# Patient Record
Sex: Male | Born: 1960 | Race: White | Hispanic: No | Marital: Married | Smoking: Never smoker
Health system: Southern US, Community
[De-identification: ages and names within clinical notes are randomized; demographics above are authoritative.]

## PROBLEM LIST (undated history)

## (undated) DIAGNOSIS — Z87442 Personal history of urinary calculi: Secondary | ICD-10-CM

## (undated) DIAGNOSIS — Z8619 Personal history of other infectious and parasitic diseases: Secondary | ICD-10-CM

## (undated) DIAGNOSIS — F419 Anxiety disorder, unspecified: Secondary | ICD-10-CM

## (undated) DIAGNOSIS — M1990 Unspecified osteoarthritis, unspecified site: Secondary | ICD-10-CM

## (undated) DIAGNOSIS — K219 Gastro-esophageal reflux disease without esophagitis: Secondary | ICD-10-CM

## (undated) DIAGNOSIS — M419 Scoliosis, unspecified: Secondary | ICD-10-CM

## (undated) DIAGNOSIS — F909 Attention-deficit hyperactivity disorder, unspecified type: Secondary | ICD-10-CM

## (undated) DIAGNOSIS — Z9189 Other specified personal risk factors, not elsewhere classified: Secondary | ICD-10-CM

## (undated) DIAGNOSIS — I10 Essential (primary) hypertension: Secondary | ICD-10-CM

## (undated) DIAGNOSIS — K222 Esophageal obstruction: Secondary | ICD-10-CM

## (undated) DIAGNOSIS — M25561 Pain in right knee: Secondary | ICD-10-CM

## (undated) HISTORY — PX: INGUINAL HERNIA REPAIR: SUR1180

---

## 1988-01-24 HISTORY — PX: OTHER SURGICAL HISTORY: SHX169

## 2001-01-23 HISTORY — PX: KNEE ARTHROSCOPY: SUR90

## 2001-04-11 ENCOUNTER — Encounter: Payer: Self-pay | Admitting: Orthopedic Surgery

## 2001-04-11 ENCOUNTER — Ambulatory Visit (HOSPITAL_COMMUNITY): Admission: RE | Admit: 2001-04-11 | Discharge: 2001-04-11 | Payer: Self-pay | Admitting: Orthopedic Surgery

## 2003-01-09 ENCOUNTER — Ambulatory Visit (HOSPITAL_COMMUNITY): Admission: RE | Admit: 2003-01-09 | Discharge: 2003-01-09 | Payer: Self-pay | Admitting: Gastroenterology

## 2009-07-28 ENCOUNTER — Encounter: Admission: RE | Admit: 2009-07-28 | Discharge: 2009-07-28 | Payer: Self-pay | Admitting: Family Medicine

## 2009-08-10 ENCOUNTER — Encounter: Admission: RE | Admit: 2009-08-10 | Discharge: 2009-08-10 | Payer: Self-pay | Admitting: Family Medicine

## 2009-12-29 ENCOUNTER — Encounter
Admission: RE | Admit: 2009-12-29 | Discharge: 2009-12-29 | Payer: Self-pay | Source: Home / Self Care | Admitting: Gastroenterology

## 2010-06-10 NOTE — Op Note (Signed)
NAME:  Mitchell Bell, Mitchell Bell                      ACCOUNT NO.:  [id number]   MEDICAL RECORD NO.:  [id number]                   PATIENT TYPE:  AMB   LOCATION:  ENDO                                 FACILITY:  [facility]   PHYSICIAN:  Graylin Shiver, M.D.                DATE OF BIRTH:  02/20/1960   DATE OF PROCEDURE:  01/09/2003  DATE OF DISCHARGE:                                 OPERATIVE REPORT   PROCEDURE:  Colonoscopy.   INDICATIONS FOR PROCEDURE:  Family history of colon cancer. Informed consent  was obtained after explanation of the risks of bleeding, infection and  perforation.   PREMEDICATIONS:  Fentanyl 70 micrograms IV, Versed 7 mg IV.   DESCRIPTION OF PROCEDURE:  With the patient in the left lateral decubitus  position a rectal examination  was performed and no masses were felt.   The Olympus colonoscope was inserted into the rectum and advanced  around  the colon to the cecum. The cecal landmarks were identified. The cecum and  ascending colon were normal. The transverse colon was normal. The descending  colon and sigmoid revealed a few scattered diverticula. The rectum was  normal. The patient tolerated the procedure well without complications.   IMPRESSION:  Mild diverticulosis of the left colon.                                               Graylin Shiver, M.D.    SFG/MEDQ  D:  01/09/2003  T:  01/10/2003  Job:  [id number]   cc:   Dellis Anes. Idell Pickles, M.D.  [address]  [place]  [place] [address]  Fax: [phone]

## 2011-09-12 IMAGING — CR DG LUMBAR SPINE 2-3V
3 series · 3 of 3 positions shown · non-contrast
Comparison: None.

CLINICAL DATA: Low back pain.

LUMBAR SPINE - 2-3 VIEW

[t l-spine a.p.]
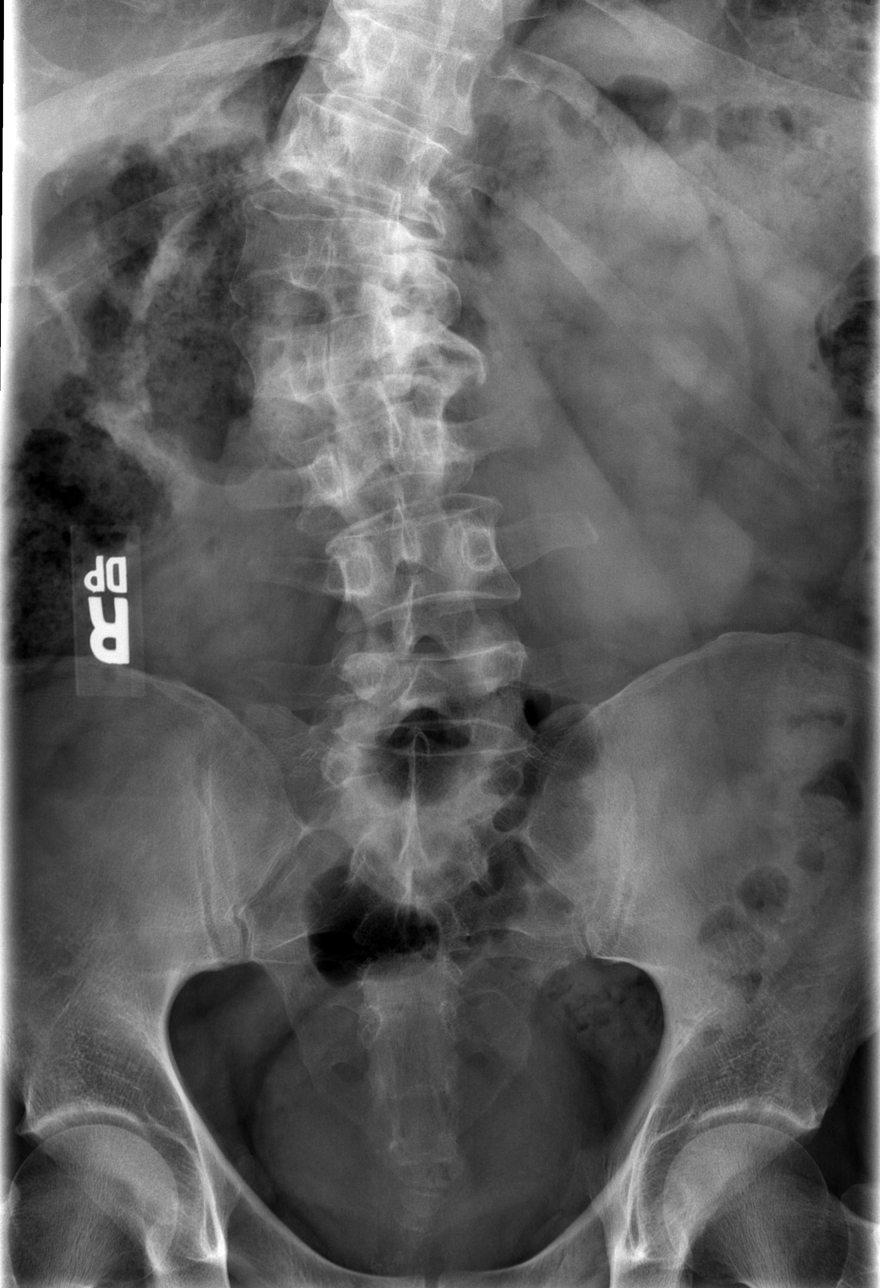

[t l-spine lat]
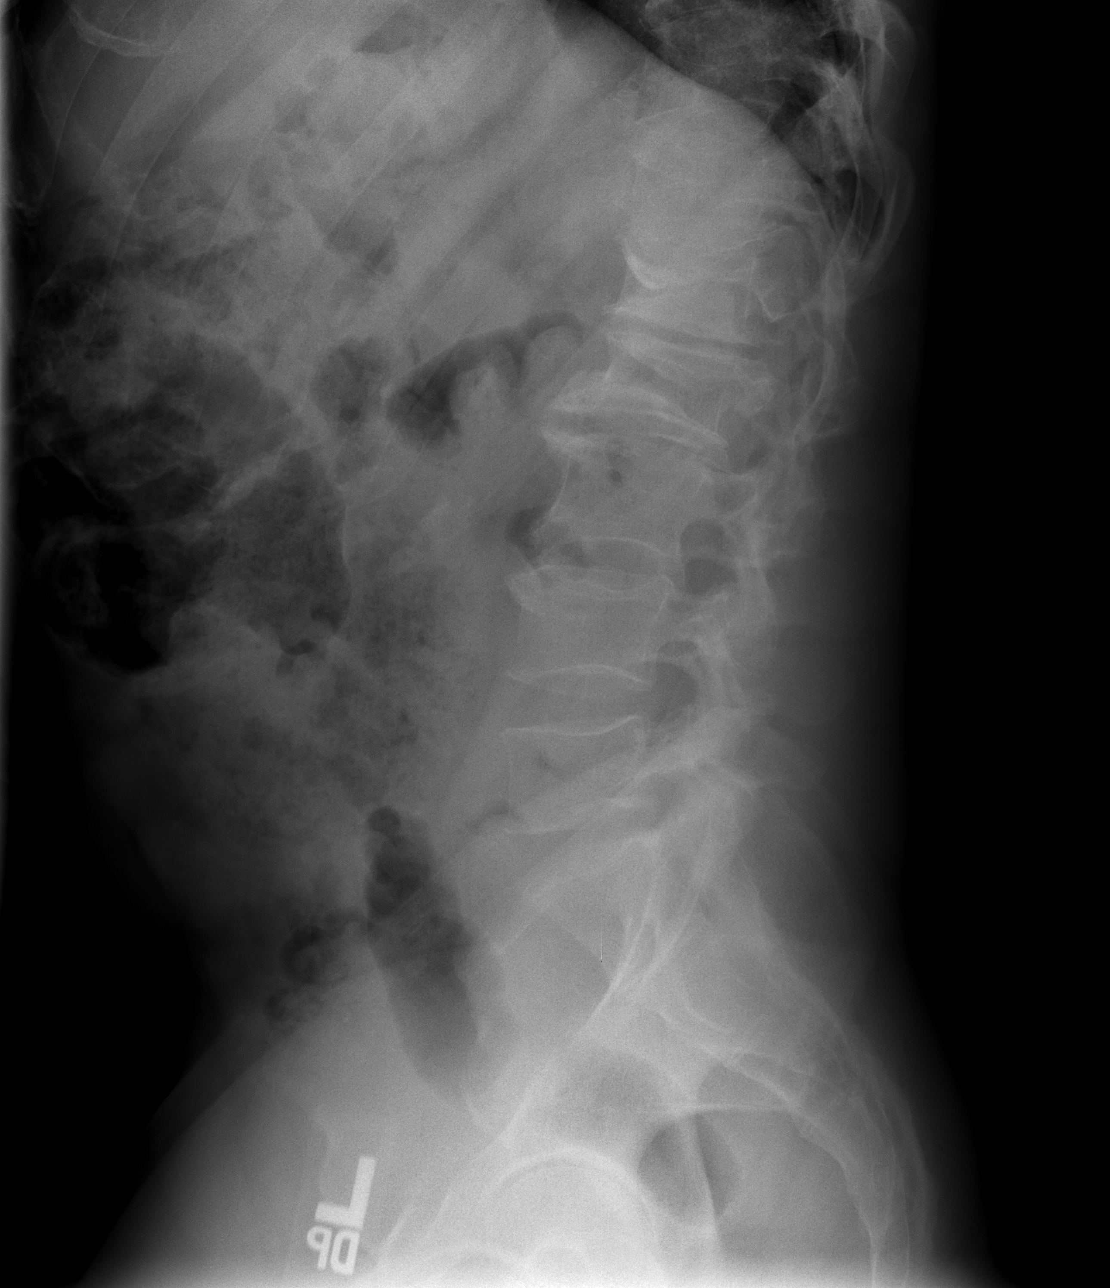

[t l-spine l5-s1 spot]
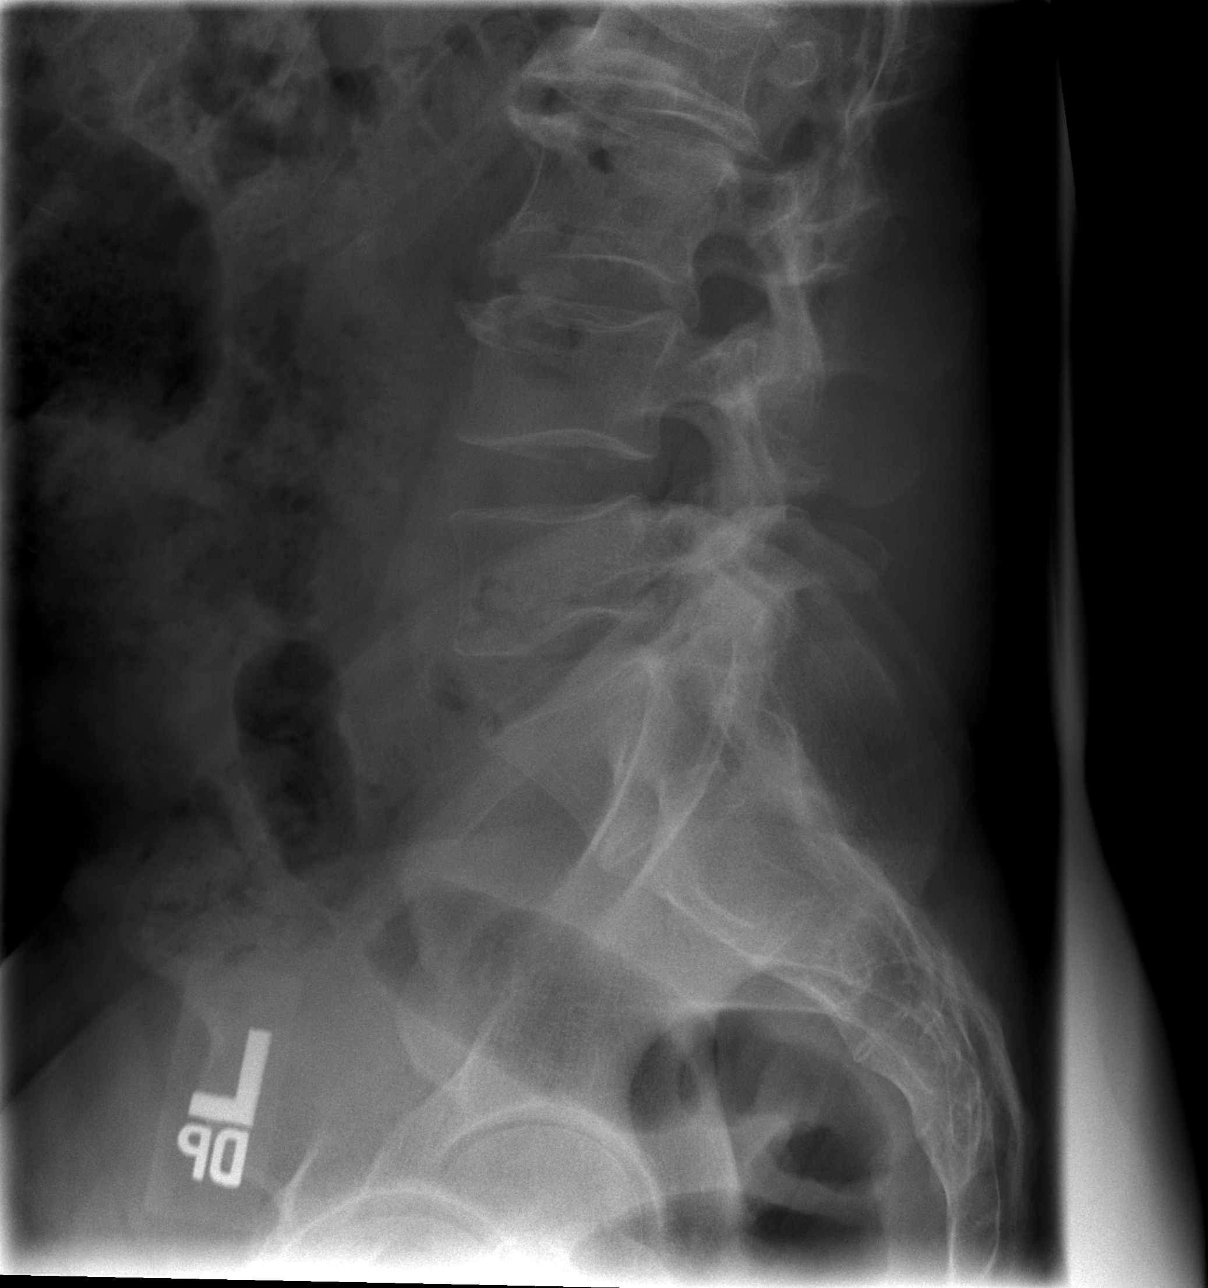

[3 of 3 positions shown; findings below may reference images not displayed]

FINDINGS: Five non-rib bearing lumbar vertebrae are seen with
slight to moderate dextroscoliosis thoraco-lumbar spine junction.
Chronic-appearing wedging L1 and L2 vertebrae without retropulsion
is seen.  Slight to moderate degenerative disc space narrowing is
seen at L1-2 and lesser L2-3 and slight of L3-4.  Remaining disc
spaces and posterior vertebral alignment are normally maintained.
IMPRESSION: 1.  Slight to moderate dextroscoliosis thoracolumbar spine junction
with chronic-appearing wedging L1 and L2 vertebrae without
retropulsion.
2.  Degenerative disc disease maximal L1-2 and L2-3 and lesser L3-
4.
3.  No acute findings.

## 2013-07-02 ENCOUNTER — Inpatient Hospital Stay: Admit: 2013-07-02 | Payer: Self-pay | Admitting: Orthopedic Surgery

## 2013-07-02 SURGERY — ARTHROPLASTY, KNEE, BILATERAL, TOTAL
Anesthesia: Choice | Site: Knee | Laterality: Bilateral

## 2013-08-21 ENCOUNTER — Other Ambulatory Visit: Payer: Self-pay | Admitting: Orthopedic Surgery

## 2013-09-03 ENCOUNTER — Encounter (HOSPITAL_COMMUNITY): Payer: Self-pay | Admitting: Pharmacy Technician

## 2013-09-05 ENCOUNTER — Ambulatory Visit (HOSPITAL_COMMUNITY)
Admission: RE | Admit: 2013-09-05 | Discharge: 2013-09-05 | Disposition: A | Discharge status: Home / Self Care | Payer: BC Managed Care – PPO | Source: Ambulatory Visit | Attending: Anesthesiology | Admitting: Anesthesiology

## 2013-09-05 ENCOUNTER — Encounter (HOSPITAL_COMMUNITY): Payer: Self-pay

## 2013-09-05 ENCOUNTER — Encounter (HOSPITAL_COMMUNITY)
Admission: RE | Admit: 2013-09-05 | Discharge: 2013-09-05 | Disposition: A | Discharge status: Home / Self Care | Payer: BC Managed Care – PPO | Source: Ambulatory Visit | Attending: Orthopedic Surgery | Admitting: Orthopedic Surgery

## 2013-09-05 DIAGNOSIS — Z01812 Encounter for preprocedural laboratory examination: Secondary | ICD-10-CM | POA: Insufficient documentation

## 2013-09-05 DIAGNOSIS — IMO0002 Reserved for concepts with insufficient information to code with codable children: Secondary | ICD-10-CM | POA: Insufficient documentation

## 2013-09-05 DIAGNOSIS — Z01818 Encounter for other preprocedural examination: Secondary | ICD-10-CM | POA: Insufficient documentation

## 2013-09-05 DIAGNOSIS — M1710 Unilateral primary osteoarthritis, unspecified knee: Secondary | ICD-10-CM | POA: Diagnosis not present

## 2013-09-05 DIAGNOSIS — Z01810 Encounter for preprocedural cardiovascular examination: Secondary | ICD-10-CM | POA: Insufficient documentation

## 2013-09-05 HISTORY — DX: Essential (primary) hypertension: I10

## 2013-09-05 HISTORY — DX: Unspecified osteoarthritis, unspecified site: M19.90

## 2013-09-05 HISTORY — DX: Gastro-esophageal reflux disease without esophagitis: K21.9

## 2013-09-05 HISTORY — DX: Personal history of urinary calculi: Z87.442

## 2013-09-05 HISTORY — DX: Scoliosis, unspecified: M41.9

## 2013-09-05 HISTORY — DX: Anxiety disorder, unspecified: F41.9

## 2013-09-05 HISTORY — DX: Attention-deficit hyperactivity disorder, unspecified type: F90.9

## 2013-09-05 LAB — CBC
HEMATOCRIT: 41.2 % (ref 39.0–52.0)
Hemoglobin: 14.2 g/dL (ref 13.0–17.0)
MCH: 30.4 pg (ref 26.0–34.0)
MCHC: 34.5 g/dL (ref 30.0–36.0)
MCV: 88.2 fL (ref 78.0–100.0)
Platelets: 170 10*3/uL (ref 150–400)
RBC: 4.67 MIL/uL (ref 4.22–5.81)
RDW: 12 % (ref 11.5–15.5)
WBC: 4.3 10*3/uL (ref 4.0–10.5)

## 2013-09-05 LAB — URINALYSIS, ROUTINE W REFLEX MICROSCOPIC
Bilirubin Urine: NEGATIVE
GLUCOSE, UA: NEGATIVE mg/dL
Hgb urine dipstick: NEGATIVE
KETONES UR: NEGATIVE mg/dL
LEUKOCYTES UA: NEGATIVE
NITRITE: NEGATIVE
PROTEIN: NEGATIVE mg/dL
Specific Gravity, Urine: 1.021 (ref 1.005–1.030)
Urobilinogen, UA: 1 mg/dL (ref 0.0–1.0)
pH: 6.5 (ref 5.0–8.0)

## 2013-09-05 LAB — COMPREHENSIVE METABOLIC PANEL
ALT: 23 U/L (ref 0–53)
ANION GAP: 9 (ref 5–15)
AST: 16 U/L (ref 0–37)
Albumin: 3.7 g/dL (ref 3.5–5.2)
Alkaline Phosphatase: 71 U/L (ref 39–117)
BUN: 18 mg/dL (ref 6–23)
CALCIUM: 9.4 mg/dL (ref 8.4–10.5)
CO2: 27 mEq/L (ref 19–32)
Chloride: 103 mEq/L (ref 96–112)
Creatinine, Ser: 0.82 mg/dL (ref 0.50–1.35)
GFR calc non Af Amer: >90 mL/min (ref >90)
Glucose, Bld: 100 mg/dL — ABNORMAL HIGH (ref 70–99)
Potassium: 3.8 mEq/L (ref 3.7–5.3)
Sodium: 139 mEq/L (ref 137–147)
TOTAL PROTEIN: 6.7 g/dL (ref 6.0–8.3)
Total Bilirubin: 0.9 mg/dL (ref 0.3–1.2)

## 2013-09-05 LAB — APTT: aPTT: 32 seconds (ref 24–37)

## 2013-09-05 LAB — SURGICAL PCR SCREEN
MRSA, PCR: NEGATIVE
Staphylococcus aureus: NEGATIVE

## 2013-09-05 LAB — PROTIME-INR
INR: 0.97 (ref 0.00–1.49)
PROTHROMBIN TIME: 12.9 s (ref 11.6–15.2)

## 2013-09-05 NOTE — Pre-Procedure Instructions (Signed)
EKG AND CXR WERE DONE TODAY - PREOP AT [facility]. 

## 2013-09-05 NOTE — Progress Notes (Signed)
09/05/13 0841  OBSTRUCTIVE SLEEP APNEA  Have you ever been diagnosed with sleep apnea through a sleep study? No  Do you snore loudly (loud enough to be heard through closed doors)?  0  Do you often feel tired, fatigued, or sleepy during the daytime? 0  Has anyone observed you stop breathing during your sleep? 0  Do you have, or are you being treated for high blood pressure? 1  BMI more than 35 kg/m2? 0  Age over 565 years old? 1  Neck circumference greater than 40 cm/16 inches? 1  Gender: 1  Obstructive Sleep Apnea Score 4  Score 4 or greater  Results sent to PCP

## 2013-09-05 NOTE — Patient Instructions (Addendum)
BRING LIVING WILL  Y[facility] SURG[facility]ED[facility]  ON:  Wednesday  8/26  REPORT TO  SHORT STAY CENTER AT:  7:30 AM   PLE[facility]E [facility] THE [facility] ENTRANCE AND FOLLOW SIGNS TO SHORT STAY CENTER.  DO NOT EAT OR DRINK ANYTHING AFTER MIDNIGHT THE NIGHT BEFORE YOUR SURGERY.  YOU MAY BRUSH YOUR TEETH, RINSE OUT YOUR MOUTH--BUT NO WATER, NO FOOD, NO CHEWING GUM, NO MINTS, NO CANDIES, NO CHEWING TOBACCO.  PLEASE TAKE THE FOLLOWING MEDICATIONS THE AM OF YOUR SURGERY WITH A FEW SIPS OF WATER:  OMEPRAZOLE, ZOLOFT   DO NOT BRING VALUABLES, MONEY, CREDIT CARDS.  DO NOT WEAR JEWELRY, MAKE-UP, NAIL POLISH AND NO METAL PINS OR CLIPS IN YOUR HAIR. CONTACT LENS, DENTURES / PARTIALS, GLASSES SHOULD NOT BE WORN TO SURGERY AND IN MOST CASES-HEARING AIDS WILL NEED TO BE REMOVED.  BRING YOUR GLASSES CASE, ANY EQUIPMENT NEEDED FOR YOUR CONTACT LENS. FOR PATIENTS ADMITTED TO THE HOSPITAL--CHECK OUT TIME THE DAY OF DISCHARGE IS 11:00 AM.  ALL INPATIENT ROOMS ARE PRIVATE - WITH BATHROOM, TELEPHONE, TELEVISION AND WIFI INTERNET.                                                    PLEASE READ OVER ANY  FACT SHEETS THAT YOU WERE GIVEN: MRSA INFORMATION, BLOOD TRANSFUSION INFORMATION, INCENTIVE SPIROMETER INFORMATION.  PLEASE BE AWARE THAT YOU MAY NEED ADDITIONAL BLOOD DRAWN DAY OF YOUR SURGERY  _______________________________________________________________________   [facility] - Preparing [facility] Before surgery, you can play an important role.  Because skin is not sterile, your skin needs to be as free of germs as possible.  You can reduce the number of germs on your skin by washing with CHG (chlorahexidine gluconate) soap before surgery.  CHG is an antiseptic cleaner which kills germs and bonds with the skin to continue killing germs even after washing. Please DO NOT use if you have an allergy to CHG or antibacterial soaps.  If your skin becomes reddened/irritated stop using  the CHG and inform your nurse when you arrive at Short Stay. Do not shave (including legs and underarms) for at least 48 hours prior to the first CHG shower.  You may shave your face/neck. Please follow these instructions carefully:  1.  Shower with CHG Soap the night before surgery and the  morning of Surgery.  2.  If you choose to wash your hair, wash your hair first as usual with your  normal  shampoo.  3.  After you shampoo, rinse your hair and body thoroughly to remove the  shampoo.                           4.  Use CHG as you would any other liquid soap.  You can apply chg directly  to the skin and wash                       Gently with a scrungie or clean washcloth.  5.  Apply the CHG Soap to your body ONLY FROM THE NECK DOWN.   Do not use on face/ open  Wound or open sores. Avoid contact with eyes, ears mouth and genitals (private parts).                       Wash face,  Genitals (private parts) with your normal soap.             6.  Wash thoroughly, paying special attention to the area where your surgery  will be performed.  7.  Thoroughly rinse your body with warm water from the neck down.  8.  DO NOT shower/wash with your normal soap after using and rinsing off  the CHG Soap.                9.  Pat yourself dry with a clean towel.            10.  Wear clean pajamas.            11.  Place clean sheets on your bed the night of your first shower and do not  sleep with pets. Day of Surgery : Do not apply any lotions/deodorants the morning of surgery.  Please wear clean clothes to the hospital/surgery center.  FAILURE TO FOLLOW THESE INSTRUCTIONS MAY RESULT IN THE CANCELLATION OF YOUR SURGERY PATIENT SIGNATURE_________________________________  NURSE SIGNATURE__________________________________  ________________________________________________________________________   Mitchell Bell  An incentive spirometer is a tool that can help keep your lungs  clear and active. This tool measures how well you are filling your lungs with each breath. Taking long deep breaths may help reverse or decrease the chance of developing breathing (pulmonary) problems (especially infection) following:  A long period of time when you are unable to move or be active. BEFORE THE PROCEDURE   If the spirometer includes an indicator to show your best effort, your nurse or respiratory therapist will set it to a desired goal.  If possible, sit up straight or lean slightly forward. Try not to slouch.  Hold the incentive spirometer in an upright position. INSTRUCTIONS FOR USE  1. Sit on the edge of your bed if possible, or sit up as far as you can in bed or on a chair. 2. Hold the incentive spirometer in an upright position. 3. Breathe out normally. 4. Place the mouthpiece in your mouth and seal your lips tightly around it. 5. Breathe in slowly and as deeply as possible, raising the piston or the ball toward the top of the column. 6. Hold your breath for 3-5 seconds or for as long as possible. Allow the piston or ball to fall to the bottom of the column. 7. Remove the mouthpiece from your mouth and breathe out normally. 8. Rest for a few seconds and repeat Steps 1 through 7 at least 10 times every 1-2 hours when you are awake. Take your time and take a few normal breaths between deep breaths. 9. The spirometer may include an indicator to show your best effort. Use the indicator as a goal to work toward during each repetition. 10. After each set of 10 deep breaths, practice coughing to be sure your lungs are clear. If you have an incision (the cut made at the time of surgery), support your incision when coughing by placing a pillow or rolled up towels firmly against it. Once you are able to get out of bed, walk around indoors and cough well. You may stop using the incentive spirometer when instructed by your caregiver.  RISKS AND COMPLICATIONS  Take your time so you do  not  get dizzy or light-headed.  If you are in pain, you may need to take or ask for pain medication before doing incentive spirometry. It is harder to take a deep breath if you are having pain. AFTER USE  Rest and breathe slowly and easily.  It can be helpful to keep track of a log of your progress. Your caregiver can provide you with a simple table to help with this. If you are using the spirometer at home, follow these instructions: Scraper IF:   You are having difficultly using the spirometer.  You have trouble using the spirometer as often as instructed.  Your pain medication is not giving enough relief while using the spirometer.  You develop fever of 100.5 F (38.1 C) or higher. SEEK IMMEDIATE MEDICAL CARE IF:   You cough up bloody sputum that had not been present before.  You develop fever of 102 F (38.9 C) or greater.  You develop worsening pain at or near the incision site. MAKE SURE YOU:   Understand these instructions.  Will watch your condition.  Will get help right away if you are not doing well or get worse. Document Released: 05/22/2006 Document Revised: 04/03/2011 Document Reviewed: 07/23/2006 Exit[employer] Patient Information 2014 ExitCare, [place].   ________________________________________________________________________  WHAT IS A BLOOD TRANSFUSION? Blood Transfusion Information  A transfusion is the replacement of blood or some of its parts. Blood is made up of multiple cells which provide different functions.  Red blood cells carry oxygen and are used for blood loss replacement.  White blood cells fight against infection.  Platelets control bleeding.  Plasma helps clot blood.  Other blood products are available for specialized needs, such as hemophilia or other clotting disorders. BEFORE THE TRANSFUSION  Who gives blood for transfusions?   Healthy volunteers who are fully evaluated to make sure their blood is safe. This is blood bank  blood. Transfusion therapy is the safest it has ever been in the practice of medicine. Before blood is taken from a donor, a complete history is taken to make sure that person has no history of diseases nor engages in risky social behavior (examples are intravenous drug use or sexual activity with multiple partners). The donor's travel history is screened to minimize risk of transmitting infections, such as malaria. The donated blood is tested for signs of infectious diseases, such as HIV and hepatitis. The blood is then tested to be sure it is compatible with you in order to minimize the chance of a transfusion reaction. If you or a relative donates blood, this is often done in anticipation of surgery and is not appropriate for emergency situations. It takes many days to process the donated blood. RISKS AND COMPLICATIONS Although transfusion therapy is very safe and saves many lives, the main dangers of transfusion include:   Getting an infectious disease.  Developing a transfusion reaction. This is an allergic reaction to something in the blood you were given. Every precaution is taken to prevent this. The decision to have a blood transfusion has been considered carefully by your caregiver before blood is given. Blood is not given unless the benefits outweigh the risks. AFTER THE TRANSFUSION  Right after receiving a blood transfusion, you will usually feel much better and more energetic. This is especially true if your red blood cells have gotten low (anemic). The transfusion raises the level of the red blood cells which carry oxygen, and this usually causes an energy increase.  The nurse administering the transfusion will  monitor you carefully for complications. HOME CARE INSTRUCTIONS  No special instructions are needed after a transfusion. You may find your energy is better. Speak with your caregiver about any limitations on activity for underlying diseases you may have. SEEK MEDICAL CARE IF:    Your condition is not improving after your transfusion.  You develop redness or irritation at the intravenous (IV) site. SEEK IMMEDIATE MEDICAL CARE IF:  Any of the following symptoms occur over the next 12 hours:  Shaking chills.  You have a temperature by mouth above 102 F (38.9 C), not controlled by medicine.  Chest, back, or muscle pain.  People around you feel you are not acting correctly or are confused.  Shortness of breath or difficulty breathing.  Dizziness and fainting.  You get a rash or develop hives.  You have a decrease in urine output.  Your urine turns a dark color or changes to pink, red, or brown. Any of the following symptoms occur over the next 10 days:  You have a temperature by mouth above 102 F (38.9 C), not controlled by medicine.  Shortness of breath.  Weakness after normal activity.  The white part of the eye turns yellow (jaundice).  You have a decrease in the amount of urine or are urinating less often.  Your urine turns a dark color or changes to pink, red, or brown. Document Released: 01/07/2000 Document Revised: 04/03/2011 Document Reviewed: 08/26/2007 [facility] Patient Information 2014 [place], [place].  _______________________________________________________________________

## 2013-09-16 ENCOUNTER — Other Ambulatory Visit: Payer: Self-pay | Admitting: Orthopedic Surgery

## 2013-09-16 NOTE — H&P (Signed)
Alma Downs DOB: 11-01-1960 Married / Language: [name] / Race: White Male Date of Admission:  09/17/2013 Chief Complaint:  Bilateral Knee Pain History of Present Illness  The patient is a 53 year old male who comes in for a preoperative history and physical. The patient is scheduled for a bilateral total knee arthroplasty to be performed by Dr. Gus Rankin. Aluisio, MD at [facility] on 09/17/2013. The patient is a 53 year old male who presents for follow up of their knee. The patient is being followed for their bilateral knee pain and osteoarthritis. They are now several month(s) out from Synvisc injections in the left and cortisone in the right knee. Symptoms reported include: pain, aching and stiffness, while the patient does not report symptoms of: giving way. The patient feels that they are doing poorly and report their pain level to be moderate. The following medication has been used for pain control: none. The patient has reported improvement of their symptoms with: Cortisone injections (but it does not last long) and viscosupplementation (but it did not last). The patient indicates that they have questions or concerns today regarding surgery. [name] is at a stage now where the knees are bothering him at all times. It is limiting what he can and cannot do. He still works standing on concrete and despite using good shoes and being on a rubber mat, he does have a lot of pain at work. He has not had benefit from viscosupplementation at all. Cortisone helps, but only lasts for a few weeks. Left knee is somewhat worse than the right, but the right one has become terribly symptomatic also. He is at a stage now where he cannot tolerate the pain and dysfunction anymore, and he is ready to have the knees fixed. They have been treated conservatively in the past for the above stated problem and despite conservative measures, they continue to have progressive pain and severe functional limitations and  dysfunction. They have failed non-operative management including home exercise, medications, and injections. It is felt that they would benefit from undergoing bilateral total joint replacement. Risks and benefits of the procedure have been discussed with the patient and they elect to proceed with surgery. There are no active contraindications to surgery such as ongoing infection or rapidly progressive neurological disease.  Allergies No Known Drug Allergies  Problem List/Past Medical Primary osteoarthritis of both knees (715.16  M17.0) Hypercholesterolemia Kidney Stone Anxiety Disorder Gastroesophageal Reflux Disease High blood pressure  Family History Cancer grandmother fathers side Hypertension father Chronic Obstructive Lung Disease First Degree Relatives. father  Social History Drug/Alcohol Rehab (Currently) no Alcohol use current drinker; drinks beer, wine and hard liquor; less than 5 per week Exercise Exercises weekly; does running / walking and other Drug/Alcohol Rehab (Previously) no Number of flights of stairs before winded 2-3 Marital status married Living situation live with spouse Illicit drug use no Current work status working full time Children 2 Tobacco use Never smoker. never smoker Tobacco / smoke exposure yes Pain Contract no  Medication History Meloxicam (  Tablet, 1 (one) Tablet Oral daily, Taken starting 03/14/2013) Active. Losartan Potassium (  Tablet, Oral) Active. Strattera (  Capsule, Oral) Active. Zoloft (  Tablet, Oral) Active. Simvastatin (  Tablet, Oral) Active. Aspirin EC (  Tablet DR, Oral) Active. Omega-3 Complex (192-251-11MG -MG-UNIT Capsule, Oral) Active. Hydrochlorothiazide (12.5MG  Tablet, Oral) Active.  Past Surgical History Ankle Surgery left Arthroscopy of Knee bilateral Inguinal Hernia Repair open: left  Review of Systems  General Not Present- Chills, Fatigue, Fever, Memory  Loss, Night Sweats, Weight Gain and Weight Loss. Skin Not Present- Eczema, Hives, Itching, Lesions and Rash. HEENT Not Present- Dentures, Double Vision, Headache, Hearing Loss, Tinnitus and Visual Loss. Respiratory Not Present- Allergies, Chronic Cough, Coughing up blood, Shortness of breath at rest and Shortness of breath with exertion. Cardiovascular Not Present- Chest Pain, Difficulty Breathing Lying Down, Murmur, Palpitations, Racing/skipping heartbeats and Swelling. Gastrointestinal Not Present- Abdominal Pain, Bloody Stool, Constipation, Diarrhea, Difficulty Swallowing, Heartburn, Jaundice, Loss of appetitie, Nausea and Vomiting. Male Genitourinary Not Present- Blood in Urine, Discharge, Flank Pain, Incontinence, Painful Urination, Urgency, Urinary frequency, Urinary Retention, Urinating at Night and Weak urinary stream. Musculoskeletal Not Present- Back Pain, Joint Pain, Joint Swelling, Morning Stiffness, Muscle Pain, Muscle Weakness and Spasms. Neurological Not Present- Blackout spells, Difficulty with balance, Dizziness, Paralysis, Tremor and Weakness. Psychiatric Not Present- Insomnia.   Vitals  BP: 118/82 (Sitting, Right Arm, Standard)   Physical Exam ( The physical exam findings are as follows: Note:Patient is a 53 year old male with continued bilateral knee pain. Patient is accompanied today by his wife Angelique Blonder.  General Mental Status -Alert, cooperative and good historian. General Appearance-pleasant, Not in acute distress. Orientation-Oriented X3. Build & Nutrition-Well nourished and Well developed.  Head and Neck Head-normocephalic, atraumatic . Neck Global Assessment - supple, no bruit auscultated on the right, no bruit auscultated on the left.  Eye Vision-Wears corrective lenses. Pupil - Bilateral-Regular and Round. Motion - Bilateral-EOMI.  Chest and Lung Exam Auscultation Breath sounds - clear at anterior chest wall and clear at posterior  chest wall. Adventitious sounds - No Adventitious sounds.  Cardiovascular Auscultation Rhythm - Regular rate and rhythm. Heart Sounds - S1 WNL and S2 WNL. Murmurs & Other Heart Sounds - Auscultation of the heart reveals - No Murmurs.  Abdomen Palpation/Percussion Tenderness - Abdomen is non-tender to palpation. Rigidity (guarding) - Abdomen is soft. Auscultation Auscultation of the abdomen reveals - Bowel sounds normal.  Male Genitourinary Note: Not done, not pertinent to present illness   Musculoskeletal Note: Well developed male alert and oriented in no apparent distress. His hips show normal motion with no discomfort. Both knees show no effusion. He has slight varus deformities of both. Left knee range is about 5-125 degrees. Marked crepitus on range of motion. Tender medial greater than lateral with no instability.  Right knee range about 5-125 degrees with marked crepitus on range of motion. Tender medial greater than lateral with no instability. Pulses, sensation, and motor intact in both lower extremities.  RADIOGRAPHS AP of both knees and lateral taken today show that he has bone on bone arthritis of the medial and patellofemoral compartments of both knees.   Assessment & Plan Primary osteoarthritis of both knees (715.16  M17.0) Note:Plan is for a Bilateral Total Knee Replacements by Dr. Lequita Halt.  Plan is to go home.  PCP - Dr. Catha Gosselin  The patient does not have any contraindications and will receive TXA (tranexamic acid) prior to surgery.  Signed electronically by Lauraine Rinne, III PA-C

## 2013-09-17 ENCOUNTER — Encounter (HOSPITAL_COMMUNITY): Payer: BC Managed Care – PPO | Admitting: Anesthesiology

## 2013-09-17 ENCOUNTER — Encounter (HOSPITAL_COMMUNITY)
Admission: RE | Disposition: A | Discharge status: Home Health | Payer: Self-pay | Source: Ambulatory Visit | Attending: Orthopedic Surgery

## 2013-09-17 ENCOUNTER — Encounter (HOSPITAL_COMMUNITY): Payer: Self-pay | Admitting: *Deleted

## 2013-09-17 ENCOUNTER — Inpatient Hospital Stay (HOSPITAL_COMMUNITY): Payer: BC Managed Care – PPO | Admitting: Anesthesiology

## 2013-09-17 ENCOUNTER — Inpatient Hospital Stay (HOSPITAL_COMMUNITY)
Admission: RE | Admit: 2013-09-17 | Discharge: 2013-09-22 | DRG: 462 | Disposition: A | Discharge status: Home Health | Payer: BC Managed Care – PPO | Source: Ambulatory Visit | Attending: Orthopedic Surgery | Admitting: Orthopedic Surgery

## 2013-09-17 DIAGNOSIS — Z87442 Personal history of urinary calculi: Secondary | ICD-10-CM

## 2013-09-17 DIAGNOSIS — Z836 Family history of other diseases of the respiratory system: Secondary | ICD-10-CM

## 2013-09-17 DIAGNOSIS — Z79899 Other long term (current) drug therapy: Secondary | ICD-10-CM | POA: Diagnosis not present

## 2013-09-17 DIAGNOSIS — Z809 Family history of malignant neoplasm, unspecified: Secondary | ICD-10-CM

## 2013-09-17 DIAGNOSIS — F411 Generalized anxiety disorder: Secondary | ICD-10-CM | POA: Diagnosis present

## 2013-09-17 DIAGNOSIS — F909 Attention-deficit hyperactivity disorder, unspecified type: Secondary | ICD-10-CM | POA: Diagnosis present

## 2013-09-17 DIAGNOSIS — E7800 Pure hypercholesterolemia, unspecified: Secondary | ICD-10-CM | POA: Diagnosis present

## 2013-09-17 DIAGNOSIS — M179 Osteoarthritis of knee, unspecified: Secondary | ICD-10-CM | POA: Diagnosis present

## 2013-09-17 DIAGNOSIS — Z8249 Family history of ischemic heart disease and other diseases of the circulatory system: Secondary | ICD-10-CM | POA: Diagnosis not present

## 2013-09-17 DIAGNOSIS — K219 Gastro-esophageal reflux disease without esophagitis: Secondary | ICD-10-CM | POA: Diagnosis present

## 2013-09-17 DIAGNOSIS — D62 Acute posthemorrhagic anemia: Secondary | ICD-10-CM | POA: Diagnosis not present

## 2013-09-17 DIAGNOSIS — M1710 Unilateral primary osteoarthritis, unspecified knee: Principal | ICD-10-CM | POA: Diagnosis present

## 2013-09-17 DIAGNOSIS — I10 Essential (primary) hypertension: Secondary | ICD-10-CM | POA: Diagnosis present

## 2013-09-17 DIAGNOSIS — M4120 Other idiopathic scoliosis, site unspecified: Secondary | ICD-10-CM | POA: Diagnosis present

## 2013-09-17 DIAGNOSIS — M25569 Pain in unspecified knee: Secondary | ICD-10-CM | POA: Diagnosis present

## 2013-09-17 DIAGNOSIS — Z7982 Long term (current) use of aspirin: Secondary | ICD-10-CM | POA: Diagnosis not present

## 2013-09-17 HISTORY — PX: TOTAL KNEE ARTHROPLASTY: SHX125

## 2013-09-17 LAB — TYPE AND SCREEN
ABO/RH(D): O POS
Antibody Screen: NEGATIVE

## 2013-09-17 LAB — ABO/RH: ABO/RH(D): O POS

## 2013-09-17 SURGERY — ARTHROPLASTY, KNEE, BILATERAL, TOTAL
Anesthesia: Epidural | Site: Knee | Laterality: Bilateral

## 2013-09-17 MED ORDER — METHOCARBAMOL 500 MG PO TABS
500.0000 mg | ORAL_TABLET | Freq: Four times a day (QID) | ORAL | Status: DC | PRN
  Administered 2013-09-19 (×2): 500 mg via ORAL
  Filled 2013-09-17 (×2): qty 1

## 2013-09-17 MED ORDER — ACETAMINOPHEN 650 MG RE SUPP: 650.0000 mg | Freq: Four times a day (QID) | RECTAL | Status: DC | PRN

## 2013-09-17 MED ORDER — ONDANSETRON HCL 4 MG/2ML IJ SOLN
INTRAMUSCULAR | Status: DC | PRN
  Administered 2013-09-17: 4 mg via INTRAVENOUS

## 2013-09-17 MED ORDER — DIPHENHYDRAMINE HCL 12.5 MG/5ML PO ELIX: 12.5000 mg | ORAL_SOLUTION | ORAL | Status: DC | PRN

## 2013-09-17 MED ORDER — HYDROMORPHONE HCL PF 1 MG/ML IJ SOLN
0.2500 mg | INTRAMUSCULAR | Status: DC | PRN
  Administered 2013-09-17 (×4): 0.25 mg via INTRAVENOUS

## 2013-09-17 MED ORDER — METOCLOPRAMIDE HCL 5 MG/ML IJ SOLN: 5.0000 mg | Freq: Three times a day (TID) | INTRAMUSCULAR | Status: DC | PRN

## 2013-09-17 MED ORDER — ONDANSETRON HCL 4 MG/2ML IJ SOLN
INTRAMUSCULAR | Status: AC
  Filled 2013-09-17: qty 2

## 2013-09-17 MED ORDER — OXYCODONE HCL 5 MG PO TABS
5.0000 mg | ORAL_TABLET | ORAL | Status: DC | PRN
  Administered 2013-09-17 – 2013-09-18 (×6): 5 mg via ORAL
  Administered 2013-09-19: 20 mg via ORAL
  Administered 2013-09-19: 10 mg via ORAL
  Administered 2013-09-19: 20 mg via ORAL
  Administered 2013-09-19: 10 mg via ORAL
  Administered 2013-09-19: 15 mg via ORAL
  Administered 2013-09-20 – 2013-09-22 (×19): 20 mg via ORAL
  Filled 2013-09-17: qty 2
  Filled 2013-09-17 (×7): qty 4
  Filled 2013-09-17: qty 1
  Filled 2013-09-17: qty 4
  Filled 2013-09-17: qty 1
  Filled 2013-09-17: qty 4
  Filled 2013-09-17: qty 1
  Filled 2013-09-17 (×2): qty 4
  Filled 2013-09-17: qty 2
  Filled 2013-09-17 (×6): qty 4
  Filled 2013-09-17: qty 1
  Filled 2013-09-17: qty 4
  Filled 2013-09-17: qty 1
  Filled 2013-09-17: qty 3
  Filled 2013-09-17: qty 1
  Filled 2013-09-17 (×4): qty 4

## 2013-09-17 MED ORDER — PROPOFOL 10 MG/ML IV BOLUS
INTRAVENOUS | Status: AC
  Filled 2013-09-17: qty 20

## 2013-09-17 MED ORDER — FENTANYL CITRATE 0.05 MG/ML IJ SOLN
INTRAMUSCULAR | Status: AC
  Filled 2013-09-17: qty 2

## 2013-09-17 MED ORDER — DEXAMETHASONE 4 MG PO TABS
10.0000 mg | ORAL_TABLET | Freq: Every day | ORAL | Status: AC
  Administered 2013-09-18: 10 mg via ORAL
  Filled 2013-09-17: qty 1

## 2013-09-17 MED ORDER — BISACODYL 10 MG RE SUPP: 10.0000 mg | Freq: Every day | RECTAL | Status: DC | PRN

## 2013-09-17 MED ORDER — LACTATED RINGERS IV SOLN
INTRAVENOUS | Status: DC | PRN
  Administered 2013-09-17 (×2): via INTRAVENOUS

## 2013-09-17 MED ORDER — POLYETHYLENE GLYCOL 3350 17 G PO PACK
17.0000 g | PACK | Freq: Every day | ORAL | Status: DC | PRN
  Administered 2013-09-19 – 2013-09-20 (×2): 17 g via ORAL

## 2013-09-17 MED ORDER — CEFAZOLIN SODIUM-DEXTROSE 2-3 GM-% IV SOLR
INTRAVENOUS | Status: AC
  Filled 2013-09-17: qty 50

## 2013-09-17 MED ORDER — MIDAZOLAM HCL 2 MG/2ML IJ SOLN
INTRAMUSCULAR | Status: AC
  Filled 2013-09-17: qty 2

## 2013-09-17 MED ORDER — HYDROMORPHONE HCL PF 1 MG/ML IJ SOLN
INTRAMUSCULAR | Status: AC
  Filled 2013-09-17: qty 1

## 2013-09-17 MED ORDER — MENTHOL 3 MG MT LOZG
1.0000 | LOZENGE | OROMUCOSAL | Status: DC | PRN
  Filled 2013-09-17: qty 9

## 2013-09-17 MED ORDER — BUPIVACAINE HCL (PF) 0.25 % IJ SOLN
INTRAMUSCULAR | Status: AC
  Filled 2013-09-17: qty 30

## 2013-09-17 MED ORDER — PROMETHAZINE HCL 25 MG/ML IJ SOLN
6.2500 mg | INTRAMUSCULAR | Status: DC | PRN
Start: 2013-09-17 — End: 2013-09-17

## 2013-09-17 MED ORDER — ATOMOXETINE HCL 40 MG PO CAPS
80.0000 mg | ORAL_CAPSULE | Freq: Every morning | ORAL | Status: DC
  Administered 2013-09-18 – 2013-09-22 (×5): 80 mg via ORAL
  Filled 2013-09-17 (×5): qty 2

## 2013-09-17 MED ORDER — ONDANSETRON HCL 4 MG/2ML IJ SOLN: 4.0000 mg | Freq: Four times a day (QID) | INTRAMUSCULAR | Status: DC | PRN

## 2013-09-17 MED ORDER — SERTRALINE HCL 50 MG PO TABS
50.0000 mg | ORAL_TABLET | Freq: Every morning | ORAL | Status: DC
  Administered 2013-09-18 – 2013-09-22 (×5): 50 mg via ORAL
  Filled 2013-09-17 (×5): qty 1

## 2013-09-17 MED ORDER — DEXAMETHASONE SODIUM PHOSPHATE 10 MG/ML IJ SOLN
INTRAMUSCULAR | Status: AC
  Filled 2013-09-17: qty 1

## 2013-09-17 MED ORDER — ACETAMINOPHEN 500 MG PO TABS
1000.0000 mg | ORAL_TABLET | Freq: Four times a day (QID) | ORAL | Status: AC
  Administered 2013-09-17 – 2013-09-18 (×4): 1000 mg via ORAL
  Filled 2013-09-17 (×4): qty 2

## 2013-09-17 MED ORDER — ROPIVACAINE HCL 2 MG/ML IJ SOLN
10.0000 mL/h | INTRAMUSCULAR | Status: DC
  Administered 2013-09-18 (×2): 10 mL/h via EPIDURAL
  Filled 2013-09-17 (×5): qty 200

## 2013-09-17 MED ORDER — MORPHINE SULFATE 2 MG/ML IJ SOLN
1.0000 mg | INTRAMUSCULAR | Status: DC | PRN
  Administered 2013-09-19: 2 mg via INTRAVENOUS
  Administered 2013-09-19: 1 mg via INTRAVENOUS
  Administered 2013-09-19 – 2013-09-21 (×8): 2 mg via INTRAVENOUS
  Filled 2013-09-17 (×10): qty 1

## 2013-09-17 MED ORDER — METOCLOPRAMIDE HCL 10 MG PO TABS: 5.0000 mg | ORAL_TABLET | Freq: Three times a day (TID) | ORAL | Status: DC | PRN

## 2013-09-17 MED ORDER — DEXAMETHASONE SODIUM PHOSPHATE 10 MG/ML IJ SOLN
10.0000 mg | Freq: Every day | INTRAMUSCULAR | Status: AC
  Filled 2013-09-17: qty 1

## 2013-09-17 MED ORDER — SODIUM CHLORIDE 0.9 % IV SOLN: INTRAVENOUS | Status: DC

## 2013-09-17 MED ORDER — SIMVASTATIN 40 MG PO TABS
40.0000 mg | ORAL_TABLET | Freq: Every day | ORAL | Status: DC
  Administered 2013-09-17 – 2013-09-21 (×5): 40 mg via ORAL
  Filled 2013-09-17 (×6): qty 1

## 2013-09-17 MED ORDER — LOSARTAN POTASSIUM 50 MG PO TABS
50.0000 mg | ORAL_TABLET | Freq: Every morning | ORAL | Status: DC
  Administered 2013-09-18 – 2013-09-22 (×5): 50 mg via ORAL
  Filled 2013-09-17 (×5): qty 1

## 2013-09-17 MED ORDER — POTASSIUM CHLORIDE IN NACL 20-0.9 MEQ/L-% IV SOLN
INTRAVENOUS | Status: DC
  Administered 2013-09-17 – 2013-09-19 (×4): via INTRAVENOUS
  Filled 2013-09-17 (×9): qty 1000

## 2013-09-17 MED ORDER — CHLORHEXIDINE GLUCONATE 4 % EX LIQD: 60.0000 mL | Freq: Once | CUTANEOUS | Status: DC

## 2013-09-17 MED ORDER — SODIUM CHLORIDE 0.9 % IR SOLN
Status: DC | PRN
Start: 2013-09-17 — End: 2013-09-17
  Administered 2013-09-17: 3000 mL

## 2013-09-17 MED ORDER — LIDOCAINE HCL 2 % IJ SOLN
INTRAMUSCULAR | Status: AC
  Filled 2013-09-17: qty 20

## 2013-09-17 MED ORDER — PANTOPRAZOLE SODIUM 40 MG PO TBEC
40.0000 mg | DELAYED_RELEASE_TABLET | Freq: Every day | ORAL | Status: DC
  Filled 2013-09-17: qty 1

## 2013-09-17 MED ORDER — DOCUSATE SODIUM 100 MG PO CAPS
100.0000 mg | ORAL_CAPSULE | Freq: Two times a day (BID) | ORAL | Status: DC
  Administered 2013-09-17 – 2013-09-22 (×10): 100 mg via ORAL

## 2013-09-17 MED ORDER — CEFAZOLIN SODIUM-DEXTROSE 2-3 GM-% IV SOLR
2.0000 g | INTRAVENOUS | Status: AC
  Administered 2013-09-17: 2 g via INTRAVENOUS

## 2013-09-17 MED ORDER — METHOCARBAMOL 1000 MG/10ML IJ SOLN
500.0000 mg | Freq: Four times a day (QID) | INTRAVENOUS | Status: DC | PRN
  Administered 2013-09-17: 500 mg via INTRAVENOUS
  Filled 2013-09-17: qty 5

## 2013-09-17 MED ORDER — ACETAMINOPHEN 10 MG/ML IV SOLN
1000.0000 mg | Freq: Once | INTRAVENOUS | Status: AC
  Administered 2013-09-17: 1000 mg via INTRAVENOUS
  Filled 2013-09-17: qty 100

## 2013-09-17 MED ORDER — HYDROCHLOROTHIAZIDE 12.5 MG PO CAPS
12.5000 mg | ORAL_CAPSULE | Freq: Every morning | ORAL | Status: DC
  Administered 2013-09-18 – 2013-09-22 (×5): 12.5 mg via ORAL
  Filled 2013-09-17 (×5): qty 1

## 2013-09-17 MED ORDER — PHENOL 1.4 % MT LIQD
1.0000 | OROMUCOSAL | Status: DC | PRN
Start: 2013-09-17 — End: 2013-09-22

## 2013-09-17 MED ORDER — LIDOCAINE-EPINEPHRINE (PF) 2 %-1:200000 IJ SOLN
INTRAMUSCULAR | Status: DC | PRN
  Administered 2013-09-17 (×4): 5 mL via INTRADERMAL

## 2013-09-17 MED ORDER — TRANEXAMIC ACID 100 MG/ML IV SOLN
1000.0000 mg | INTRAVENOUS | Status: AC
  Administered 2013-09-17: 1000 mg via INTRAVENOUS
  Filled 2013-09-17: qty 10

## 2013-09-17 MED ORDER — CEFAZOLIN SODIUM-DEXTROSE 2-3 GM-% IV SOLR
2.0000 g | Freq: Four times a day (QID) | INTRAVENOUS | Status: AC
  Administered 2013-09-17 (×2): 2 g via INTRAVENOUS
  Filled 2013-09-17 (×2): qty 50

## 2013-09-17 MED ORDER — DEXAMETHASONE SODIUM PHOSPHATE 10 MG/ML IJ SOLN
10.0000 mg | Freq: Once | INTRAMUSCULAR | Status: AC
  Administered 2013-09-17: 10 mg via INTRAVENOUS

## 2013-09-17 MED ORDER — FENTANYL CITRATE 0.05 MG/ML IJ SOLN
INTRAMUSCULAR | Status: DC | PRN
  Administered 2013-09-17 (×2): 25 ug via INTRAVENOUS
  Administered 2013-09-17: 50 ug via INTRAVENOUS

## 2013-09-17 MED ORDER — LIDOCAINE-EPINEPHRINE (PF) 2 %-1:200000 IJ SOLN
INTRAMUSCULAR | Status: AC
  Filled 2013-09-17: qty 20

## 2013-09-17 MED ORDER — MIDAZOLAM HCL 2 MG/2ML IJ SOLN
INTRAMUSCULAR | Status: DC | PRN
  Administered 2013-09-17 (×2): 2 mg via INTRAVENOUS

## 2013-09-17 MED ORDER — ACETAMINOPHEN 325 MG PO TABS
650.0000 mg | ORAL_TABLET | Freq: Four times a day (QID) | ORAL | Status: DC | PRN
  Administered 2013-09-21 (×2): 650 mg via ORAL
  Filled 2013-09-17 (×2): qty 2

## 2013-09-17 MED ORDER — BUPIVACAINE HCL (PF) 0.25 % IJ SOLN
INTRAMUSCULAR | Status: DC | PRN
  Administered 2013-09-17 (×2): 5 mL via EPIDURAL

## 2013-09-17 MED ORDER — FENTANYL CITRATE 0.05 MG/ML IJ SOLN
INTRAMUSCULAR | Status: DC | PRN
  Administered 2013-09-17: 100 ug via EPIDURAL

## 2013-09-17 MED ORDER — ROPIVACAINE HCL 2 MG/ML IJ SOLN
10.0000 mL/h | INTRAMUSCULAR | Status: DC
  Administered 2013-09-17: 10 mL/h via EPIDURAL
  Filled 2013-09-17 (×4): qty 200

## 2013-09-17 MED ORDER — PROPOFOL INFUSION 10 MG/ML OPTIME
INTRAVENOUS | Status: DC | PRN
  Administered 2013-09-17: 100 ug/kg/min via INTRAVENOUS

## 2013-09-17 MED ORDER — FLEET ENEMA 7-19 GM/118ML RE ENEM: 1.0000 | ENEMA | Freq: Once | RECTAL | Status: AC | PRN

## 2013-09-17 MED ORDER — ONDANSETRON HCL 4 MG PO TABS: 4.0000 mg | ORAL_TABLET | Freq: Four times a day (QID) | ORAL | Status: DC | PRN

## 2013-09-17 SURGICAL SUPPLY — 57 items
BAG ZIPLOCK 12X15 (MISCELLANEOUS) ×6 IMPLANT
BANDAGE ELASTIC 6 VELCRO ST LF (GAUZE/BANDAGES/DRESSINGS) ×6 IMPLANT
BANDAGE ESMARK 6X9 LF (GAUZE/BANDAGES/DRESSINGS) ×2 IMPLANT
BLADE SAG 18X100X1.27 (BLADE) ×6 IMPLANT
BLADE SAW SGTL 11.0X1.19X90.0M (BLADE) ×6 IMPLANT
BLADE SURG SZ10 CARB STEEL (BLADE) ×6 IMPLANT
BNDG COHESIVE 6X5 TAN STRL LF (GAUZE/BANDAGES/DRESSINGS) ×3 IMPLANT
BNDG ESMARK 6X9 LF (GAUZE/BANDAGES/DRESSINGS) ×6
BOWL SMART MIX CTS (DISPOSABLE) ×6 IMPLANT
CAP KNEE ATTUNE RP ×6 IMPLANT
CEMENT HV SMART SET (Cement) ×12 IMPLANT
CLOSURE WOUND 1/2 X4 (GAUZE/BANDAGES/DRESSINGS) ×2
CUFF TOURN SGL QUICK 34 (TOURNIQUET CUFF) ×4
CUFF TRNQT CYL 34X4X40X1 (TOURNIQUET CUFF) ×2 IMPLANT
DRAPE EXTREMITY BILATERAL (DRAPE) ×3 IMPLANT
DRAPE INCISE IOBAN 66X45 STRL (DRAPES) ×3 IMPLANT
DRAPE LG THREE QUARTER DISP (DRAPES) ×3 IMPLANT
DRAPE POUCH INSTRU U-SHP 10X18 (DRAPES) ×3 IMPLANT
DRAPE U-SHAPE 47X51 STRL (DRAPES) ×9 IMPLANT
DRSG ADAPTIC 3X8 NADH LF (GAUZE/BANDAGES/DRESSINGS) ×6 IMPLANT
DRSG PAD ABDOMINAL 8X10 ST (GAUZE/BANDAGES/DRESSINGS) ×6 IMPLANT
DURAPREP 26ML APPLICATOR (WOUND CARE) ×6 IMPLANT
ELECT REM PT RETURN 9FT ADLT (ELECTROSURGICAL) ×3
ELECTRODE REM PT RTRN 9FT ADLT (ELECTROSURGICAL) ×1 IMPLANT
EVACUATOR 1/8 PVC DRAIN (DRAIN) ×6 IMPLANT
FACESHIELD WRAPAROUND (MASK) ×24 IMPLANT
GAUZE SPONGE 4X4 12PLY STRL (GAUZE/BANDAGES/DRESSINGS) ×3 IMPLANT
GLOVE BIO SURGEON STRL SZ7.5 (GLOVE) ×6 IMPLANT
GLOVE BIO SURGEON STRL SZ8 (GLOVE) ×6 IMPLANT
GLOVE BIOGEL PI IND STRL 8 (GLOVE) ×2 IMPLANT
GLOVE BIOGEL PI INDICATOR 8 (GLOVE) ×4
GOWN STRL REUS W/TWL LRG LVL3 (GOWN DISPOSABLE) ×3 IMPLANT
GOWN STRL REUS W/TWL XL LVL3 (GOWN DISPOSABLE) ×3 IMPLANT
HANDPIECE INTERPULSE COAX TIP (DISPOSABLE) ×2
IMMOBILIZER KNEE 20 (SOFTGOODS) ×12 IMPLANT
IMMOBILIZER KNEE 20 THIGH 36 (SOFTGOODS) ×2 IMPLANT
KIT BASIN OR (CUSTOM PROCEDURE TRAY) ×3 IMPLANT
MANIFOLD NEPTUNE II (INSTRUMENTS) ×3 IMPLANT
NS IRRIG 1000ML POUR BTL (IV SOLUTION) ×3 IMPLANT
PACK TOTAL JOINT (CUSTOM PROCEDURE TRAY) ×3 IMPLANT
PAD ABD 8X10 STRL (GAUZE/BANDAGES/DRESSINGS) ×6 IMPLANT
PADDING CAST COTTON 6X4 STRL (CAST SUPPLIES) ×6 IMPLANT
SET HNDPC FAN SPRY TIP SCT (DISPOSABLE) ×1 IMPLANT
SPONGE LAP 18X18 X RAY DECT (DISPOSABLE) IMPLANT
STOCKINETTE 8 INCH (MISCELLANEOUS) ×3 IMPLANT
STRIP CLOSURE SKIN 1/2X4 (GAUZE/BANDAGES/DRESSINGS) ×4 IMPLANT
SUCTION FRAZIER 12FR DISP (SUCTIONS) ×6 IMPLANT
SUT MNCRL AB 4-0 PS2 18 (SUTURE) ×6 IMPLANT
SUT VIC AB 2-0 CT1 27 (SUTURE) ×12
SUT VIC AB 2-0 CT1 TAPERPNT 27 (SUTURE) ×6 IMPLANT
SUT VLOC 180 0 24IN GS25 (SUTURE) ×6 IMPLANT
TOWEL OR 17X26 10 PK STRL BLUE (TOWEL DISPOSABLE) ×6 IMPLANT
TRAY FOLEY CATH 14FRSI W/METER (CATHETERS) IMPLANT
TRAY FOLEY CATH 16FRSI W/METER (SET/KITS/TRAYS/PACK) ×3 IMPLANT
WATER STERILE IRR 1500ML POUR (IV SOLUTION) ×6 IMPLANT
WRAP KNEE MAXI GEL POST OP (GAUZE/BANDAGES/DRESSINGS) ×6 IMPLANT
YANKAUER SUCT BULB TIP NO VENT (SUCTIONS) ×3 IMPLANT

## 2013-09-17 NOTE — Progress Notes (Signed)
CARE MANAGEMENT NOTE 09/17/2013  Patient:  Mitchell Bell, Mitchell Bell   Account Number:  [id number]  Date Initiated:  09/17/2013  Documentation initiated by:  DAVIS,RHONDA  Subjective/Objective Assessment:   total bilateral knee replacements     Action/Plan:   cir versu snf   Anticipated DC Date:  09/20/2013   Anticipated DC Plan:  IP REHAB FACILITY  In-house referral  NA      DC Planning Services  CM consult      PAC Choice  NA   Choice offered to / List presented to:  C-1 Patient   DME arranged  NA      DME agency  NA     HH arranged  NA      HH agency  NA   Status of service:  In process, will continue to follow Medicare Important Message given?  NA - LOS <3 / Initial given by admissions (If response is "NO", the following Medicare IM given date fields will be blank) Date Medicare IM given:   Medicare IM given by:   Date Additional Medicare IM given:   Additional Medicare IM given by:    Discharge Disposition:    Per UR Regulation:  Reviewed for med. necessity/level of care/duration of stay  If discussed at Long Length of Stay Meetings, dates discussed:    Comments:  Bjorn Loser Davis,RN,BSN,CCM

## 2013-09-17 NOTE — Anesthesia Preprocedure Evaluation (Addendum)
Anesthesia Evaluation  Patient identified by MRN, date of birth, ID band Patient awake    Reviewed: Allergy & Precautions, H&P , NPO status , Patient's Chart, lab work & pertinent test results  Airway Mallampati: I TM Distance: >3 FB Neck ROM: Full    Dental  (+) Teeth Intact, Dental Advisory Given   Pulmonary neg pulmonary ROS,  breath sounds clear to auscultation        Cardiovascular hypertension, Rhythm:Regular Rate:Normal     Neuro/Psych PSYCHIATRIC DISORDERS Anxiety negative neurological ROS     GI/Hepatic Neg liver ROS, GERD-  ,  Endo/Other  negative endocrine ROS  Renal/GU negative Renal ROS  negative genitourinary   Musculoskeletal negative musculoskeletal ROS (+)   Abdominal   Peds  Hematology negative hematology ROS (+)   Anesthesia Other Findings   Reproductive/Obstetrics negative OB ROS                          Anesthesia Physical Anesthesia Plan  ASA: II  Anesthesia Plan: Epidural   Post-op Pain Management:    Induction: Intravenous  Airway Management Planned: Simple Face Mask  Additional Equipment: None  Intra-op Plan:   Post-operative Plan:   Informed Consent: I have reviewed the patients History and Physical, chart, labs and discussed the procedure including the risks, benefits and alternatives for the proposed anesthesia with the patient or authorized representative who has indicated his/her understanding and acceptance.   Dental advisory given  Plan Discussed with: CRNA and Surgeon  Anesthesia Plan Comments:        Anesthesia Quick Evaluation

## 2013-09-17 NOTE — Interval H&P Note (Signed)
History and Physical Interval Note:  09/17/2013 9:07 AM  Mitchell Bell  has presented today for surgery, with the diagnosis of OA BILATERAL KNEE  The various methods of treatment have been discussed with the patient and family. After consideration of risks, benefits and other options for treatment, the patient has consented to  Procedure(s): TOTAL KNEE BILATERAL (Bilateral) as a surgical intervention .  The patient's history has been reviewed, patient examined, no change in status, stable for surgery.  I have reviewed the patient's chart and labs.  Questions were answered to the patient's satisfaction.     Loanne Drilling

## 2013-09-17 NOTE — Anesthesia Procedure Notes (Addendum)
Epidural Patient location during procedure: pre-op Start time: 09/17/2013 9:24 AM End time: 09/17/2013 9:35 AM  Staffing Anesthesiologist: Marcene Duos E Performed by: anesthesiologist   Preanesthetic Checklist Completed: patient identified, site marked, surgical consent, pre-op evaluation, timeout performed, IV checked, risks and benefits discussed and monitors and equipment checked  Epidural Patient position: sitting Prep: Betadine and site prepped and draped Patient monitoring: continuous pulse ox, blood pressure, heart rate and cardiac monitor Approach: midline Location: L4-L5 Injection technique: LOR saline  Needle:  Needle type: Tuohy  Needle gauge: 17 G Needle length: 9 cm and 9 Needle insertion depth: 5.5 cm Catheter type: closed end flexible Catheter size: 19 Gauge Catheter at skin depth: 11 cm Test dose: negative  Assessment Sensory level: T10 Events: blood not aspirated, injection not painful, no injection resistance, negative IV test and no paresthesia  Additional Notes Prepped and draped in sterile fashion. LOR with saline at 5.5cm. Easy thread of catheter to 11cm, 5.5cm in epidural space. Negative aspiration and negative test dose with 1cc 1.5% lido with 1:200k epi. Additional 2cc's 1.5% lido with 1:200k epi given. Pt dosed on way to OR with additional 2% lidocaine with 1:200k epi with T10 sensory level bilaterally.Reason for block:surgical anesthesia

## 2013-09-17 NOTE — Op Note (Signed)
Pre-operative diagnosis- Osteoarthritis  Bilateral knee(s)  Post-operative diagnosis- Osteoarthritis Bilateral knee(s)  Procedure-  Bilateral  Total Knee Arthroplasty  SurgeonGus Rankinank V. Bostyn Kunkler, MD  Assistant- Avel Peace, PA-C   Anesthesia-  Epidural  EBL-* No blood loss amount entered *   Drains Hemovac x 1 each side  Tourniquet time-  Total Tourniquet Time Documented: Thigh (Left) - 48 minutes Thigh (Left) - 36 minutes Total: Thigh (Left) - 84 minutes     Complications- None  Condition-PACU - hemodynamically stable.   Brief Clinical Note  Mitchell Bell is a 53 y.o. year old male with end stage OA of both knees with progressively worsening pain and dysfunction. He has constant pain, with activity and at rest and significant functional deficits with difficulties even with ADLs. He has had extensive non-op management including analgesics, injections of cortisone and viscosupplements, and home exercise program, but remains in significant pain with significant dysfunction. We discussed replacing both knees in the same setting versus one at a time including procedure, risks, potential complications, rehab course, and pros and cons associated with each and the patient elects to do both knees at the same time. He presents now for bilateral Total Knee Arthroplasty.     Procedure in detail---   The patient is brought into the operating room and positioned supine on the operating table. After successful administration of  Epidural,   a tourniquet is placed high on the  Bilateral thigh(s) and the lower extremities are prepped and draped in the usual sterile fashion. Time out is performed by the operating team and then the  Left lower extremity is wrapped in Esmarch, knee flexed and the tourniquet inflated to 300 mmHg.       A midline incision is made with a ten blade through the subcutaneous tissue to the level of the extensor mechanism. A fresh blade is used to make a medial parapatellar  arthrotomy. Soft tissue over the proximal medial tibia is subperiosteally elevated to the joint line with a knife and into the semimembranosus bursa with a Cobb elevator. Soft tissue over the proximal lateral tibia is elevated with attention being paid to avoiding the patellar tendon on the tibial tubercle. The patella is everted, knee flexed 90 degrees and the ACL and PCL are removed. Findings are bone on bone medial and patellofemoral with large osteophytes globally.        The drill is used to create a starting hole in the distal femur and the canal is thoroughly irrigated with sterile saline to remove the fatty contents. The 5 degree Left  valgus alignment guide is placed into the femoral canal and the distal femoral cutting block is pinned to remove 9 mm off the distal femur. Resection is made with an oscillating saw.      The tibia is subluxed forward and the menisci are removed. The extramedullary alignment guide is placed referencing proximally at the medial aspect of the tibial tubercle and distally along the second metatarsal axis and tibial crest. The block is pinned to remove 2mm off the more deficient medial  side. Resection is made with an oscillating saw. Size 7is the most appropriate size for the tibia and the proximal tibia is prepared with the modular drill and keel punch for that size.      The femoral sizing guide is placed and size 7 is most appropriate. Rotation is marked off the epicondylar axis and confirmed by creating a rectangular flexion gap at 90 degrees. The size 7 cutting  block is pinned in this rotation and the anterior, posterior and chamfer cuts are made with the oscillating saw. The intercondylar block is then placed and that cut is made.      Trial size 7 tibial component, trial size 7 posterior stabilized femur and a 8  mm posterior stabilized rotating platform insert trial is placed. Full extension is achieved with excellent varus/valgus and anterior/posterior balance  throughout full range of motion. The patella is everted and thickness measured to be 25  mm. Free hand resection is taken to 14 mm, a 41 template is placed, lug holes are drilled, trial patella is placed, and it tracks normally. Osteophytes are removed off the posterior femur with the trial in place. All trials are removed and the cut bone surfaces prepared with pulsatile lavage. Cement is mixed and once ready for implantation, the size 7 tibial implant, size  7 posterior stabilized femoral component, and the size 41 patella are cemented in place and the patella is held with the clamp. The trial insert is placed and the knee held in full extension.  All extruded cement is removed and once the cement is hard the permanent 8 mm posterior stabilized rotating platform insert is placed into the tibial tray.      The wound is copiously irrigated with saline solution and the extensor mechanism closed over a hemovac drain with #1 V-loc suture. The tourniquet is released for a total tourniquet time of 37  minutes. Flexion against gravity is 140 degrees and the patella tracks normally. Subcutaneous tissue is closed with 2.0 vicryl and subcuticular with running 4.0 Monocryl.       The  Right lower extremity is wrapped in Esmarch, knee flexed and the tourniquet inflated to 300 mmHg.       A midline incision is made with a ten blade through the subcutaneous tissue to the level of the extensor mechanism. A fresh blade is used to make a medial parapatellar arthrotomy. Soft tissue over the proximal medial tibia is subperiosteally elevated to the joint line with a knife and into the semimembranosus bursa with a Cobb elevator. Soft tissue over the proximal lateral tibia is elevated with attention being paid to avoiding the patellar tendon on the tibial tubercle. The patella is everted, knee flexed 90 degrees and the ACL and PCL are removed. Findings are bone on bone medial and patellofemoral with large global osteophytes.         The drill is used to create a starting hole in the distal femur and the canal is thoroughly irrigated with sterile saline to remove the fatty contents. The 5 degree Right  valgus alignment guide is placed into the femoral canal and the distal femoral cutting block is pinned to remove 9 mm off the distal femur. Resection is made with an oscillating saw.      The tibia is subluxed forward and the menisci are removed. The extramedullary alignment guide is placed referencing proximally at the medial aspect of the tibial tubercle and distally along the second metatarsal axis and tibial crest. The block is pinned to remove 2mm off the more deficient medial  side. Resection is made with an oscillating saw. Size 7is the most appropriate size for the tibia and the proximal tibia is prepared with the modular drill and keel punch for that size.      The femoral sizing guide is placed and size 7 is most appropriate. Rotation is marked off the epicondylar axis and confirmed by creating a  rectangular flexion gap at 90 degrees. The size 7 cutting block is pinned in this rotation and the anterior, posterior and chamfer cuts are made with the oscillating saw. The intercondylar block is then placed and that cut is made.      Trial size 7 tibial component, trial size 7 posterior stabilized femur and a 10  mm posterior stabilized rotating platform insert trial is placed. Full extension is achieved with excellent varus/valgus and anterior/posterior balance throughout full range of motion. The patella is everted and thickness measured to be 25  mm. Free hand resection is taken to 14 mm, a 41 template is placed, lug holes are drilled, trial patella is placed, and it tracks normally. Osteophytes are removed off the posterior femur with the trial in place. All trials are removed and the cut bone surfaces prepared with pulsatile lavage. Cement is mixed and once ready for implantation, the size 7 tibial implant, size  7 posterior  stabilized femoral component, and the size 41 patella are cemented in place and the patella is held with the clamp. The trial insert is placed and the knee held in full extension.   All extruded cement is removed and once the cement is hard the permanent 10 mm posterior stabilized rotating platform insert is placed into the tibial tray.      The wound is copiously irrigated with saline solution and the extensor mechanism closed over a hemovac drain with #1 V-loc suture. The tourniquet is released for a total tourniquet time of 37  minutes. Flexion against gravity is 140 degrees and the patella tracks normally. Subcutaneous tissue is closed with 2.0 vicryl and subcuticular with running 4.0 Monocryl. The incisions are cleaned and dried and steri-strips and  bulky sterile dressings are applied. The limbs are placed into knee immobilizers and the patient is awakened and transported to recovery in stable condition.            Please note that a surgical assistant was a medical necessity for this procedure in order to perform it in a safe and expeditious manner. Surgical assistant was necessary to retract the ligaments and vital neurovascular structures to prevent injury to them and also necessary for proper positioning of the limb to allow for anatomic placement of the prosthesis.   Gus Rankin Liesl Simons, MD    09/17/2013, 12:09 PM

## 2013-09-17 NOTE — H&P (View-Only) (Signed)
Mitchell Bell DOB: 11-01-1960 Married / Language: Mitchell Bell / Race: White Male Date of Admission:  09/17/2013 Chief Complaint:  Bilateral Knee Pain History of Present Illness  The patient is a 53 year old male who comes in for a preoperative history and physical. The patient is scheduled for a bilateral total knee arthroplasty to be performed by Dr. Gus Bell. Aluisio, MD at [facility] on 09/17/2013. The patient is a 53 year old male who presents for follow up of their knee. The patient is being followed for their bilateral knee pain and osteoarthritis. They are now several month(s) out from Synvisc injections in the left and cortisone in the right knee. Symptoms reported include: pain, aching and stiffness, while the patient does not report symptoms of: giving way. The patient feels that they are doing poorly and report their pain level to be moderate. The following medication has been used for pain control: none. The patient has reported improvement of their symptoms with: Cortisone injections (but it does not last long) and viscosupplementation (but it did not last). The patient indicates that they have questions or concerns today regarding surgery. Mitchell Bell is at a stage now where the knees are bothering him at all times. It is limiting what he can and cannot do. He still works standing on concrete and despite using good shoes and being on a rubber mat, he does have a lot of pain at work. He has not had benefit from viscosupplementation at all. Cortisone helps, but only lasts for a few weeks. Left knee is somewhat worse than the right, but the right one has become terribly symptomatic also. He is at a stage now where he cannot tolerate the pain and dysfunction anymore, and he is ready to have the knees fixed. They have been treated conservatively in the past for the above stated problem and despite conservative measures, they continue to have progressive pain and severe functional limitations and  dysfunction. They have failed non-operative management including home exercise, medications, and injections. It is felt that they would benefit from undergoing bilateral total joint replacement. Risks and benefits of the procedure have been discussed with the patient and they elect to proceed with surgery. There are no active contraindications to surgery such as ongoing infection or rapidly progressive neurological disease.  Allergies No Known Drug Allergies  Problem List/Past Medical Primary osteoarthritis of both knees (715.16  M17.0) Hypercholesterolemia Kidney Stone Anxiety Disorder Gastroesophageal Reflux Disease High blood pressure  Family History Cancer grandmother fathers side Hypertension father Chronic Obstructive Lung Disease First Degree Relatives. father  Social History Drug/Alcohol Rehab (Currently) no Alcohol use current drinker; drinks beer, wine and hard liquor; less than 5 per week Exercise Exercises weekly; does running / walking and other Drug/Alcohol Rehab (Previously) no Number of flights of stairs before winded 2-3 Marital status married Living situation live with spouse Illicit drug use no Current work status working full time Children 2 Tobacco use Never smoker. never smoker Tobacco / smoke exposure yes Pain Contract no  Medication History Meloxicam (  Tablet, 1 (one) Tablet Oral daily, Taken starting 03/14/2013) Active. Losartan Potassium (  Tablet, Oral) Active. Strattera (  Capsule, Oral) Active. Zoloft (  Tablet, Oral) Active. Simvastatin (  Tablet, Oral) Active. Aspirin EC (  Tablet DR, Oral) Active. Omega-3 Complex (192-251-11MG -MG-UNIT Capsule, Oral) Active. Hydrochlorothiazide (12.5MG  Tablet, Oral) Active.  Past Surgical History Ankle Surgery left Arthroscopy of Knee bilateral Inguinal Hernia Repair open: left  Review of Systems  General Not Present- Chills, Fatigue, Fever, Memory  Loss, Night Sweats, Weight Gain and Weight Loss. Skin Not Present- Eczema, Hives, Itching, Lesions and Rash. HEENT Not Present- Dentures, Double Vision, Headache, Hearing Loss, Tinnitus and Visual Loss. Respiratory Not Present- Allergies, Chronic Cough, Coughing up blood, Shortness of breath at rest and Shortness of breath with exertion. Cardiovascular Not Present- Chest Pain, Difficulty Breathing Lying Down, Murmur, Palpitations, Racing/skipping heartbeats and Swelling. Gastrointestinal Not Present- Abdominal Pain, Bloody Stool, Constipation, Diarrhea, Difficulty Swallowing, Heartburn, Jaundice, Loss of appetitie, Nausea and Vomiting. Male Genitourinary Not Present- Blood in Urine, Discharge, Flank Pain, Incontinence, Painful Urination, Urgency, Urinary frequency, Urinary Retention, Urinating at Night and Weak urinary stream. Musculoskeletal Not Present- Back Pain, Joint Pain, Joint Swelling, Morning Stiffness, Muscle Pain, Muscle Weakness and Spasms. Neurological Not Present- Blackout spells, Difficulty with balance, Dizziness, Paralysis, Tremor and Weakness. Psychiatric Not Present- Insomnia.   Vitals  BP: 118/82 (Sitting, Right Arm, Standard)   Physical Exam ( The physical exam findings are as follows: Note:Patient is a 52 year old male with continued bilateral knee pain. Patient is accompanied today by his wife Mitchell Bell.  General Mental Status -Alert, cooperative and good historian. General Appearance-pleasant, Not in acute distress. Orientation-Oriented X3. Build & Nutrition-Well nourished and Well developed.  Head and Neck Head-normocephalic, atraumatic . Neck Global Assessment - supple, no bruit auscultated on the right, no bruit auscultated on the left.  Eye Vision-Wears corrective lenses. Pupil - Bilateral-Regular and Round. Motion - Bilateral-EOMI.  Chest and Lung Exam Auscultation Breath sounds - clear at anterior chest wall and clear at posterior  chest wall. Adventitious sounds - No Adventitious sounds.  Cardiovascular Auscultation Rhythm - Regular rate and rhythm. Heart Sounds - S1 WNL and S2 WNL. Murmurs & Other Heart Sounds - Auscultation of the heart reveals - No Murmurs.  Abdomen Palpation/Percussion Tenderness - Abdomen is non-tender to palpation. Rigidity (guarding) - Abdomen is soft. Auscultation Auscultation of the abdomen reveals - Bowel sounds normal.  Male Genitourinary Note: Not done, not pertinent to present illness   Musculoskeletal Note: Well developed male alert and oriented in no apparent distress. His hips show normal motion with no discomfort. Both knees show no effusion. He has slight varus deformities of both. Left knee range is about 5-125 degrees. Marked crepitus on range of motion. Tender medial greater than lateral with no instability.  Right knee range about 5-125 degrees with marked crepitus on range of motion. Tender medial greater than lateral with no instability. Pulses, sensation, and motor intact in both lower extremities.  RADIOGRAPHS AP of both knees and lateral taken today show that he has bone on bone arthritis of the medial and patellofemoral compartments of both knees.   Assessment & Plan Primary osteoarthritis of both knees (715.16  M17.0) Note:Plan is for a Bilateral Total Knee Replacements by Dr. Aluisio.  Plan is to go home.  PCP - Dr. Kevin Little  The patient does not have any contraindications and will receive TXA (tranexamic acid) prior to surgery.  Signed electronically by Jaquelyn Sakamoto L Frances Ambrosino, III PA-C 

## 2013-09-17 NOTE — Transfer of Care (Signed)
Immediate Anesthesia Transfer of Care Note  Patient: Mitchell Bell  Procedure(s) Performed: Procedure(s) (LRB): TOTAL KNEE BILATERAL (Bilateral)  Patient Location: PACU  Anesthesia Type: Epidural  Level of Consciousness: sedated, patient cooperative and responds to stimulation  Airway & Oxygen Therapy: Patient Spontanous Breathing and Patient connected to face mask oxgen  Post-op Assessment: Report given to PACU RN and Post -op Vital signs reviewed and stable  Post vital signs: Reviewed and stable  Complications: No apparent anesthesia complications

## 2013-09-17 NOTE — Progress Notes (Signed)
Pt is receiving Naropin 0.2% at 51ml/hr. Pt is getting adaquate relief at this time.

## 2013-09-17 NOTE — Anesthesia Postprocedure Evaluation (Signed)
  Anesthesia Post-op Note  Patient: Mitchell Bell  Procedure(s) Performed: Procedure(s): TOTAL KNEE BILATERAL (Bilateral)  Patient Location: PACU  Anesthesia Type:Epidural  Level of Consciousness: awake, alert  and oriented  Airway and Oxygen Therapy: Patient Spontanous Breathing  Post-op Pain: none  Post-op Assessment: Post-op Vital signs reviewed  Post-op Vital Signs: Reviewed  Last Vitals:  Filed Vitals:   09/17/13 1330  BP: 124/70  Pulse: 65  Temp:   Resp: 13    Complications: No apparent anesthesia complications

## 2013-09-18 ENCOUNTER — Encounter (HOSPITAL_COMMUNITY): Payer: Self-pay | Admitting: Orthopedic Surgery

## 2013-09-18 DIAGNOSIS — M1710 Unilateral primary osteoarthritis, unspecified knee: Secondary | ICD-10-CM

## 2013-09-18 DIAGNOSIS — Z96659 Presence of unspecified artificial knee joint: Secondary | ICD-10-CM

## 2013-09-18 LAB — CBC
HCT: 33.3 % — ABNORMAL LOW (ref 39.0–52.0)
Hemoglobin: 11.3 g/dL — ABNORMAL LOW (ref 13.0–17.0)
MCH: 30.2 pg (ref 26.0–34.0)
MCHC: 33.9 g/dL (ref 30.0–36.0)
MCV: 89 fL (ref 78.0–100.0)
PLATELETS: 169 10*3/uL (ref 150–400)
RBC: 3.74 MIL/uL — AB (ref 4.22–5.81)
RDW: 12.2 % (ref 11.5–15.5)
WBC: 12.8 10*3/uL — ABNORMAL HIGH (ref 4.0–10.5)

## 2013-09-18 LAB — BASIC METABOLIC PANEL
ANION GAP: 11 (ref 5–15)
BUN: 11 mg/dL (ref 6–23)
CHLORIDE: 102 meq/L (ref 96–112)
CO2: 23 mEq/L (ref 19–32)
CREATININE: 0.78 mg/dL (ref 0.50–1.35)
Calcium: 8.6 mg/dL (ref 8.4–10.5)
GFR calc non Af Amer: >90 mL/min (ref >90)
Glucose, Bld: 120 mg/dL — ABNORMAL HIGH (ref 70–99)
POTASSIUM: 4.2 meq/L (ref 3.7–5.3)
SODIUM: 136 meq/L — AB (ref 137–147)

## 2013-09-18 LAB — PROTIME-INR
INR: 1.18 (ref 0.00–1.49)
PROTHROMBIN TIME: 15 s (ref 11.6–15.2)

## 2013-09-18 MED ORDER — OMEPRAZOLE 20 MG PO CPDR
20.0000 mg | DELAYED_RELEASE_CAPSULE | Freq: Every day | ORAL | Status: DC
  Administered 2013-09-18 – 2013-09-22 (×5): 20 mg via ORAL
  Filled 2013-09-18 (×6): qty 1

## 2013-09-18 MED ORDER — WARFARIN - PHARMACIST DOSING INPATIENT: Freq: Every day | Status: DC

## 2013-09-18 MED ORDER — NON FORMULARY: 20.0000 mg | Freq: Every day | Status: DC

## 2013-09-18 MED ORDER — WARFARIN SODIUM 4 MG PO TABS
4.0000 mg | ORAL_TABLET | Freq: Once | ORAL | Status: AC
  Administered 2013-09-18: 4 mg via ORAL
  Filled 2013-09-18: qty 1

## 2013-09-18 NOTE — Addendum Note (Signed)
Addendum created 09/18/13 1241 by Gaetano Hawthorne, MD   Modules edited: Clinical Notes   Clinical Notes:  File: [id number]

## 2013-09-18 NOTE — Progress Notes (Signed)
CSW met with pt / spouse to assist with d/c planning. Pt hopes to return home following hospital d/c but is willing to consider CIR / SNF if recommended. CSW is available to assist with d/c planning, is needed.  Werner Lean LCSW [phone]

## 2013-09-18 NOTE — Progress Notes (Addendum)
ANTICOAGULATION CONSULT NOTE - Initial Consult  Pharmacy Consult for Warfarin Indication: VTE prophylaxis  No Known Allergies  Patient Measurements: Height: 5' 10.5" (179.1 cm) Weight: 213 lb (96.616 kg) IBW/kg (Calculated) : 74.15   Vital Signs: Temp: 99.4 F (37.4 C) (08/26 2201) Temp src: Oral (08/26 2201) BP: 138/65 mmHg (08/26 2201) Pulse Rate: 79 (08/26 2201)  Labs: No results found for this basename: HGB, HCT, PLT, APTT, LABPROT, INR, HEPARINUNFRC, CREATININE, CKTOTAL, CKMB, TROPONINI,  in the last 72 hours  Estimated Creatinine Clearance: 125.4 ml/min (by C-G formula based on Cr of 0.82).   Medical History: Past Medical History  Diagnosis Date  . Arthritis     PAIN AND OA BOTH KNEES  . Hypercholesterolemia   . History of kidney stones   . Anxiety   . GERD (gastroesophageal reflux disease)   . Hypertension   . ADHD (attention deficit hyperactivity disorder)   . Scoliosis     BACK PAIN    Medications:  Scheduled:  . acetaminophen  1,000 mg Oral 4 times per day  . atomoxetine  80 mg Oral q morning - 10a  . dexamethasone  10 mg Oral Daily   Or  . dexamethasone  10 mg Intravenous Daily  . docusate sodium  100 mg Oral BID  . hydrochlorothiazide  12.5 mg Oral q morning - 10a  . HYDROmorphone      . HYDROmorphone      . losartan  50 mg Oral q morning - 10a  . pantoprazole  40 mg Oral Daily  . sertraline  50 mg Oral q morning - 10a  . simvastatin  40 mg Oral QHS   Infusions:  . 0.9 % NaCl with KCl 20 mEq / L 100 mL/hr at 09/17/13 1718  . ropivacaine (PF) 2 mg/ml (0.2%)      Assessment: 52 yoM s/p bilateral TKA.  Pt now with epidural.  Goal of Therapy:  INR 2-3    Plan:   Warfarin  @ 1800 tonight  Daily PT/INR  F/u removal of epidural.  Arley Phenix RPh 09/18/2013, 10:34 AM Pager [phone]

## 2013-09-18 NOTE — Evaluation (Addendum)
Occupational Therapy Evaluation Patient Details Name: Mitchell Bell MRN: [id number] DOB: May 30, 1960 Today's Date: 09/18/2013    History of Present Illness s/p bil TKA   Clinical Impression   This 53 year old man was admitted for the above surgery.  He will benefit from skilled OT to increase safety and independence with adls/bathroom transfers. Goals in acute are for supervision to min guard for grooming, toileting and shower transfers.      Follow Up Recommendations    CIR unless pt progresses to home   Equipment Recommendations  None recommended by OT    Recommendations for Other Services       Precautions / Restrictions Precautions Precautions: Knee Required Braces or Orthoses: Knee Immobilizer - Right;Knee Immobilizer - Left Restrictions Weight Bearing Restrictions: No      Mobility Bed Mobility Overal bed mobility: + 2 for safety/equipment         Sit to supine: +2 for physical assistance;Mod assist   General bed mobility comments: assist for bil LEs--guarded UB  Transfers Overall transfer level: Needs assistance Equipment used: Rolling walker (2 wheeled) Transfers: Sit to/from Stand Sit to Stand: +2 physical assistance;Mod assist         General transfer comment: assist to rise and steady. Cues for walking feet back as well as hand placement    Balance                                            ADL Overall ADL's : Needs assistance/impaired     Grooming: Set up;Sitting   Upper Body Bathing: Set up;Sitting   Lower Body Bathing: +2 for safety/equipment;Moderate assistance;Sit to/from stand   Upper Body Dressing : Set up;Sitting   Lower Body Dressing: +2 for safety/equipment;Moderate assistance;Sit to/from stand   Toilet Transfer: +2 for safety/equipment;Minimal assistance;RW;Ambulation (back to bed)   Toileting- Clothing Manipulation and Hygiene: +2 for safety/equipment;Minimal assistance;Sit to/from stand          General ADL Comments: pt is able to reach to ankles. He is assisting with lifting bil LEs. Wife will assist with ADLs as needed     Vision                     Perception     Praxis      Pertinent Vitals/Pain Pain Assessment: 0-10 Pain Score: 3  Pain Location: bil knees Pain Descriptors / Indicators: Aching Pain Intervention(s): Limited activity within patient's tolerance;Repositioned;Ice applied     Hand Dominance     Extremity/Trunk Assessment Upper Extremity Assessment Upper Extremity Assessment: Overall WFL for tasks assessed           Communication Communication Communication: No difficulties   Cognition Arousal/Alertness: Awake/alert Behavior During Therapy: WFL for tasks assessed/performed Overall Cognitive Status: Within Functional Limits for tasks assessed                     General Comments       Exercises       Shoulder Instructions      Home Living Family/patient expects to be discharged to:: Private residence Living Arrangements: Spouse/significant other                 Bathroom Shower/Tub: Producer, television/film/video: Standard     Home Equipment: Shower seat;Bedside commode  Prior Functioning/Environment Level of Independence: Independent             OT Diagnosis: Generalized weakness   OT Problem List: Decreased strength;Decreased knowledge of use of DME or AE;Pain   OT Treatment/Interventions: Self-care/ADL training;DME and/or AE instruction;Patient/family education    OT Goals(Current goals can be found in the care plan section) Acute Rehab OT Goals Patient Stated Goal: go home OT Goal Formulation: With patient Time For Goal Achievement: 09/25/13 Potential to Achieve Goals: Good ADL Goals Pt Will Perform Grooming: with supervision;standing Pt Will Transfer to Toilet: with min guard assist;bedside commode;ambulating Pt Will Perform Tub/Shower Transfer: with min guard  assist;ambulating;Shower transfer;shower seat  OT Frequency: Min 2X/week   Barriers to D/C:            Co-evaluation    co eval with PT for patient safety:  ADLs addressed during OT          End of Session    Activity Tolerance: Patient tolerated treatment well Patient left: in bed;with call bell/phone within reach;with family/visitor present   Time:1339  - 1402 23 minutes   Charges:  OT General Charges $OT Visit: 1 Procedure OT Evaluation $Initial OT Evaluation Tier I: 1 Procedure G-Codes:    Jachelle Fluty 09/22/13, 3:22 PM  Marica Otter, OTR/L [phone] 09/22/2013

## 2013-09-18 NOTE — Progress Notes (Signed)
Pain control going well with epidural.  Dressing looks intact.  Continue until tomorrow.  Alistar Mcenery MD

## 2013-09-18 NOTE — Evaluation (Signed)
Physical Therapy Evaluation Patient Details Name: Mitchell Bell MRN: [id number] DOB: Dec 30, 1960 Today's Date: 09/18/2013   History of Present Illness  s/p bil TKA  Clinical Impression  Pt s/p Bil TKR presents with decreased BIl LE strength/ROM and post op pain limiting functional mobility.  Pt hopes to progress to d/c home but is considering follow up at CIR level dependent on progress following d/c of epidural    Follow Up Recommendations      Equipment Recommendations       Recommendations for Other Services       Precautions / Restrictions Precautions Precautions: Knee Required Braces or Orthoses: Knee Immobilizer - Right;Knee Immobilizer - Left Restrictions Weight Bearing Restrictions: No      Mobility  Bed Mobility Overal bed mobility: + 2 for safety/equipment         Sit to supine: +2 for physical assistance;Mod assist   General bed mobility comments: assist for bil LEs--guarded UB  Transfers Overall transfer level: Needs assistance Equipment used: Rolling walker (2 wheeled) Transfers: Sit to/from Stand Sit to Stand: +2 physical assistance;Mod assist         General transfer comment: assist to rise and steady. Cues for walking feet back as well as hand placement  Ambulation/Gait                Stairs            Wheelchair Mobility    Modified Rankin (Stroke Patients Only)       Balance                                             Pertinent Vitals/Pain Pain Assessment: 0-10 Pain Score: 3  Pain Location: bil knees Pain Descriptors / Indicators: Aching Pain Intervention(s): Limited activity within patient's tolerance;Repositioned;Ice applied    Home Living Family/patient expects to be discharged to:: Private residence Living Arrangements: Spouse/significant other             Home Equipment: Shower seat;Bedside commode      Prior Function Level of Independence: Independent               Hand  Dominance        Extremity/Trunk Assessment   Upper Extremity Assessment: Overall WFL for tasks assessed                     Communication   Communication: No difficulties  Cognition Arousal/Alertness: Awake/alert Behavior During Therapy: WFL for tasks assessed/performed Overall Cognitive Status: Within Functional Limits for tasks assessed                      General Comments      Exercises        Assessment/Plan    PT Assessment    PT Diagnosis     PT Problem List    PT Treatment Interventions     PT Goals (Current goals can be found in the Care Plan section) Acute Rehab PT Goals Patient Stated Goal: go home    Frequency     Barriers to discharge        Co-evaluation               End of Session                 Time:  -  Charges:         PT G Codes:          Mitchell Bell 09/18/2013, 3:14 PM

## 2013-09-18 NOTE — Progress Notes (Signed)
Physical Therapy Treatment Patient Details Name: Mitchell Bell MRN: [id number] DOB: [birth year] Today's Date: 09/18/2013    History of Present Illness s/p bil TKA    PT Comments    Extremely motivated and progressing well.  Follow Up Recommendations  Home health PT;CIR     Equipment Recommendations  None recommended by PT    Recommendations for Other Services OT consult     Precautions / Restrictions Precautions Precautions: Knee Required Braces or Orthoses: Knee Immobilizer - Right;Knee Immobilizer - Left Knee Immobilizer - Right: Discontinue once straight leg raise with < 10 degree lag Knee Immobilizer - Left: Discontinue once straight leg raise with < 10 degree lag Restrictions Weight Bearing Restrictions: No Other Position/Activity Restrictions: WBAT    Mobility  Bed Mobility Overal bed mobility: + 2 for safety/equipment Bed Mobility: Sit to Supine       Sit to supine: +2 for physical assistance;Mod assist   General bed mobility comments: assist for bil LEs--guarded UB  Transfers Overall transfer level: Needs assistance Equipment used: Rolling walker (2 wheeled) Transfers: Sit to/from Stand Sit to Stand: +2 physical assistance;Mod assist         General transfer comment: assist to rise and steady. Cues for walking feet back as well as hand placement  Ambulation/Gait Ambulation/Gait assistance: Mod assist;+2 safety/equipment Ambulation Distance (Feet): 44 Feet Assistive device: Rolling walker (2 wheeled) Gait Pattern/deviations: Step-to pattern;Decreased step length - right;Decreased step length - left;Shuffle;Trunk flexed Gait velocity: decr   General Gait Details: cues for sequence, posture and position from [employer]            Wheelchair Mobility    Modified Rankin (Stroke Patients Only)       Balance                                    Cognition Arousal/Alertness: Awake/alert Behavior During Therapy: WFL for  tasks assessed/performed Overall Cognitive Status: Within Functional Limits for tasks assessed                      Exercises      General Comments        Pertinent Vitals/Pain Pain Assessment: 0-10 Pain Score: 3  Pain Location: bil knees Pain Descriptors / Indicators: Aching Pain Intervention(s): Limited activity within patient's tolerance;Repositioned;Ice applied    Home Living Family/patient expects to be discharged to:: Private residence Living Arrangements: Spouse/significant other           Home Equipment: Shower seat;Bedside commode      Prior Function Level of Independence: Independent          PT Goals (current goals can now be found in the care plan section) Acute Rehab PT Goals Patient Stated Goal: go home PT Goal Formulation: With patient Time For Goal Achievement: 09/26/13 Potential to Achieve Goals: Good Progress towards PT goals: Progressing toward goals    Frequency  7X/week    PT Plan Current plan remains appropriate    Co-evaluation PT/OT/SLP Co-Evaluation/Treatment: Yes Reason for Co-Treatment: For patient/therapist safety PT goals addressed during session: Mobility/safety with mobility OT goals addressed during session: ADL's and self-care     End of Session Equipment Utilized During Treatment: Gait belt;Right knee immobilizer;Left knee immobilizer Activity Tolerance: Patient tolerated treatment well Patient left: in bed;with call bell/phone within reach;with family/visitor present     Time: 4696-2952 PT Time Calculation (min): 23 min  Charges:  $Gait Training: 8-22 mins                    G Codes:      Buddie Marston September 29, 2013, 3:22 PM

## 2013-09-18 NOTE — Progress Notes (Signed)
I will follow up with pt tomorrow to discus his preference for rehab venue as well as [employer] coverage. [phone]

## 2013-09-18 NOTE — Progress Notes (Signed)
Subjective: 1 Day Post-Op Procedure(s) (LRB): TOTAL KNEE BILATERAL (Bilateral) Patient reports pain as 3 on 0-10 scale, 4 on 0-10 scale and mild.   Patient seen in rounds with Dr. Lequita Halt. Wife in room at bedside. Patient is well, and has had no acute complaints or problems We will start therapy today.  Plan is to go home but also looking into CIR after hospital stay.  Objective: Vital signs in last 24 hours: Temp:  [97.6 F (36.4 C)-99.4 F (37.4 C)] 98.2 F (36.8 C) (08/27 0446) Pulse Rate:  [61-79] 68 (08/27 0446) Resp:  [10-18] 13 (08/27 0557) BP: (117-141)/(64-84) 134/84 mmHg (08/27 0446) SpO2:  [93 %-100 %] 100 % (08/27 0451) Weight:  [96.616 kg (213 lb)] 96.616 kg (213 lb) (08/26 0804)  Intake/Output from previous day:  Intake/Output Summary (Last 24 hours) at 09/18/13 0751 Last data filed at 09/18/13 0740  Gross per 24 hour  Intake 4463.33 ml  Output   3645 ml  Net 818.33 ml    Intake/Output this shift: Total I/O In: 240 [P.O.:240] Out: 850 [Urine:850]  Labs:  Recent Labs  09/18/13 0505  HGB 11.3*    Recent Labs  09/18/13 0505  WBC 12.8*  RBC 3.74*  HCT 33.3*  PLT 169    Recent Labs  09/18/13 0505  NA 136*  K 4.2  CL 102  CO2 23  BUN 11  CREATININE 0.78  GLUCOSE 120*  CALCIUM 8.6    Recent Labs  09/18/13 0505  INR 1.18    EXAM General - Patient is Alert, Appropriate and Oriented Extremity - Neurovascular intact Sensation intact distally Dorsiflexion/Plantar flexion intact Dressing - dressing C/D/I Motor Function - intact, moving feet and toes well on exam.  Both Hemovacs pulled without difficulty.  Past Medical History  Diagnosis Date  . Arthritis     PAIN AND OA BOTH KNEES  . Hypercholesterolemia   . History of kidney stones   . Anxiety   . GERD (gastroesophageal reflux disease)   . Hypertension   . ADHD (attention deficit hyperactivity disorder)   . Scoliosis     BACK PAIN    Assessment/Plan: 1 Day Post-Op  Procedure(s) (LRB): TOTAL KNEE BILATERAL (Bilateral) Principal Problem:   OA (osteoarthritis) of knee  Estimated body mass index is 30.12 kg/(m^2) as calculated from the following:   Height as of this encounter: 5' 10.5" (1.791 m).   Weight as of this encounter: 96.616 kg (213 lb). Advance diet Up with therapy Continue foley due to strict I&O, urinary output monitoring and because of continued epidural management Continue foley for now.  Will keep foley until tomorrow and will not be removed until at least 6-8 hours following the removal of the epidural catheter.  DVT Prophylaxis - Lovenox and Coumadin, Lovenox will not start until tomorrow afternoon following removal of the epidural. First dose of Coumadin this evening. Weight-Bearing as tolerated to both leg  Continue O2 and Pulse OX   Take Coumadin for four weeks and then discontinue.  The dose may need to be adjusted based upon the INR.  Please follow the INR and titrate Coumadin dose for a therapeutic range between 2.0 and 3.0 INR.  After completing the four weeks of Coumadin, the patient may stop the Coumadin and resume their 81 mg Aspirin daily.  Lovenox injections will start tomorrow evening about 12 hours after the epidural has been removed and continue until the INR is therapeutic at or greater than 2.0.  When INR reaches the therapeutic  level of equal to or greater than 2.0, the patient may discontinue the Lovenox injections.  Avel Peace, PA-C Orthopaedic Surgery 09/18/2013, 7:51 AM

## 2013-09-18 NOTE — Consult Note (Signed)
Physical Medicine and Rehabilitation Consult Reason for Consult: Bilateral total knee arthroplasty Referring Physician: Dr.Alusio   HPI: Mitchell Bell is a 53 y.o. right handed male with progressive end-stage osteoarthritis of both knees and no relief with conservative care. Patient independent living with his wife working full-time prior to admission. Admitted 09/17/2013 with constant pain with activities as well as at rest and significant functional deficits.  Underwent bilateral total knee arthroplasty 09/17/2013 per Dr.Alusio. Hospital course pain management. Placed on Coumadin for DVT prophylaxis. Weightbearing as tolerated. Physical and occupational therapy evaluations pending. M.D. has requested physical medicine rehabilitation consult to consider inpatient rehabilitation services.  In recliner  R leg more numb than left Denies any upper ext joint pain No hx of Hip OA Worked FT PTA Review of Systems  Gastrointestinal: Positive for constipation.       GERD  Musculoskeletal: Positive for back pain, joint pain and myalgias.  Psychiatric/Behavioral: Positive for depression.       Anxiety  All other systems reviewed and are negative.  Past Medical History  Diagnosis Date  . Arthritis     PAIN AND OA BOTH KNEES  . Hypercholesterolemia   . History of kidney stones   . Anxiety   . GERD (gastroesophageal reflux disease)   . Hypertension   . ADHD (attention deficit hyperactivity disorder)   . Scoliosis     BACK PAIN   Past Surgical History  Procedure Laterality Date  . Ankle surgery Left     FOR FRACTURE  . Knee arthroscopy Bilateral   . Hernia repair      LEFT INGUINAL HERNIA REPAIR - LEFT AGE 89 OR 7   History reviewed. No pertinent family history. Social History:  reports that he has never smoked. He has never used smokeless tobacco. He reports that he drinks alcohol. He reports that he does not use illicit drugs. Allergies: No Known Allergies Medications Prior  to Admission  Medication Sig Dispense Refill  . aspirin EC 81 MG tablet Take 81 mg by mouth at bedtime.      Marland Kitchen atomoxetine (STRATTERA) 80 MG capsule Take 80 mg by mouth every morning.      . hydrochlorothiazide (MICROZIDE) 12.5 MG capsule Take 12.5 mg by mouth every morning.      Marland Kitchen ibuprofen (ADVIL,MOTRIN) 200 MG tablet Take 400 mg by mouth at bedtime as needed (Pain).      Marland Kitchen losartan (COZAAR) 50 MG tablet Take 50 mg by mouth every morning.      . naproxen sodium (ANAPROX) 220 MG tablet Take 440 mg by mouth daily as needed (Pain).      . OMEGA-3 KRILL OIL PO Take 1 capsule by mouth daily.      Marland Kitchen omeprazole (PRILOSEC) 20 MG capsule Take 20 mg by mouth every morning.      . sertraline (ZOLOFT) 50 MG tablet Take 50 mg by mouth every morning.      . simvastatin (ZOCOR) 40 MG tablet Take 40 mg by mouth at bedtime.        Home: Home Living Family/patient expects to be discharged to:: Private residence Living Arrangements: Spouse/significant other  Functional History:   Functional Status:  Mobility:          ADL:    Cognition: Cognition Orientation Level: Oriented X4    Blood pressure 134/84, pulse 68, temperature 98.2 F (36.8 C), temperature source Oral, resp. rate 15, height 5' 10.5" (1.791 m), weight 96.616 kg (213 lb), SpO2  100.00%. Physical Exam  Vitals reviewed. Constitutional: He is oriented to person, place, and time. He appears well-developed.  HENT:  Head: Normocephalic.  Eyes: EOM are normal.  Neck: Normal range of motion. Neck supple. No thyromegaly present.  Cardiovascular: Normal rate and regular rhythm.   Respiratory: Effort normal and breath sounds normal. No respiratory distress.  GI: Soft. Bowel sounds are normal. He exhibits no distension.  Neurological: He is alert and oriented to person, place, and time.  Skin:  Bilateral knee incisions dressed and appropriately tender  Able to do bilateral straight leg raise Motor strength is 5/5 bilateral deltoid,  bicep, tricep, grip 3/5 bilateral hip flexors knee extensors 4/5 bilateral ankle dorsiflexor Sensation intact to pinprick as well as light touch both lower limb  Results for orders placed during the hospital encounter of 09/17/13 (from the past 24 hour(s))  TYPE AND SCREEN     Status: None   Collection Time    09/17/13  7:55 AM      Result Value Ref Range   ABO/RH(D) O POS     Antibody Screen NEG     Sample Expiration 09/20/2013    ABO/RH     Status: None   Collection Time    09/17/13  7:55 AM      Result Value Ref Range   ABO/RH(D) O POS    CBC     Status: Abnormal   Collection Time    09/18/13  5:05 AM      Result Value Ref Range   WBC 12.8 (*) 4.0 - 10.5 K/uL   RBC 3.74 (*) 4.22 - 5.81 MIL/uL   Hemoglobin 11.3 (*) 13.0 - 17.0 g/dL   HCT 57.8 (*) 46.9 - 62.9 %   MCV 89.0  78.0 - 100.0 fL   MCH 30.2  26.0 - 34.0 pg   MCHC 33.9  30.0 - 36.0 g/dL   RDW 52.8  41.3 - 24.4 %   Platelets 169  150 - 400 K/uL  PROTIME-INR     Status: None   Collection Time    09/18/13  5:05 AM      Result Value Ref Range   Prothrombin Time 15.0  11.6 - 15.2 seconds   INR 1.18  0.00 - 1.49   No results found.  Assessment/Plan: Diagnosis: Bilateral end-stage OA of the knee status post bilateral TK RA 20 07/12/2013 1. Does the need for close, 24 hr/day medical supervision in concert with the patient's rehab needs make it unreasonable for this patient to be served in a less intensive setting? Potentially 2. Co-Morbidities requiring supervision/potential complications: Acute blood loss anemia, mild, pain management 3. Due to bowel management, safety, skin/wound care, disease management, medication administration, pain management and patient education, does the patient require 24 hr/day rehab nursing? Potentially 4. Does the patient require coordinated care of a physician, rehab nurse, PT (1-2 hrs/day, 5 days/week) and OT (1-2 hrs/day, 5 days/week) to address physical and functional deficits in the  context of the above medical diagnosis(es)? Potentially Addressing deficits in the following areas: balance, endurance, locomotion, strength, transferring, bowel/bladder control, bathing, dressing, feeding, grooming and toileting 5. Can the patient actively participate in an intensive therapy program of at least 3 hrs of therapy per day at least 5 days per week? Potentially 6. The potential for patient to make measurable gains while on inpatient rehab is excellent 7. Anticipated functional outcomes upon discharge from inpatient rehab are modified independent  with PT, modified independent with OT, n/a with  SLP. 8. Estimated rehab length of stay to reach the above functional goals is: 5-7d 9. Does the patient have adequate social supports to accommodate these discharge functional goals? Yes 10. Anticipated D/C setting: Home 11. Anticipated post D/C treatments: HH therapy 12. Overall Rehab/Functional Prognosis: excellent  RECOMMENDATIONS: This patient's condition is appropriate for continued rehabilitative care in the following setting: CIR if still requiring physical assistance postop day #3 Patient has agreed to participate in recommended program. Potentially Note that insurance prior authorization may be required for reimbursement for recommended care.  Comment: Expect quick recovery. Excellent LE strength/movement postop day #1    09/18/2013

## 2013-09-19 LAB — CBC
HEMATOCRIT: 29.6 % — AB (ref 39.0–52.0)
HEMOGLOBIN: 10.4 g/dL — AB (ref 13.0–17.0)
MCH: 31.7 pg (ref 26.0–34.0)
MCHC: 35.1 g/dL (ref 30.0–36.0)
MCV: 90.2 fL (ref 78.0–100.0)
Platelets: 142 10*3/uL — ABNORMAL LOW (ref 150–400)
RBC: 3.28 MIL/uL — AB (ref 4.22–5.81)
RDW: 12.3 % (ref 11.5–15.5)
WBC: 11.2 10*3/uL — ABNORMAL HIGH (ref 4.0–10.5)

## 2013-09-19 LAB — URINE CULTURE
Colony Count: NO GROWTH
Culture: NO GROWTH

## 2013-09-19 LAB — PROTIME-INR
INR: 1.13 (ref 0.00–1.49)
PROTHROMBIN TIME: 14.5 s (ref 11.6–15.2)

## 2013-09-19 LAB — BASIC METABOLIC PANEL
Anion gap: 8 (ref 5–15)
BUN: 9 mg/dL (ref 6–23)
CALCIUM: 8.7 mg/dL (ref 8.4–10.5)
CO2: 27 meq/L (ref 19–32)
Chloride: 104 mEq/L (ref 96–112)
Creatinine, Ser: 0.68 mg/dL (ref 0.50–1.35)
GFR calc Af Amer: >90 mL/min (ref >90)
GFR calc non Af Amer: >90 mL/min (ref >90)
GLUCOSE: 106 mg/dL — AB (ref 70–99)
Potassium: 4 mEq/L (ref 3.7–5.3)
SODIUM: 139 meq/L (ref 137–147)

## 2013-09-19 MED ORDER — WARFARIN SODIUM 10 MG PO TABS
10.0000 mg | ORAL_TABLET | Freq: Once | ORAL | Status: AC
  Administered 2013-09-19: 10 mg via ORAL
  Filled 2013-09-19: qty 1

## 2013-09-19 MED ORDER — METHOCARBAMOL 1000 MG/10ML IJ SOLN
500.0000 mg | Freq: Four times a day (QID) | INTRAVENOUS | Status: DC | PRN
  Filled 2013-09-19: qty 5

## 2013-09-19 MED ORDER — METHOCARBAMOL 500 MG PO TABS
750.0000 mg | ORAL_TABLET | Freq: Four times a day (QID) | ORAL | Status: DC | PRN
  Administered 2013-09-20 – 2013-09-22 (×8): 750 mg via ORAL
  Filled 2013-09-19 (×10): qty 2

## 2013-09-19 NOTE — Progress Notes (Signed)
ANTICOAGULATION CONSULT NOTE - Initial Consult  Pharmacy Consult for Warfarin Indication: VTE prophylaxis  No Known Allergies  Patient Measurements: Height: 5' 10.5" (179.1 cm) Weight: 213 lb (96.616 kg) IBW/kg (Calculated) : 74.15   Vital Signs: Temp: 98.8 F (37.1 C) (08/28 1410) Temp src: Oral (08/28 1410) BP: 145/72 mmHg (08/28 1410) Pulse Rate: 79 (08/28 1410)  Labs:  Recent Labs  09/18/13 0505 09/19/13 0515  HGB 11.3* 10.4*  HCT 33.3* 29.6*  PLT 169 142*  LABPROT 15.0 14.5  INR 1.18 1.13  CREATININE 0.78 0.68    Estimated Creatinine Clearance: 128.6 ml/min (by C-G formula based on Cr of 0.68).   Medical History: Past Medical History  Diagnosis Date  . Arthritis     PAIN AND OA BOTH KNEES  . Hypercholesterolemia   . History of kidney stones   . Anxiety   . GERD (gastroesophageal reflux disease)   . Hypertension   . ADHD (attention deficit hyperactivity disorder)   . Scoliosis     BACK PAIN    Medications:  Scheduled:  . atomoxetine  80 mg Oral q morning - 10a  . docusate sodium  100 mg Oral BID  . hydrochlorothiazide  12.5 mg Oral q morning - 10a  . losartan  50 mg Oral q morning - 10a  . omeprazole  20 mg Oral Daily  . sertraline  50 mg Oral q morning - 10a  . simvastatin  40 mg Oral QHS  . Warfarin - Pharmacist Dosing Inpatient   Does not apply q1800   Infusions:  . 0.9 % NaCl with KCl 20 mEq / L 100 mL/hr at 09/19/13 1213  . ropivacaine (PF) 2 mg/ml (0.2%) 10 mL/hr (09/18/13 2220)    Assessment: 52 yoM s/p bilateral TKA.   Epidural pulled 8/28 at 1415 8/28 INR 1.13  Goal of Therapy:  INR 2-3   Plan:   Warfarin  @ 1800 tonight  Daily PT/INR   Arley Phenix RPh 09/19/2013, 2:20 PM Pager [phone]

## 2013-09-19 NOTE — Progress Notes (Signed)
Physical Therapy Treatment Patient Details Name: Mitchell Bell MRN: [id number] DOB: Apr 25, 1960 Today's Date: 09/19/2013    History of Present Illness s/p bil TKA    PT Comments    Pt very motivated with minimal pain - epidural in place but pt demonstrating good awareness/control of LEs  Follow Up Recommendations  Home health PT;CIR     Equipment Recommendations  None recommended by PT    Recommendations for Other Services OT consult     Precautions / Restrictions Precautions Precautions: Knee Required Braces or Orthoses: Knee Immobilizer - Right;Knee Immobilizer - Left Knee Immobilizer - Right: Discontinue once straight leg raise with < 10 degree lag Knee Immobilizer - Left: Discontinue once straight leg raise with < 10 degree lag Restrictions Weight Bearing Restrictions: No Other Position/Activity Restrictions: WBAT    Mobility  Bed Mobility Overal bed mobility: + 2 for safety/equipment Bed Mobility: Supine to Sit     Supine to sit: Min assist;+2 for physical assistance;+2 for safety/equipment     General bed mobility comments: assist for bil LEs--guarded UB  Transfers Overall transfer level: Needs assistance Equipment used: Rolling walker (2 wheeled) Transfers: Sit to/from Stand Sit to Stand: Min assist;Mod assist;+2 physical assistance         General transfer comment: assist to rise and steady. Cues for walking feet back as well as hand placement  Ambulation/Gait Ambulation/Gait assistance: Min assist;+2 physical assistance;+2 safety/equipment Ambulation Distance (Feet): 54 Feet Assistive device: Rolling walker (2 wheeled) Gait Pattern/deviations: Step-to pattern;Decreased step length - right;Decreased step length - left;Shuffle;Trunk flexed Gait velocity: decr   General Gait Details: cues for sequence, posture and position from [employer]            Wheelchair Mobility    Modified Rankin (Stroke Patients Only)       Balance                                    Cognition Arousal/Alertness: Awake/alert Behavior During Therapy: WFL for tasks assessed/performed Overall Cognitive Status: Within Functional Limits for tasks assessed                      Exercises Total Joint Exercises Ankle Circles/Pumps: AROM;Both;15 reps;Supine Quad Sets: AROM;Both;Supine;15 reps Heel Slides: AAROM;Both;Supine;15 reps Straight Leg Raises: AAROM;Both;Supine;15 reps Goniometric ROM: AAROM L knee -8 - 105; R knee -10 - 105    General Comments        Pertinent Vitals/Pain Pain Assessment: 0-10 Pain Score: 3  Pain Location: Bil knees Pain Descriptors / Indicators: Aching;Sore Pain Intervention(s): Limited activity within patient's tolerance;Monitored during session;Premedicated before session;Ice applied    Home Living                      Prior Function            PT Goals (current goals can now be found in the care plan section) Acute Rehab PT Goals Patient Stated Goal: go home PT Goal Formulation: With patient Time For Goal Achievement: 09/26/13 Potential to Achieve Goals: Good Progress towards PT goals: Progressing toward goals    Frequency  7X/week    PT Plan Current plan remains appropriate    Co-evaluation             End of Session Equipment Utilized During Treatment: Gait belt;Right knee immobilizer;Left knee immobilizer Activity Tolerance: Patient tolerated treatment well Patient left:  with call bell/phone within reach;with family/visitor present;in chair     Time: 1610-9604 PT Time Calculation (min): 22 min  Charges:  $Gait Training: 8-22 mins $Therapeutic Exercise: 23-37 mins                    G Codes:      [name] Sep 27, 2013, 11:55 AM

## 2013-09-19 NOTE — Progress Notes (Signed)
OT Cancellation Note  Patient Details Name: Mitchell Bell MRN: [id number] DOB: 1960-05-24   Cancelled Treatment:    Reason Eval/Treat Not Completed: Other (comment)  Epidural pulled this afternoon and just received IV medication.  Will check back this weekend.  [name] 09/19/2013, 3:03 PM Marica Otter, OTR/L [phone] 09/19/2013

## 2013-09-19 NOTE — Plan of Care (Signed)
Problem: Consults Goal: Diagnosis- Total Joint Replacement Outcome: Completed/Met Date Met:  09/19/13 Primary Total Knee BILATERAL

## 2013-09-19 NOTE — Progress Notes (Signed)
Subjective: 2 Days Post-Op Procedure(s) (LRB): TOTAL KNEE BILATERAL (Bilateral) Patient reports pain as mild.   Patient seen in rounds with Dr. Lequita Halt. Patient is well, and has had no acute complaints or problems Epidural to come out today.  Anticipate possible increase in pain temporarily after the epidural has been removed. Plan is to go Home after hospital stay.  Objective: Vital signs in last 24 hours: Temp:  [97.5 F (36.4 C)-98.4 F (36.9 C)] 98.4 F (36.9 C) (08/28 0647) Pulse Rate:  [73-84] 73 (08/28 0647) Resp:  [13-21] 18 (08/28 0800) BP: (122-129)/(60-72) 122/70 mmHg (08/28 0647) SpO2:  [95 %-100 %] 95 % (08/28 0800)  Intake/Output from previous day:  Intake/Output Summary (Last 24 hours) at 09/19/13 0939 Last data filed at 09/19/13 1610  Gross per 24 hour  Intake 3841.67 ml  Output   7600 ml  Net -3758.33 ml    Intake/Output this shift: Total I/O In: 240 [P.O.:240] Out: 125 [Urine:125]  Labs:  Recent Labs  09/18/13 0505 09/19/13 0515  HGB 11.3* 10.4*    Recent Labs  09/18/13 0505 09/19/13 0515  WBC 12.8* 11.2*  RBC 3.74* 3.28*  HCT 33.3* 29.6*  PLT 169 142*    Recent Labs  09/18/13 0505 09/19/13 0515  NA 136* 139  K 4.2 4.0  CL 102 104  BUN 11 9  CREATININE 0.78 0.68  GLUCOSE 120* 106*  CALCIUM 8.6 8.7    Recent Labs  09/18/13 0505 09/19/13 0515  INR 1.18 1.13    EXAM General - Patient is Alert and Appropriate Extremity - Neurovascular intact Sensation intact distally Dorsiflexion/Plantar flexion intact Dressing/Incision - clean, dry, no drainage to both knees Motor Function - intact, moving feet and toes well on exam.    Past Medical History  Diagnosis Date  . Arthritis     PAIN AND OA BOTH KNEES  . Hypercholesterolemia   . History of kidney stones   . Anxiety   . GERD (gastroesophageal reflux disease)   . Hypertension   . ADHD (attention deficit hyperactivity disorder)   . Scoliosis     BACK PAIN     Assessment/Plan: 2 Days Post-Op Procedure(s) (LRB): TOTAL KNEE BILATERAL (Bilateral) Principal Problem:   OA (osteoarthritis) of knee  Estimated body mass index is 30.12 kg/(m^2) as calculated from the following:   Height as of this encounter: 5' 10.5" (1.791 m).   Weight as of this encounter: 96.616 kg (213 lb). Up with therapy Continue foley due to urinary output monitoring and she still has her epidural in place.  Will remove the catheter six hours after the epidural is removed.  Will need to note in chart when the epidural is pulled.  DVT Prophylaxis - Lovenox and Coumadin, Lovenox will not start until later this afternoon though after the epidural has been removed for twelve hours.  Will need to note in chart when the epidural is pulled. Anticipate possible increase in pain temporarily after the epidural has been removed.  Weight-Bearing as tolerated to both legs  Take Coumadin for four weeks and then discontinue.  The dose may need to be adjusted based upon the INR.  Please follow the INR and titrate Coumadin dose for a therapeutic range between 2.0 and 3.0 INR.  After completing the four weeks of Coumadin, the patient may stop the Coumadin and resume their 81 mg Aspirin daily.  Lovenox injections will start later this evening after the epidural has been removed and continue until the INR is therapeutic at  or greater than 2.0.  When INR reaches the therapeutic level of equal to or greater than 2.0, the patient may discontinue the Lovenox injections.  Avel Peace, PA-C Orthopaedic Surgery 09/19/2013, 9:39 AM

## 2013-09-19 NOTE — Progress Notes (Signed)
Physical Therapy Treatment Patient Details Name: Mitchell Bell MRN: [id number] DOB: Jan 14, 1961 Today's Date: 09/19/2013    History of Present Illness s/p bil TKA    PT Comments    Motivated and progressing well.  Epidural out this session.  Follow Up Recommendations  Home health PT;CIR     Equipment Recommendations  None recommended by PT    Recommendations for Other Services OT consult     Precautions / Restrictions Precautions Precautions: Knee Required Braces or Orthoses: Knee Immobilizer - Right;Knee Immobilizer - Left Knee Immobilizer - Right: Discontinue once straight leg raise with < 10 degree lag Knee Immobilizer - Left: Discontinue once straight leg raise with < 10 degree lag Restrictions Weight Bearing Restrictions: No Other Position/Activity Restrictions: WBAT    Mobility  Bed Mobility Overal bed mobility: Needs Assistance;+ 2 for safety/equipment Bed Mobility: Sit to Supine       Sit to supine: Min assist;Mod assist;+2 for safety/equipment   General bed mobility comments: assist for bil LEs--guarded UB  Transfers Overall transfer level: Needs assistance Equipment used: Rolling walker (2 wheeled) Transfers: Sit to/from Stand Sit to Stand: Min assist;Mod assist;+2 safety/equipment         General transfer comment: assist to rise and bring wt over LEs, cues to manage LEs  Ambulation/Gait Ambulation/Gait assistance: Min assist;+2 safety/equipment Ambulation Distance (Feet): 54 Feet Assistive device: Rolling walker (2 wheeled) Gait Pattern/deviations: Step-to pattern;Decreased step length - right;Decreased step length - left;Shuffle;Trunk flexed Gait velocity: decr   General Gait Details: cues for sequence, posture and position from [employer]            Wheelchair Mobility    Modified Rankin (Stroke Patients Only)       Balance                                    Cognition Arousal/Alertness:  Awake/alert Behavior During Therapy: WFL for tasks assessed/performed Overall Cognitive Status: Within Functional Limits for tasks assessed                      Exercises      General Comments        Pertinent Vitals/Pain Pain Assessment: 0-10 Pain Score: 4  Pain Location: Bil knees Pain Descriptors / Indicators: Aching;Sore Pain Intervention(s): Limited activity within patient's tolerance;Monitored during session;Premedicated before session;Ice applied (epidural pulled by aneastheiologist at beginning of session)    Home Living                      Prior Function            PT Goals (current goals can now be found in the care plan section) Acute Rehab PT Goals Patient Stated Goal: go home PT Goal Formulation: With patient Time For Goal Achievement: 09/26/13 Potential to Achieve Goals: Good Progress towards PT goals: Progressing toward goals    Frequency  7X/week    PT Plan Current plan remains appropriate    Co-evaluation             End of Session Equipment Utilized During Treatment: Gait belt;Right knee immobilizer;Left knee immobilizer Activity Tolerance: Patient tolerated treatment well Patient left: in bed;with call bell/phone within reach;with family/visitor present     Time: 1410-1433 PT Time Calculation (min): 23 min  Charges:  $Gait Training: 23-37 mins  G Codes:      Towanda Hornstein 10-08-13, 5:37 PM

## 2013-09-19 NOTE — Progress Notes (Signed)
I met with pt and his wife at bedside. They would like to pursue d/c to home, not inpt rehab. Wife is taking FMLA and pt is progressing well. They will call me if he fails to progress with therapy over the next 24 hrs. [phone]

## 2013-09-19 NOTE — Progress Notes (Signed)
Physical Therapy Treatment Patient Details Name: ARMOND CUTHRELL MRN: [id number] DOB: 14-May-1960 Today's Date: October 15, 2013    History of Present Illness s/p bil TKA    PT Comments    Therex completed.  OOB deferred to later am  Follow Up Recommendations  Home health PT;CIR     Equipment Recommendations  None recommended by PT    Recommendations for Other Services OT consult     Precautions / Restrictions Precautions Precautions: Knee Required Braces or Orthoses: Knee Immobilizer - Right;Knee Immobilizer - Left Knee Immobilizer - Right: Discontinue once straight leg raise with < 10 degree lag Knee Immobilizer - Left: Discontinue once straight leg raise with < 10 degree lag Restrictions Weight Bearing Restrictions: No Other Position/Activity Restrictions: WBAT    Mobility  Bed Mobility                  Transfers                    Ambulation/Gait                 Stairs            Wheelchair Mobility    Modified Rankin (Stroke Patients Only)       Balance                                    Cognition Arousal/Alertness: Awake/alert Behavior During Therapy: WFL for tasks assessed/performed Overall Cognitive Status: Within Functional Limits for tasks assessed                      Exercises Total Joint Exercises Ankle Circles/Pumps: AROM;Both;15 reps;Supine Quad Sets: AROM;Both;Supine;15 reps Heel Slides: AAROM;Both;Supine;15 reps Straight Leg Raises: AAROM;Both;Supine;15 reps Goniometric ROM: AAROM L knee -8 - 105; R knee -10 - 105    General Comments        Pertinent Vitals/Pain Pain Assessment: 0-10 Pain Score: 3  Pain Location: Bil knees Pain Descriptors / Indicators: Aching;Sore Pain Intervention(s): Limited activity within patient's tolerance;Monitored during session;Premedicated before session;Ice applied (epidural in place)    Home Living                      Prior Function             PT Goals (current goals can now be found in the care plan section) Acute Rehab PT Goals Patient Stated Goal: go home PT Goal Formulation: With patient Time For Goal Achievement: 09/26/13 Potential to Achieve Goals: Good Progress towards PT goals: Progressing toward goals    Frequency  7X/week    PT Plan Current plan remains appropriate    Co-evaluation             End of Session           Time: 4098-1191 PT Time Calculation (min): 23 min  Charges:  $Therapeutic Exercise: 23-37 mins                    G Codes:      Chariti Havel Oct 15, 2013, 11:52 AM

## 2013-09-19 NOTE — Addendum Note (Signed)
Addendum created 09/19/13 1417 by Gaylan Gerold, MD   Modules edited: Clinical Notes   Clinical Notes:  File: [id number]; File: [id number]

## 2013-09-19 NOTE — Progress Notes (Addendum)
Epidural note Pain well controlled with good motor control. Epidural site clean and dry. Dressing dislodged slightly. Epidural catheter pulled without issue. Tip intact. Wait 2 hours minimum prior to giving anticoagulants. For questions, feel free to contact me. Braison Snoke [phone]

## 2013-09-20 LAB — CBC
HEMATOCRIT: 30.8 % — AB (ref 39.0–52.0)
Hemoglobin: 10.7 g/dL — ABNORMAL LOW (ref 13.0–17.0)
MCH: 31.5 pg (ref 26.0–34.0)
MCHC: 34.7 g/dL (ref 30.0–36.0)
MCV: 90.6 fL (ref 78.0–100.0)
PLATELETS: 156 10*3/uL (ref 150–400)
RBC: 3.4 MIL/uL — AB (ref 4.22–5.81)
RDW: 12.6 % (ref 11.5–15.5)
WBC: 10.6 10*3/uL — AB (ref 4.0–10.5)

## 2013-09-20 LAB — PROTIME-INR
INR: 1.22 (ref 0.00–1.49)
Prothrombin Time: 15.4 seconds — ABNORMAL HIGH (ref 11.6–15.2)

## 2013-09-20 LAB — BASIC METABOLIC PANEL
ANION GAP: 9 (ref 5–15)
BUN: 8 mg/dL (ref 6–23)
CALCIUM: 8.9 mg/dL (ref 8.4–10.5)
CHLORIDE: 96 meq/L (ref 96–112)
CO2: 30 meq/L (ref 19–32)
CREATININE: 0.88 mg/dL (ref 0.50–1.35)
GFR calc Af Amer: >90 mL/min (ref >90)
GFR calc non Af Amer: >90 mL/min (ref >90)
GLUCOSE: 99 mg/dL (ref 70–99)
Potassium: 3.8 mEq/L (ref 3.7–5.3)
Sodium: 135 mEq/L — ABNORMAL LOW (ref 137–147)

## 2013-09-20 MED ORDER — COUMADIN BOOK
Freq: Once | Status: AC
  Administered 2013-09-20: 18:00:00
  Filled 2013-09-20: qty 1

## 2013-09-20 MED ORDER — WARFARIN SODIUM 10 MG PO TABS
10.0000 mg | ORAL_TABLET | Freq: Once | ORAL | Status: AC
  Administered 2013-09-20: 10 mg via ORAL
  Filled 2013-09-20 (×2): qty 1

## 2013-09-20 MED ORDER — ENOXAPARIN SODIUM 30 MG/0.3ML ~~LOC~~ SOLN
30.0000 mg | Freq: Two times a day (BID) | SUBCUTANEOUS | Status: DC
  Administered 2013-09-20 – 2013-09-21 (×4): 30 mg via SUBCUTANEOUS
  Filled 2013-09-20 (×7): qty 0.3

## 2013-09-20 NOTE — Progress Notes (Addendum)
ANTICOAGULATION CONSULT NOTE   Pharmacy Consult for Warfarin Indication: VTE prophylaxis  No Known Allergies  Patient Measurements: Height: 5' 10.5" (179.1 cm) Weight: 213 lb (96.616 kg) IBW/kg (Calculated) : 74.15   Vital Signs: Temp: 99.9 F (37.7 C) (08/29 0521) Temp src: Axillary (08/29 0521) BP: 140/76 mmHg (08/29 0521) Pulse Rate: 81 (08/29 0521)  Labs:  Recent Labs  09/18/13 0505 09/19/13 0515 09/20/13 0500  HGB 11.3* 10.4* 10.7*  HCT 33.3* 29.6* 30.8*  PLT 169 142* 156  LABPROT 15.0 14.5 15.4*  INR 1.18 1.13 1.22  CREATININE 0.78 0.68 0.88    Estimated Creatinine Clearance: 116.9 ml/min (by C-G formula based on Cr of 0.88).   Medications:  Scheduled:  . atomoxetine  80 mg Oral q morning - 10a  . docusate sodium  100 mg Oral BID  . hydrochlorothiazide  12.5 mg Oral q morning - 10a  . losartan  50 mg Oral q morning - 10a  . omeprazole  20 mg Oral Daily  . sertraline  50 mg Oral q morning - 10a  . simvastatin  40 mg Oral QHS  . Warfarin - Pharmacist Dosing Inpatient   Does not apply q1800   Infusions:  . 0.9 % NaCl with KCl 20 mEq / L Stopped (09/19/13 1400)    Assessment: 52 yoM s/p bilateral TKA on 8/26.  Pharmacy was consulted to dose warfarin for VTE prophylaxis post op.    Significant Events: 8/27: Low dose warfarin with epidural still in place 8/28: Epidural pulled @ 1415, full dose warfarin started 8/29: Start Lovenox prophylaxis bridge  8/29:  INR 1.22, remains subtherapeutic as expected after only 2 warfarin doses.  CBC:  Hgb 10.7, stable.  Plt remain WNL.  Tolerating regular diet, no bleeding or complications noted  No Lovenox bridge started after epidural pulled; begin Lovenox today per MD.   Goal of Therapy:  INR 2-3   Plan:   Warfarin  @ 1800 tonight  Daily PT/INR  Per ortho, continue Lovenox until INR > 2, then d/c.  Warfarin education prior to discharge.   Lynann Beaver PharmD, BCPS Pager  [phone] 09/20/2013 10:52 AM     Addendum: Warfarin education completed 8/29

## 2013-09-20 NOTE — Progress Notes (Signed)
Subjective: 3 Days Post-Op Procedure(s) (LRB): TOTAL KNEE BILATERAL (Bilateral) Patient reports pain as 3 on 0-10 scale.    Objective: Vital signs in last 24 hours: Temp:  [98.8 F (37.1 C)-99.9 F (37.7 C)] 99.9 F (37.7 C) (08/29 0521) Pulse Rate:  [79-82] 81 (08/29 0521) Resp:  [13-18] 16 (08/29 0521) BP: (135-145)/(72-79) 140/76 mmHg (08/29 0521) SpO2:  [98 %-100 %] 100 % (08/29 0521)  Intake/Output from previous day: 08/28 0701 - 08/29 0700 In: 1302.7 [P.O.:1020; I.V.:282.7] Out: 2975 [Urine:2975] Intake/Output this shift: Total I/O In: -  Out: 600 [Urine:600]   Recent Labs  09/18/13 0505 09/19/13 0515 09/20/13 0500  HGB 11.3* 10.4* 10.7*    Recent Labs  09/19/13 0515 09/20/13 0500  WBC 11.2* 10.6*  RBC 3.28* 3.40*  HCT 29.6* 30.8*  PLT 142* 156    Recent Labs  09/19/13 0515 09/20/13 0500  NA 139 135*  K 4.0 3.8  CL 104 96  CO2 27 30  BUN 9 8  CREATININE 0.68 0.88  GLUCOSE 106* 99  CALCIUM 8.7 8.9    Recent Labs  09/19/13 0515 09/20/13 0500  INR 1.13 1.22    Neurologically intact Neurovascular intact Sensation intact distally Dorsiflexion/Plantar flexion intact Incision: dressing C/D/I  Assessment/Plan: 3 Days Post-Op Procedure(s) (LRB): TOTAL KNEE BILATERAL (Bilateral) Advance diet Up with therapy  Mckenzee Beem C 09/20/2013, 8:56 AM

## 2013-09-20 NOTE — Progress Notes (Signed)
Physical Therapy Treatment Patient Details Name: Mitchell Bell MRN: [id number] DOB: Jan 09, 1961 Today's Date: 10-02-2013    History of Present Illness s/p bil TKA    PT Comments    Progressing well.  Follow Up Recommendations  Home health PT;CIR     Equipment Recommendations  None recommended by PT    Recommendations for Other Services OT consult     Precautions / Restrictions Precautions Precautions: Knee Required Braces or Orthoses: Knee Immobilizer - Right;Knee Immobilizer - Left Knee Immobilizer - Right: Discontinue once straight leg raise with < 10 degree lag Knee Immobilizer - Left: Discontinue once straight leg raise with < 10 degree lag Restrictions Weight Bearing Restrictions: No Other Position/Activity Restrictions: WBAT    Mobility  Bed Mobility Overal bed mobility: Needs Assistance Bed Mobility: Sit to Supine       Sit to supine: Min assist   General bed mobility comments: assist for Bil LEs  Transfers Overall transfer level: Needs assistance Equipment used: Rolling walker (2 wheeled) Transfers: Sit to/from Stand Sit to Stand: Min assist;Mod assist;+2 safety/equipment         General transfer comment: assist to rise and bring wt over LEs, cues to manage LEs  Ambulation/Gait Ambulation/Gait assistance: Min assist Ambulation Distance (Feet): 100 Feet Assistive device: Rolling walker (2 wheeled) Gait Pattern/deviations: Step-to pattern;Decreased step length - right;Decreased step length - left;Shuffle;Trunk flexed Gait velocity: decr   General Gait Details: cues for sequence, posture and position from [employer]            Wheelchair Mobility    Modified Rankin (Stroke Patients Only)       Balance                                    Cognition Arousal/Alertness: Awake/alert Behavior During Therapy: WFL for tasks assessed/performed Overall Cognitive Status: Within Functional Limits for tasks assessed                      Exercises      General Comments        Pertinent Vitals/Pain Pain Assessment: 0-10 Pain Score: 4  Pain Location: Bil knees Pain Descriptors / Indicators: Sore Pain Intervention(s): Limited activity within patient's tolerance;Monitored during session;Premedicated before session;Ice applied    Home Living                      Prior Function            PT Goals (current goals can now be found in the care plan section) Acute Rehab PT Goals Patient Stated Goal: go home PT Goal Formulation: With patient Time For Goal Achievement: 09/26/13 Potential to Achieve Goals: Good Progress towards PT goals: Progressing toward goals    Frequency  7X/week    PT Plan Current plan remains appropriate    Co-evaluation             End of Session Equipment Utilized During Treatment: Gait belt;Right knee immobilizer Activity Tolerance: Patient tolerated treatment well Patient left: in bed;with call bell/phone within reach;with family/visitor present     Time: 1347-1410 PT Time Calculation (min): 23 min  Charges:  $Gait Training: 23-37 mins                    G Codes:      Keneshia Tena 2013/10/02, 4:53 PM

## 2013-09-20 NOTE — Progress Notes (Signed)
Physical Therapy Treatment Patient Details Name: Mitchell Bell MRN: [id number] DOB: Nov 18, 1960 Today's Date: 09/20/2013    History of Present Illness s/p bil TKA    PT Comments    Pt motivated but with increased pain since epidural removed.  Increased time all activities  Follow Up Recommendations  Home health PT;CIR     Equipment Recommendations  None recommended by PT    Recommendations for Other Services OT consult     Precautions / Restrictions Precautions Precautions: Knee Required Braces or Orthoses: Knee Immobilizer - Right;Knee Immobilizer - Left Knee Immobilizer - Right: Discontinue once straight leg raise with < 10 degree lag Knee Immobilizer - Left: Discontinue once straight leg raise with < 10 degree lag (Pt performed IND SLR this am) Restrictions Weight Bearing Restrictions: No Other Position/Activity Restrictions: WBAT    Mobility  Bed Mobility Overal bed mobility: Needs Assistance;+ 2 for safety/equipment Bed Mobility: Supine to Sit     Supine to sit: Min assist;Mod assist;+2 for physical assistance     General bed mobility comments: assist for bil LEs--guarded UB  Transfers Overall transfer level: Needs assistance Equipment used: Rolling walker (2 wheeled) Transfers: Sit to/from Stand Sit to Stand: Min assist;Mod assist;+2 safety/equipment         General transfer comment: assist to rise and bring wt over LEs, cues to manage LEs  Ambulation/Gait Ambulation/Gait assistance: Min assist;Mod assist;+2 physical assistance;+2 safety/equipment Ambulation Distance (Feet): 54 Feet Assistive device: Rolling walker (2 wheeled) Gait Pattern/deviations: Step-to pattern;Decreased step length - right;Decreased step length - left;Shuffle;Trunk flexed Gait velocity: decr   General Gait Details: cues for sequence, posture and position from [employer]            Wheelchair Mobility    Modified Rankin (Stroke Patients Only)       Balance                                     Cognition Arousal/Alertness: Awake/alert Behavior During Therapy: WFL for tasks assessed/performed Overall Cognitive Status: Within Functional Limits for tasks assessed                      Exercises Total Joint Exercises Ankle Circles/Pumps: AROM;Both;15 reps;Supine Quad Sets: AROM;Both;Supine;15 reps Heel Slides: AAROM;Both;Supine;10 reps Straight Leg Raises: AAROM;Both;Supine;15 reps Goniometric ROM: AAROM L knee 45, r knee 40    General Comments        Pertinent Vitals/Pain Pain Assessment: 0-10 Pain Score: 5  Pain Location: Bil knees Pain Descriptors / Indicators: Aching;Other (Comment) (stingy under the knee caps) Pain Intervention(s): Limited activity within patient's tolerance;Monitored during session;Premedicated before session;Ice applied    Home Living                      Prior Function            PT Goals (current goals can now be found in the care plan section) Acute Rehab PT Goals Patient Stated Goal: go home PT Goal Formulation: With patient Time For Goal Achievement: 09/26/13 Potential to Achieve Goals: Good Progress towards PT goals: Progressing toward goals    Frequency  7X/week    PT Plan Current plan remains appropriate    Co-evaluation             End of Session Equipment Utilized During Treatment: Gait belt;Right knee immobilizer Activity Tolerance: Patient tolerated treatment well Patient  left: in chair;with call bell/phone within reach;with family/visitor present     Time: 1610-9604 PT Time Calculation (min): 49 min  Charges:  $Gait Training: 8-22 mins $Therapeutic Exercise: 8-22 mins $Therapeutic Activity: 8-22 mins                    G Codes:      Mitchell Bell Oct 16, 2013, 9:31 AM

## 2013-09-21 LAB — CBC
HEMATOCRIT: 30.5 % — AB (ref 39.0–52.0)
HEMOGLOBIN: 10.7 g/dL — AB (ref 13.0–17.0)
MCH: 30.9 pg (ref 26.0–34.0)
MCHC: 35.1 g/dL (ref 30.0–36.0)
MCV: 88.2 fL (ref 78.0–100.0)
Platelets: 202 10*3/uL (ref 150–400)
RBC: 3.46 MIL/uL — ABNORMAL LOW (ref 4.22–5.81)
RDW: 12.1 % (ref 11.5–15.5)
WBC: 8.6 10*3/uL (ref 4.0–10.5)

## 2013-09-21 LAB — PROTIME-INR
INR: 1.53 — ABNORMAL HIGH (ref 0.00–1.49)
Prothrombin Time: 18.4 seconds — ABNORMAL HIGH (ref 11.6–15.2)

## 2013-09-21 MED ORDER — WARFARIN SODIUM 4 MG PO TABS
8.0000 mg | ORAL_TABLET | Freq: Once | ORAL | Status: AC
  Administered 2013-09-21: 8 mg via ORAL
  Filled 2013-09-21: qty 2

## 2013-09-21 NOTE — Progress Notes (Signed)
Occupational Therapy Treatment Patient Details Name: Mitchell Bell MRN: [id number] DOB: March 08, 1960 Today's Date: 09/21/2013    History of present illness s/p bil TKA   OT comments  Pt went to bathroom and sat on toilet for first time  Follow Up Recommendations  No OT follow up          Precautions / Restrictions Precautions Precautions: Knee Restrictions Weight Bearing Restrictions: No Other Position/Activity Restrictions: WBAT       Mobility Bed Mobility Overal bed mobility: Needs Assistance         Sit to supine: Min assist   General bed mobility comments: assist for Bil LEs  Transfers Overall transfer level: Needs assistance Equipment used: Rolling walker (2 wheeled) Transfers: Sit to/from Stand Sit to Stand: Mod assist;+2 safety/equipment         General transfer comment: assist to rise and bring wt over LEs, cues to manage LEs        ADL                           Toilet Transfer: Moderate assistance;+2 for safety/equipment;RW;Ambulation   Toileting- Clothing Manipulation and Hygiene: +2 for safety/equipment;Sit to/from stand;Moderate assistance         General ADL Comments: Pt has a small space in his bathroom in which son will measure today. will decide on toilet DME next day prior to DC                Cognition   Behavior During Therapy: [facility] for tasks assessed/performed Overall Cognitive Status: Within Functional Limits for tasks assessed                                    Pertinent Vitals/ Pain       Pain Score: 4  Pain Location: bil knees Pain Descriptors / Indicators: Sore Pain Intervention(s): Monitored during session;Ice applied            Progress Toward Goals  OT Goals(current goals can now be found in the care plan section)  Progress towards OT goals: Progressing toward goals     Plan Discharge plan remains appropriate       End of Session Equipment Utilized During Treatment: Rolling  walker CPM Left Knee CPM Left Knee: Off CPM Right Knee CPM Right Knee: Off   Activity Tolerance Patient tolerated treatment well   Patient Left in bed;with call bell/phone within reach;with family/visitor present   Nurse Communication Mobility status        Time: 4098-1191 OT Time Calculation (min): 25 min  Charges: OT General Charges $OT Visit: 1 Procedure OT Treatments $Self Care/Home Management : 23-37 mins  Mitchell Bell D 09/21/2013, 10:08 AM

## 2013-09-21 NOTE — Progress Notes (Signed)
Physical Therapy Treatment Patient Details Name: Mitchell Bell MRN: [id number] DOB: 1960-09-02 Today's Date: 09/21/2013    History of Present Illness s/p bil TKA    PT Comments    Progressing this date to ambulation without KIs.  Follow Up Recommendations  Home health PT;CIR     Equipment Recommendations  None recommended by PT    Recommendations for Other Services OT consult     Precautions / Restrictions Precautions Precautions: Knee Required Braces or Orthoses: Knee Immobilizer - Right;Knee Immobilizer - Left Knee Immobilizer - Right: Discontinue once straight leg raise with < 10 degree lag Knee Immobilizer - Left: Discontinue once straight leg raise with < 10 degree lag Restrictions Weight Bearing Restrictions: No Other Position/Activity Restrictions: WBAT    Mobility  Bed Mobility Overal bed mobility: Needs Assistance Bed Mobility: Supine to Sit     Supine to sit: Min guard Sit to supine: Min assist   General bed mobility comments: assist for Bil LEs  Transfers Overall transfer level: Needs assistance Equipment used: Rolling walker (2 wheeled) Transfers: Sit to/from Stand Sit to Stand: Mod assist;+2 safety/equipment         General transfer comment: assist to rise and bring wt over LEs, cues to manage LEs  Ambulation/Gait Ambulation/Gait assistance: Min assist Ambulation Distance (Feet): 111 Feet Assistive device: Rolling walker (2 wheeled) Gait Pattern/deviations: Step-to pattern;Decreased step length - right;Decreased step length - left;Shuffle Gait velocity: decr   General Gait Details: cues for sequence, posture and position from [employer]            Wheelchair Mobility    Modified Rankin (Stroke Patients Only)       Balance                                    Cognition Arousal/Alertness: Awake/alert Behavior During Therapy: WFL for tasks assessed/performed Overall Cognitive Status: Within Functional Limits  for tasks assessed                      Exercises Total Joint Exercises Ankle Circles/Pumps: AROM;Both;15 reps;Supine Quad Sets: AROM;Both;Supine;20 reps Heel Slides: AAROM;Both;Supine;20 reps Straight Leg Raises: Both;Supine;20 reps;AAROM;AROM Goniometric ROM: AAROM at R knee - 8 - 80, L -8 - 90    General Comments        Pertinent Vitals/Pain Pain Assessment: 0-10 Pain Score: 4  Pain Location: bil knees Pain Descriptors / Indicators: Sore Pain Intervention(s): Monitored during session;Ice applied    Home Living                      Prior Function            PT Goals (current goals can now be found in the care plan section) Acute Rehab PT Goals Patient Stated Goal: go home PT Goal Formulation: With patient Time For Goal Achievement: 09/26/13 Potential to Achieve Goals: Good Progress towards PT goals: Progressing toward goals    Frequency  7X/week    PT Plan Current plan remains appropriate    Co-evaluation             End of Session Equipment Utilized During Treatment: Gait belt Activity Tolerance: Patient tolerated treatment well Patient left: in chair;with call bell/phone within reach;with family/visitor present     Time: 2595-6387 PT Time Calculation (min): 58 min  Charges:  $Gait Training: 23-37 mins $Therapeutic Exercise: 23-37 mins  G Codes:      Mitchell Bell 25-Sep-2013, 1:33 PM

## 2013-09-21 NOTE — Progress Notes (Signed)
Physical Therapy Treatment Patient Details Name: Mitchell Bell MRN: [id number] DOB: [birth year] Today's Date: 09/21/2013    History of Present Illness s/p bil TKA    PT Comments    Pt motivated and progressing to stairs this pm.  Follow Up Recommendations  Home health PT;CIR     Equipment Recommendations  None recommended by PT    Recommendations for Other Services OT consult     Precautions / Restrictions Precautions Precautions: Knee Required Braces or Orthoses: Knee Immobilizer - Right;Knee Immobilizer - Left Knee Immobilizer - Right: Discontinue once straight leg raise with < 10 degree lag Knee Immobilizer - Left: Discontinue once straight leg raise with < 10 degree lag Restrictions Weight Bearing Restrictions: No Other Position/Activity Restrictions: WBAT    Mobility  Bed Mobility Overal bed mobility: Needs Assistance Bed Mobility: Supine to Sit;Sit to Supine     Supine to sit: Supervision Sit to supine: Min assist   General bed mobility comments: assist for Bil LEs onto bed - pt very fatigued  Transfers Overall transfer level: Needs assistance Equipment used: Rolling walker (2 wheeled) Transfers: Sit to/from Stand Sit to Stand: Min assist;From elevated surface         General transfer comment: cues for transition position and use of UEs (one on bed, one on RW) to self assist from EOB  Ambulation/Gait Ambulation/Gait assistance: Min assist Ambulation Distance (Feet): 60 Feet Assistive device: Rolling walker (2 wheeled) Gait Pattern/deviations: Step-to pattern;Decreased step length - right;Decreased step length - left;Shuffle;Trunk flexed Gait velocity: decr   General Gait Details: min cues for posture and position from RW   Stairs Stairs: Yes Stairs assistance: +2 physical assistance Stair Management: Two rails;Step to pattern;Forwards Number of Stairs: 2 General stair comments: cues for sequencing, foot placement and hand  placement  Wheelchair Mobility    Modified Rankin (Stroke Patients Only)       Balance                                    Cognition Arousal/Alertness: Awake/alert Behavior During Therapy: WFL for tasks assessed/performed Overall Cognitive Status: Within Functional Limits for tasks assessed                      Exercises Total Joint Exercises Knee Flexion: AROM;AAROM;5 reps;Seated (to bring legs under to assist with sit/stand)    General Comments        Pertinent Vitals/Pain Pain Assessment: 0-10 Pain Score: 4  Pain Location: Bil knees Pain Descriptors / Indicators: Aching;Sore Pain Intervention(s): Limited activity within patient's tolerance;Monitored during session;Premedicated before session;Ice applied    Home Living                      Prior Function            PT Goals (current goals can now be found in the care plan section) Acute Rehab PT Goals Patient Stated Goal: go home PT Goal Formulation: With patient Time For Goal Achievement: 09/26/13 Potential to Achieve Goals: Good Progress towards PT goals: Progressing toward goals    Frequency  7X/week    PT Plan Current plan remains appropriate    Co-evaluation             End of Session Equipment Utilized During Treatment: Gait belt Activity Tolerance: Patient tolerated treatment well Patient left: in bed;with call bell/phone within reach;with family/visitor present  Time: 4098-1191 PT Time Calculation (min): 35 min  Charges:  $Gait Training: 8-22 mins $Therapeutic Activity: 8-22 mins                    G Codes:      Cressie Betzler 10/03/13, 4:40 PM

## 2013-09-21 NOTE — Progress Notes (Signed)
CARE MANAGEMENT NOTE 09/21/2013  Patient:  Mitchell Bell, Mitchell Bell   Account Number:  [id number]  Date Initiated:  09/17/2013  Documentation initiated by:  DAVIS,RHONDA  Subjective/Objective Assessment:   total bilateral knee replacements     Action/Plan:   cir versu snf   Anticipated DC Date:  09/20/2013   Anticipated DC Plan:  HOME W HOME HEALTH SERVICES  In-house referral  NA      DC Planning Services  CM consult      [facility] Choice  HOME HEALTH   Choice offered to / List presented to:  C-1 Patient   DME arranged  NA      DME agency  NA     HH arranged  HH-2 PT      [facility] agency  [facility]   Status of service:  Completed, signed off Medicare Important Message given?  NO (If response is "NO", the following Medicare IM given date fields will be blank) Date Medicare IM given:   Medicare IM given by:   Date Additional Medicare IM given:   Additional Medicare IM given by:    Discharge Disposition:  HOME W HOME HEALTH SERVICES  Per UR Regulation:  Reviewed for med. necessity/level of care/duration of stay  If discussed at Long Length of Stay Meetings, dates discussed:    Comments:  09/21/2013 1200 NCM spoke to pt and plan is going home with [facility]. HH arranged with [place]. Pt states he has RW, 3n1 and cane at home. Wife at home to assist with his care. Isidoro Donning RN Case Mgmt phone [phone]   [facility]

## 2013-09-21 NOTE — Progress Notes (Signed)
ANTICOAGULATION CONSULT NOTE   Pharmacy Consult for Warfarin Indication: VTE prophylaxis  No Known Allergies  Patient Measurements: Height: 5' 10.5" (179.1 cm) Weight: 213 lb (96.616 kg) IBW/kg (Calculated) : 74.15   Vital Signs: Temp: 99.7 F (37.6 C) (08/30 0620) Temp src: Oral (08/30 0620) BP: 128/74 mmHg (08/30 0620) Pulse Rate: 98 (08/30 0620)  Labs:  Recent Labs  09/19/13 0515 09/20/13 0500 09/21/13 0404  HGB 10.4* 10.7* 10.7*  HCT 29.6* 30.8* 30.5*  PLT 142* 156 202  LABPROT 14.5 15.4* 18.4*  INR 1.13 1.22 1.53*  CREATININE 0.68 0.88  --     Estimated Creatinine Clearance: 116.9 ml/min (by C-G formula based on Cr of 0.88).   Medications:  Scheduled:  . atomoxetine  80 mg Oral q morning - 10a  . docusate sodium  100 mg Oral BID  . enoxaparin (LOVENOX) injection  30 mg Subcutaneous Q12H  . hydrochlorothiazide  12.5 mg Oral q morning - 10a  . losartan  50 mg Oral q morning - 10a  . omeprazole  20 mg Oral Daily  . sertraline  50 mg Oral q morning - 10a  . simvastatin  40 mg Oral QHS  . Warfarin - Pharmacist Dosing Inpatient   Does not apply q1800   Warfarin doses:   8/27,  8/28,  8/29   Assessment: 52 yoM s/p bilateral TKA on 8/26.  Pharmacy was consulted to dose warfarin for VTE prophylaxis post op.    Significant Events: 8/27: Low dose warfarin with epidural still in place 8/28: Epidural pulled @ 1415, full dose warfarin started 8/29: Start Lovenox prophylaxis bridge, education completed  8/30: POD4  INR 1.53 remains subtherapeutic, but increasing  CBC:  Hgb 10.7, stable.  Plt remain WNL.  SCr 0.88, CrCl > 100 ml/min  Tolerating regular diet, no bleeding or complications noted  Lovenox bridge,  q12h per MD continues.   Goal of Therapy:  INR 2-3   Plan:   Warfarin 8 mg @ 1800 tonight  Daily PT/INR  Per ortho, continue Lovenox until INR > 2, then d/c.   Lynann Beaver PharmD, BCPS Pager [phone] 09/21/2013 8:04  AM

## 2013-09-21 NOTE — Progress Notes (Signed)
   Subjective: 4 Days Post-Op Procedure(s) (LRB): TOTAL KNEE BILATERAL (Bilateral) Patient reports pain as moderate.   Patient seen in rounds with Dr. Darrelyn Hillock. Patient is well, and has had no acute complaints or problems. Reports that therapy has been going well. Obviously having some discomfort in both knees. No issues overnight. No SOB or chest pain.  Plan is to go Home after hospital stay.  Objective: Vital signs in last 24 hours: Temp:  [99.4 F (37.4 C)-99.7 F (37.6 C)] 99.7 F (37.6 C) (08/30 0620) Pulse Rate:  [96-98] 98 (08/30 0620) Resp:  [16-18] 18 (08/30 0620) BP: (128-138)/(74-79) 128/74 mmHg (08/30 0620) SpO2:  [99 %-100 %] 99 % (08/30 0620)  Intake/Output from previous day:  Intake/Output Summary (Last 24 hours) at 09/21/13 0932 Last data filed at 09/21/13 0650  Gross per 24 hour  Intake 1451.67 ml  Output   5150 ml  Net -3698.33 ml     Labs:  Recent Labs  09/19/13 0515 09/20/13 0500 09/21/13 0404  HGB 10.4* 10.7* 10.7*    Recent Labs  09/20/13 0500 09/21/13 0404  WBC 10.6* 8.6  RBC 3.40* 3.46*  HCT 30.8* 30.5*  PLT 156 202    Recent Labs  09/19/13 0515 09/20/13 0500  NA 139 135*  K 4.0 3.8  CL 104 96  CO2 27 30  BUN 9 8  CREATININE 0.68 0.88  GLUCOSE 106* 99  CALCIUM 8.7 8.9    Recent Labs  09/20/13 0500 09/21/13 0404  INR 1.22 1.53*    EXAM General - Patient is Alert and Oriented Extremity - Neurologically intact Intact pulses distally Dorsiflexion/Plantar flexion intact Compartment soft Dressing/Incision - clean, dry, no drainage Motor Function - intact, moving foot and toes well on exam.   Past Medical History  Diagnosis Date  . Arthritis     PAIN AND OA BOTH KNEES  . Hypercholesterolemia   . History of kidney stones   . Anxiety   . GERD (gastroesophageal reflux disease)   . Hypertension   . ADHD (attention deficit hyperactivity disorder)   . Scoliosis     BACK PAIN    Assessment/Plan: 4 Days Post-Op  Procedure(s) (LRB): TOTAL KNEE BILATERAL (Bilateral) Principal Problem:   OA (osteoarthritis) of knee  Estimated body mass index is 30.12 kg/(m^2) as calculated from the following:   Height as of this encounter: 5' 10.5" (1.791 m).   Weight as of this encounter: 96.616 kg (213 lb). Advance diet Up with therapy D/C IV fluids Discharge home with home health likely tomorrow  DVT Prophylaxis - Lovenox and Coumadin Weight-Bearing as tolerated   He is doing well. Continue PT today. Plan for DC home tomorrow with HHPT. Continue lovenox with coumadin until INR 2-3.   Dimitri Ped, PA-C Orthopaedic Surgery 09/21/2013, 9:32 AM

## 2013-09-22 LAB — PROTIME-INR
INR: 2.13 — ABNORMAL HIGH (ref 0.00–1.49)
Prothrombin Time: 23.8 seconds — ABNORMAL HIGH (ref 11.6–15.2)

## 2013-09-22 MED ORDER — OXYCODONE HCL 5 MG PO TABS: 5.0000 mg | ORAL_TABLET | ORAL | Status: DC | PRN

## 2013-09-22 MED ORDER — WARFARIN SODIUM 4 MG PO TABS
4.0000 mg | ORAL_TABLET | Freq: Once | ORAL | Status: DC
Start: 2013-09-22 — End: 2013-09-22
  Filled 2013-09-22: qty 1

## 2013-09-22 MED ORDER — WARFARIN SODIUM 5 MG PO TABS: 5.0000 mg | ORAL_TABLET | Freq: Every day | ORAL | Status: DC

## 2013-09-22 MED ORDER — METHOCARBAMOL 750 MG PO TABS: 750.0000 mg | ORAL_TABLET | Freq: Four times a day (QID) | ORAL | Status: DC | PRN

## 2013-09-22 NOTE — Progress Notes (Signed)
Noted pt plans to d/c home today as expected. [phone]

## 2013-09-22 NOTE — Progress Notes (Signed)
   Subjective: 5 Days Post-Op Procedure(s) (LRB): TOTAL KNEE BILATERAL (Bilateral) Patient reports pain as mild.   Patient seen in rounds with Dr. Lequita Halt. Doing great this morning. Patient is well, and has had no acute complaints or problems Patient is ready to go home today.  Objective: Vital signs in last 24 hours: Temp:  [98.1 F (36.7 C)-98.6 F (37 C)] 98.1 F (36.7 C) (08/31 0537) Pulse Rate:  [92-101] 92 (08/31 0537) Resp:  [16-18] 16 (08/31 0537) BP: (110-135)/(60-84) 117/60 mmHg (08/31 0537) SpO2:  [97 %-100 %] 100 % (08/31 0537)  Intake/Output from previous day:  Intake/Output Summary (Last 24 hours) at 09/22/13 0737 Last data filed at 09/22/13 0537  Gross per 24 hour  Intake   1040 ml  Output   1600 ml  Net   -560 ml    Intake/Output this shift:    Labs:  Recent Labs  09/20/13 0500 09/21/13 0404  HGB 10.7* 10.7*    Recent Labs  09/20/13 0500 09/21/13 0404  WBC 10.6* 8.6  RBC 3.40* 3.46*  HCT 30.8* 30.5*  PLT 156 202    Recent Labs  09/20/13 0500  NA 135*  K 3.8  CL 96  CO2 30  BUN 8  CREATININE 0.88  GLUCOSE 99  CALCIUM 8.9    Recent Labs  09/21/13 0404 09/22/13 0432  INR 1.53* 2.13*    EXAM: General - Patient is Alert, Appropriate and Oriented Extremity - Neurovascular intact Sensation intact distally Dorsiflexion/Plantar flexion intact Incision - clean, dry, no drainage, healing to both knees Motor Function - intact, moving feet and toes well on exam.   Assessment/Plan: 5 Days Post-Op Procedure(s) (LRB): TOTAL KNEE BILATERAL (Bilateral) Procedure(s) (LRB): TOTAL KNEE BILATERAL (Bilateral) Past Medical History  Diagnosis Date  . Arthritis     PAIN AND OA BOTH KNEES  . Hypercholesterolemia   . History of kidney stones   . Anxiety   . GERD (gastroesophageal reflux disease)   . Hypertension   . ADHD (attention deficit hyperactivity disorder)   . Scoliosis     BACK PAIN   Principal Problem:   OA  (osteoarthritis) of knee  Estimated body mass index is 30.12 kg/(m^2) as calculated from the following:   Height as of this encounter: 5' 10.5" (1.791 m).   Weight as of this encounter: 96.616 kg (213 lb). Discharge home with home health Diet - Cardiac diet Follow up - in 2 weeks Activity - WBAT Disposition - Home Condition Upon Discharge - Good D/C Meds - See DC Summary DVT Prophylaxis - Coumadin - INR today is 2.13  Avel Peace, PA-C Orthopaedic Surgery 09/22/2013, 7:37 AM

## 2013-09-22 NOTE — Discharge Summary (Signed)
Physician Discharge Summary   Patient ID: Mitchell Bell MRN: [id number] DOB/AGE: October 05, 1960 53 y.o.  Admit date: 09/17/2013 Discharge date: 09/22/2013  Primary Diagnosis:  Osteoarthritis Bilateral knee(s)  Admission Diagnoses:  Past Medical History  Diagnosis Date  . Arthritis     PAIN AND OA BOTH KNEES  . Hypercholesterolemia   . History of kidney stones   . Anxiety   . GERD (gastroesophageal reflux disease)   . Hypertension   . ADHD (attention deficit hyperactivity disorder)   . Scoliosis     BACK PAIN   Discharge Diagnoses:   Principal Problem:   OA (osteoarthritis) of knee  Estimated body mass index is 30.12 kg/(m^2) as calculated from the following:   Height as of this encounter: 5' 10.5" (1.791 m).   Weight as of this encounter: 96.616 kg (213 lb).  Procedure:  Procedure(s) (LRB): TOTAL KNEE BILATERAL (Bilateral)   Consults: Anesthesia for Epidural Management and [facility]  HPI: Mitchell Bell is a 53 y.o. year old male with end stage OA of both knees with progressively worsening pain and dysfunction. He has constant pain, with activity and at rest and significant functional deficits with difficulties even with ADLs. He has had extensive non-op management including analgesics, injections of cortisone and viscosupplements, and home exercise program, but remains in significant pain with significant dysfunction. We discussed replacing both knees in the same setting versus one at a time including procedure, risks, potential complications, rehab course, and pros and cons associated with each and the patient elects to do both knees at the same time. He presents now for bilateral Total Knee Arthroplasty.   Laboratory Data: Admission on 09/17/2013, Discharged on 09/22/2013  Component Date Value Ref Range Status  . ABO/RH(D) 09/17/2013 O POS   Final  . Antibody Screen 09/17/2013 NEG   Final  . Sample Expiration 09/17/2013 09/20/2013   Final  . ABO/RH(D)  09/17/2013 O POS   Final  . WBC 09/18/2013 12.8* 4.0 - 10.5 K/uL Final  . RBC 09/18/2013 3.74* 4.22 - 5.81 MIL/uL Final  . Hemoglobin 09/18/2013 11.3* 13.0 - 17.0 g/dL Final  . HCT 09/18/2013 33.3* 39.0 - 52.0 % Final  . MCV 09/18/2013 89.0  78.0 - 100.0 fL Final  . MCH 09/18/2013 30.2  26.0 - 34.0 pg Final  . MCHC 09/18/2013 33.9  30.0 - 36.0 g/dL Final  . RDW 09/18/2013 12.2  11.5 - 15.5 % Final  . Platelets 09/18/2013 169  150 - 400 K/uL Final  . Sodium 09/18/2013 136* 137 - 147 mEq/L Final  . Potassium 09/18/2013 4.2  3.7 - 5.3 mEq/L Final  . Chloride 09/18/2013 102  96 - 112 mEq/L Final  . CO2 09/18/2013 23  19 - 32 mEq/L Final  . Glucose, Bld 09/18/2013 120* 70 - 99 mg/dL Final  . BUN 09/18/2013 11  6 - 23 mg/dL Final  . Creatinine, Ser 09/18/2013 0.78  0.50 - 1.35 mg/dL Final  . Calcium 09/18/2013 8.6  8.4 - 10.5 mg/dL Final  . GFR calc non Af Amer 09/18/2013 >90  >90 mL/min Final  . GFR calc Af Amer 09/18/2013 >90  >90 mL/min Final   Comment: (NOTE)                          The eGFR has been calculated using the CKD EPI equation.  This calculation has not been validated in all clinical situations.                          eGFR's persistently <90 mL/min signify possible Chronic Kidney                          Disease.  . Anion gap 09/18/2013 11  5 - 15 Final  . Prothrombin Time 09/18/2013 15.0  11.6 - 15.2 seconds Final  . INR 09/18/2013 1.18  0.00 - 1.49 Final  . Specimen Description 09/17/2013 URINE, CATHETERIZED   Final  . Special Requests 09/17/2013 NONE   Final  . Culture  Setup Time 09/17/2013    Final                   Value:09/18/2013 13:46                         Performed at [facility]  . Colony Count 09/17/2013    Final                   Value:NO GROWTH                         Performed at [facility]  . Culture 09/17/2013    Final                   Value:NO GROWTH                         Performed at [facility]  . Report Status 09/17/2013 09/19/2013 FINAL   Final  . WBC 09/19/2013 11.2* 4.0 - 10.5 K/uL Final  . RBC 09/19/2013 3.28* 4.22 - 5.81 MIL/uL Final  . Hemoglobin 09/19/2013 10.4* 13.0 - 17.0 g/dL Final  . HCT 09/19/2013 29.6* 39.0 - 52.0 % Final  . MCV 09/19/2013 90.2  78.0 - 100.0 fL Final  . MCH 09/19/2013 31.7  26.0 - 34.0 pg Final  . MCHC 09/19/2013 35.1  30.0 - 36.0 g/dL Final  . RDW 09/19/2013 12.3  11.5 - 15.5 % Final  . Platelets 09/19/2013 142* 150 - 400 K/uL Final  . Sodium 09/19/2013 139  137 - 147 mEq/L Final  . Potassium 09/19/2013 4.0  3.7 - 5.3 mEq/L Final  . Chloride 09/19/2013 104  96 - 112 mEq/L Final  . CO2 09/19/2013 27  19 - 32 mEq/L Final  . Glucose, Bld 09/19/2013 106* 70 - 99 mg/dL Final  . BUN 09/19/2013 9  6 - 23 mg/dL Final  . Creatinine, Ser 09/19/2013 0.68  0.50 - 1.35 mg/dL Final  . Calcium 09/19/2013 8.7  8.4 - 10.5 mg/dL Final  . GFR calc non Af Amer 09/19/2013 >90  >90 mL/min Final  . GFR calc Af Amer 09/19/2013 >90  >90 mL/min Final   Comment: (NOTE)                          The eGFR has been calculated using the CKD EPI equation.                          This calculation has not been validated in all clinical situations.  eGFR's persistently <90 mL/min signify possible Chronic Kidney                          Disease.  . Anion gap 09/19/2013 8  5 - 15 Final  . Prothrombin Time 09/19/2013 14.5  11.6 - 15.2 seconds Final  . INR 09/19/2013 1.13  0.00 - 1.49 Final  . WBC 09/20/2013 10.6* 4.0 - 10.5 K/uL Final  . RBC 09/20/2013 3.40* 4.22 - 5.81 MIL/uL Final  . Hemoglobin 09/20/2013 10.7* 13.0 - 17.0 g/dL Final  . HCT 09/20/2013 30.8* 39.0 - 52.0 % Final  . MCV 09/20/2013 90.6  78.0 - 100.0 fL Final  . MCH 09/20/2013 31.5  26.0 - 34.0 pg Final  . MCHC 09/20/2013 34.7  30.0 - 36.0 g/dL Final  . RDW 09/20/2013 12.6  11.5 - 15.5 % Final  . Platelets 09/20/2013 156  150 - 400 K/uL Final  . Sodium 09/20/2013 135* 137 - 147  mEq/L Final  . Potassium 09/20/2013 3.8  3.7 - 5.3 mEq/L Final  . Chloride 09/20/2013 96  96 - 112 mEq/L Final   DELTA CHECK NOTED  . CO2 09/20/2013 30  19 - 32 mEq/L Final  . Glucose, Bld 09/20/2013 99  70 - 99 mg/dL Final  . BUN 09/20/2013 8  6 - 23 mg/dL Final  . Creatinine, Ser 09/20/2013 0.88  0.50 - 1.35 mg/dL Final  . Calcium 09/20/2013 8.9  8.4 - 10.5 mg/dL Final  . GFR calc non Af Amer 09/20/2013 >90  >90 mL/min Final  . GFR calc Af Amer 09/20/2013 >90  >90 mL/min Final   Comment: (NOTE)                          The eGFR has been calculated using the CKD EPI equation.                          This calculation has not been validated in all clinical situations.                          eGFR's persistently <90 mL/min signify possible Chronic Kidney                          Disease.  . Anion gap 09/20/2013 9  5 - 15 Final  . Prothrombin Time 09/20/2013 15.4* 11.6 - 15.2 seconds Final  . INR 09/20/2013 1.22  0.00 - 1.49 Final  . WBC 09/21/2013 8.6  4.0 - 10.5 K/uL Final  . RBC 09/21/2013 3.46* 4.22 - 5.81 MIL/uL Final  . Hemoglobin 09/21/2013 10.7* 13.0 - 17.0 g/dL Final  . HCT 09/21/2013 30.5* 39.0 - 52.0 % Final  . MCV 09/21/2013 88.2  78.0 - 100.0 fL Final  . MCH 09/21/2013 30.9  26.0 - 34.0 pg Final  . MCHC 09/21/2013 35.1  30.0 - 36.0 g/dL Final  . RDW 09/21/2013 12.1  11.5 - 15.5 % Final  . Platelets 09/21/2013 202  150 - 400 K/uL Final  . Prothrombin Time 09/21/2013 18.4* 11.6 - 15.2 seconds Final  . INR 09/21/2013 1.53* 0.00 - 1.49 Final  . Prothrombin Time 09/22/2013 23.8* 11.6 - 15.2 seconds Final  . INR 09/22/2013 2.13* 0.00 - 1.49 Final  Hospital Outpatient Visit on 09/05/2013  Component Date Value Ref Range Status  . MRSA, PCR  09/05/2013 NEGATIVE  NEGATIVE Final  . Staphylococcus aureus 09/05/2013 NEGATIVE  NEGATIVE Final   Comment:                                 The Xpert SA Assay (FDA                          approved for NASAL specimens                           in patients over 26 years of age),                          is one component of                          a comprehensive surveillance                          program.  Test performance has                          been validated by [employer] for patients greater                          than or equal to 23 year old.                          It is not intended                          to diagnose infection nor to                          guide or monitor treatment.  Marland Kitchen aPTT 09/05/2013 32  24 - 37 seconds Final  . WBC 09/05/2013 4.3  4.0 - 10.5 K/uL Final  . RBC 09/05/2013 4.67  4.22 - 5.81 MIL/uL Final  . Hemoglobin 09/05/2013 14.2  13.0 - 17.0 g/dL Final  . HCT 09/05/2013 41.2  39.0 - 52.0 % Final  . MCV 09/05/2013 88.2  78.0 - 100.0 fL Final  . MCH 09/05/2013 30.4  26.0 - 34.0 pg Final  . MCHC 09/05/2013 34.5  30.0 - 36.0 g/dL Final  . RDW 09/05/2013 12.0  11.5 - 15.5 % Final  . Platelets 09/05/2013 170  150 - 400 K/uL Final  . Sodium 09/05/2013 139  137 - 147 mEq/L Final  . Potassium 09/05/2013 3.8  3.7 - 5.3 mEq/L Final  . Chloride 09/05/2013 103  96 - 112 mEq/L Final  . CO2 09/05/2013 27  19 - 32 mEq/L Final  . Glucose, Bld 09/05/2013 100* 70 - 99 mg/dL Final  . BUN 09/05/2013 18  6 - 23 mg/dL Final  . Creatinine, Ser 09/05/2013 0.82  0.50 - 1.35 mg/dL Final  . Calcium 09/05/2013 9.4  8.4 - 10.5 mg/dL Final  . Total Protein 09/05/2013 6.7  6.0 - 8.3 g/dL Final  . Albumin 09/05/2013 3.7  3.5 -  5.2 g/dL Final  . AST 09/05/2013 16  0 - 37 U/L Final  . ALT 09/05/2013 23  0 - 53 U/L Final  . Alkaline Phosphatase 09/05/2013 71  39 - 117 U/L Final  . Total Bilirubin 09/05/2013 0.9  0.3 - 1.2 mg/dL Final  . GFR calc non Af Amer 09/05/2013 >90  >90 mL/min Final  . GFR calc Af Amer 09/05/2013 >90  >90 mL/min Final   Comment: (NOTE)                          The eGFR has been calculated using the CKD EPI equation.                          This calculation  has not been validated in all clinical situations.                          eGFR's persistently <90 mL/min signify possible Chronic Kidney                          Disease.  . Anion gap 09/05/2013 9  5 - 15 Final  . Prothrombin Time 09/05/2013 12.9  11.6 - 15.2 seconds Final  . INR 09/05/2013 0.97  0.00 - 1.49 Final  . Color, Urine 09/05/2013 YELLOW  YELLOW Final  . APPearance 09/05/2013 CLEAR  CLEAR Final  . Specific Gravity, Urine 09/05/2013 1.021  1.005 - 1.030 Final  . pH 09/05/2013 6.5  5.0 - 8.0 Final  . Glucose, UA 09/05/2013 NEGATIVE  NEGATIVE mg/dL Final  . Hgb urine dipstick 09/05/2013 NEGATIVE  NEGATIVE Final  . Bilirubin Urine 09/05/2013 NEGATIVE  NEGATIVE Final  . Ketones, ur 09/05/2013 NEGATIVE  NEGATIVE mg/dL Final  . Protein, ur 09/05/2013 NEGATIVE  NEGATIVE mg/dL Final  . Urobilinogen, UA 09/05/2013 1.0  0.0 - 1.0 mg/dL Final  . Nitrite 09/05/2013 NEGATIVE  NEGATIVE Final  . Leukocytes, UA 09/05/2013 NEGATIVE  NEGATIVE Final   MICROSCOPIC NOT DONE ON URINES WITH NEGATIVE PROTEIN, BLOOD, LEUKOCYTES, NITRITE, OR GLUCOSE <1000 mg/dL.     X-Rays:Dg Chest 2 View  09/05/2013   CLINICAL DATA:  Preoperative respiratory exam for bilateral knee replacement. Osteoarthritis.  EXAM: CHEST  2 VIEW  COMPARISON:  None.  FINDINGS: Heart size is normal. Mediastinal shadows are normal. The lungs are clear. No effusions. There is curvature in degenerative change of the spine.  IMPRESSION: No active cardiopulmonary disease. Curvature and degenerative change of the spine.   Electronically Signed   By: Nelson Chimes M.D.   On: 09/05/2013 09:12    EKG: Orders placed during the hospital encounter of 09/05/13  . EKG 12-LEAD  . EKG 12-LEAD     Hospital Course: Patient was admitted to [facility] and taken to the OR and underwent the above stated procedure well without complications.  Patient tolerated the procedure well and was later transferred to the recovery room and then to the  orthopaedic floor for postoperative care. Anesthesia was consulted postoperatively to place an epidural in for postoperative pain management. The patient was also given PO and IV analgesics for pain control following their surgery.  They were given 24 hours of postoperative antibiotics and started on DVT prophylaxis in the form of Lovenox and Coumadin after the epidural had been removed.   PT and OT were ordered for total joint protocol.  Discharge planning consulted to help with postop disposition and equipment needs.  Patient had a good night on the evening of surgery with mild pain but started to get up OOB with therapy on day one.  Hemovac drains were pulled without difficulty on day one.  Continued to work with therapy into day two.  Dressings were changed on day two and both incisions were healing well to both legs.  The epidural was removed without difficulty by Anesthesia on day two.  By day three, the patient started to show progress with therapy.  They continued to receive therapy each day for continued total knee protocol.  The incisions were healing well.  They continued to progress on day four and day five at which time the patient was seen in rounds and was ready to go home.   Take Coumadin for 4 weeks and then discontinue.  The dose may need to be adjusted based upon the INR.  Please follow the INR and titrate Coumadin dose for a therapeutic range between 2.0 and 3.0 INR.  After completing the 4 weeks of Coumadin, the patient may stop the Coumadin and resume their 81 mg Aspirin daily.  Discharge home with home health  Diet - Cardiac diet  Follow up - in 2 weeks  Activity - WBAT  Disposition - Home  Condition Upon Discharge - Good  D/C Meds - See DC Summary  DVT Prophylaxis - Coumadin - INR today is 2.13      Medication List    STOP taking these medications       aspirin EC 81 MG tablet     ibuprofen 200 MG tablet  Commonly known as:  ADVIL,MOTRIN     naproxen sodium 220 MG  tablet  Commonly known as:  ANAPROX     OMEGA-3 KRILL OIL PO      TAKE these medications       atomoxetine 80 MG capsule  Commonly known as:  STRATTERA  Take 80 mg by mouth every morning.     hydrochlorothiazide 12.5 MG capsule  Commonly known as:  MICROZIDE  Take 12.5 mg by mouth every morning.     losartan 50 MG tablet  Commonly known as:  COZAAR  Take 50 mg by mouth every morning.     methocarbamol 750 MG tablet  Commonly known as:  ROBAXIN  Take 1 tablet (750 mg total) by mouth every 6 (six) hours as needed for muscle spasms.     omeprazole 20 MG capsule  Commonly known as:  PRILOSEC  Take 20 mg by mouth every morning.     oxyCODONE 5 MG immediate release tablet  Commonly known as:  Oxy IR/ROXICODONE  Take 1-4 tablets (5-20 mg total) by mouth every 3 (three) hours as needed for moderate pain, severe pain or breakthrough pain.     sertraline 50 MG tablet  Commonly known as:  ZOLOFT  Take 50 mg by mouth every morning.     simvastatin 40 MG tablet  Commonly known as:  ZOCOR  Take 40 mg by mouth at bedtime.     warfarin 5 MG tablet  Commonly known as:  COUMADIN  Take 1 tablet (5 mg total) by mouth daily. Take Coumadin for four weeks and then discontinue.  The dose may need to be adjusted based upon the INR.  Please follow the INR and titrate Coumadin dose for a therapeutic range between 2.0 and 3.0 INR.  After completing the four weeks of Coumadin, the patient may  stop the Coumadin and resume their 81 mg Aspirin daily.       Follow-up Information   Follow up with Gearlean Alf, MD. Schedule an appointment as soon as possible for a visit in 2 weeks.   Specialty:  Orthopedic Surgery   Contact information:   [address] [place] [address] [phone]       Signed: Arlee Muslim, PA-C Orthopaedic Surgery 09/25/2013, 9:31 AM

## 2013-09-22 NOTE — Progress Notes (Signed)
Physical Therapy Treatment Patient Details Name: Mitchell Bell MRN: [id number] DOB: November 23, 1960 Today's Date: 09/22/2013    History of Present Illness s/p bil TKA    PT Comments    Progressing well. Plans DC today to home after practicing steps.  Follow Up Recommendations  Home health PT     Equipment Recommendations  None recommended by PT    Recommendations for Other Services       Precautions / Restrictions Precautions Precautions: Knee Restrictions Weight Bearing Restrictions: No    Mobility  Bed Mobility                  Transfers                    Ambulation/Gait                 Stairs            Wheelchair Mobility    Modified Rankin (Stroke Patients Only)       Balance                                    Cognition Arousal/Alertness: Awake/alert                          Exercises Total Joint Exercises Ankle Circles/Pumps: AROM;Both;15 reps;Supine Quad Sets: AROM;Both;Supine;20 reps Short Arc Quad: AROM;Both;15 reps;Supine Heel Slides: AAROM;Both;Supine;20 reps Hip ABduction/ADduction: AROM;Both;15 reps Straight Leg Raises: AROM;Both;15 reps Goniometric ROM: L 8-80, R 090    General Comments        Pertinent Vitals/Pain Pain Score: 2  Pain Descriptors / Indicators: Aching;Sore;Tightness    Home Living                      Prior Function            PT Goals (current goals can now be found in the care plan section) Progress towards PT goals: Progressing toward goals    Frequency  7X/week    PT Plan Current plan remains appropriate    Co-evaluation             End of Session   Activity Tolerance: Patient tolerated treatment well Patient left: in bed;with call bell/phone within reach     Time: 6295-2841 PT Time Calculation (min): 31 min  Charges:  $Therapeutic Exercise: 23-37 mins                    G Codes:      Rada Hay 09/22/2013,  11:10 AM Blanchard Kelch PT [phone]

## 2013-09-22 NOTE — Progress Notes (Signed)
Occupational Therapy Treatment Patient Details Name: FLAVIO LINDROTH MRN: [id number] DOB: May 06, 1960 Today's Date: 09/22/2013    History of present illness s/p bil TKA   OT comments  Plans to Dc today  Follow Up Recommendations  No OT follow up    Equipment Recommendations  Toilet rise with handles    Recommendations for Other Services      Precautions / Restrictions Precautions Precautions: Knee Restrictions Other Position/Activity Restrictions: WBAT       Mobility Bed Mobility Overal bed mobility: Needs Assistance Bed Mobility: Supine to Sit;Sit to Supine     Supine to sit: Supervision Sit to supine: Min assist      Transfers Overall transfer level: Needs assistance Equipment used: Rolling walker (2 wheeled)   Sit to Stand: Min assist;From elevated surface         General transfer comment: cues for transition position and use of UEs (one on bed, one on RW) to self assist from EOB        ADL Overall ADL's : Needs assistance/impaired                                       General ADL Comments: Educated pt and wife on elevated toilet seat in which will raise toilet up in order for pt to be most I getting up and down from seat.       Cognition   Behavior During Therapy: WFL for tasks assessed/performed Overall Cognitive Status: Within Functional Limits for tasks assessed                                    Pertinent Vitals/ Pain       Pain Score: 2  Pain Location: bil knees Pain Descriptors / Indicators: Aching;Sore;Tightness      Progress Toward Goals  OT Goals(current goals can now be found in the care plan section)   yes!     Plan Discharge plan remains appropriate       End of Session Equipment Utilized During Treatment: Rolling walker   Activity Tolerance Patient tolerated treatment well   Patient Left in bed;with call bell/phone within reach;with family/visitor present   Nurse Communication Mobility  status        Time: 1045-1105 OT Time Calculation (min): 20 min  Charges: OT General Charges $OT Visit: 1 Procedure OT Evaluation $Initial OT Evaluation Tier I: 1 Procedure OT Treatments $Self Care/Home Management : 8-22 mins  Jeffree Cazeau D 09/22/2013, 11:19 AM

## 2013-09-22 NOTE — Progress Notes (Signed)
RN reviewed discharge instructions with patient and family. Patient stated understanding of pain management, blood thinner medication, Coumadin and blood draws related to blood thinner, physical therapy, bowel habits, and medications, and activity progression.   All other questions answered. Prescriptions ans paperwork given.  NT rolled patient down to family car.

## 2013-09-22 NOTE — Discharge Instructions (Signed)
° °Dr. Frank Aluisio °Total Joint Specialist °[facility] °[address] °[place], [place] [address] °[phone] ° °TOTAL KNEE REPLACEMENT POSTOPERATIVE DIRECTIONS ° ° ° °Knee Rehabilitation, Guidelines Following Surgery  °Results after knee surgery are often greatly improved when you follow the exercise, range of motion and muscle strengthening exercises prescribed by your doctor. Safety measures are also important to protect the knee from further injury. Any time any of these exercises cause you to have increased pain or swelling in your knee joint, decrease the amount until you are comfortable again and slowly increase them. If you have problems or questions, call your caregiver or physical therapist for advice.  ° °HOME CARE INSTRUCTIONS  °Remove items at home which could result in a fall. This includes throw rugs or furniture in walking pathways.  °Continue medications as instructed at time of discharge. °You may have some home medications which will be placed on hold until you complete the course of blood thinner medication.  °You may start showering once you are discharged home but do not submerge the incision under water. Just pat the incision dry and apply a dry gauze dressing on daily. °Walk with walker as instructed.  °You may resume a sexual relationship in one month or when given the OK by  your doctor.  °· Use walker as long as suggested by your caregivers. °· Avoid periods of inactivity such as sitting longer than an hour when not asleep. This helps prevent blood clots.  °You may put full weight on your legs and walk as much as is comfortable.  °You may return to work once you are cleared by your doctor.  °Do not drive a car for 6 weeks or until released by you surgeon.  °· Do not drive while taking narcotics.  °Wear the elastic stockings for three weeks following surgery during the day but you may remove then at night. °Make sure you keep all of your appointments after your  operation with all of your doctors and caregivers. You should call the office at the above phone number and make an appointment for approximately two weeks after the date of your surgery. °Change the dressing daily and reapply a dry dressing each time. °Please pick up a stool softener and laxative for home use as long as you are requiring pain medications. °· Continue to use ice on the knee for pain and swelling from surgery. You may notice swelling that will progress down to the foot and ankle.  This is normal after surgery.  Elevate the leg when you are not up walking on it.   °It is important for you to complete the blood thinner medication as prescribed by your doctor. °· Continue to use the breathing machine which will help keep your temperature down.  It is common for your temperature to cycle up and down following surgery, especially at night when you are not up moving around and exerting yourself.  The breathing machine keeps your lungs expanded and your temperature down. ° °RANGE OF MOTION AND STRENGTHENING EXERCISES  °Rehabilitation of the knee is important following a knee injury or an operation. After just a few days of immobilization, the muscles of the thigh which control the knee become weakened and shrink (atrophy). Knee exercises are designed to build up the tone and strength of the thigh muscles and to improve knee motion. Often times heat used for twenty to thirty minutes before working out will loosen up your tissues and help with improving the   range of motion but do not use heat for the first two weeks following surgery. These exercises can be done on a training (exercise) mat, on the floor, on a table or on a bed. Use what ever works the best and is most comfortable for you Knee exercises include:  Leg Lifts - While your knee is still immobilized in a splint or cast, you can do straight leg raises. Lift the leg to 60 degrees, hold for 3 sec, and slowly lower the leg. Repeat 10-20 times 2-3  times daily. Perform this exercise against resistance later as your knee gets better.  Quad and Hamstring Sets - Tighten up the muscle on the front of the thigh (Quad) and hold for 5-10 sec. Repeat this 10-20 times hourly. Hamstring sets are done by pushing the foot backward against an object and holding for 5-10 sec. Repeat as with quad sets.  A rehabilitation program following serious knee injuries can speed recovery and prevent re-injury in the future due to weakened muscles. Contact your doctor or a physical therapist for more information on knee rehabilitation.   SKILLED REHAB INSTRUCTIONS: If the patient is transferred to a skilled rehab facility following release from the hospital, a list of the current medications will be sent to the facility for the patient to continue.  When discharged from the skilled rehab facility, please have the facility set up the patient's Home Health Physical Therapy prior to being released. Also, the skilled facility will be responsible for providing the patient with their medications at time of release from the facility to include their pain medication, the muscle relaxants, and their blood thinner medication. If the patient is still at the rehab facility at time of the two week follow up appointment, the skilled rehab facility will also need to assist the patient in arranging follow up appointment in our office and any transportation needs.  MAKE SURE YOU:  Understand these instructions.  Will watch your condition.  Will get help right away if you are not doing well or get worse.    Pick up stool softner and laxative for home. Do not submerge incision under water. May shower. Continue to use ice for pain and swelling from surgery.  Take Coumadin for four weeks and then discontinue.  The dose may need to be adjusted based upon the INR.  Please follow the INR and titrate Coumadin dose for a therapeutic range between 2.0 and 3.0 INR.  After completing the four  weeks of Coumadin, the patient may stop the Coumadin and resume their 81 mg Aspirin daily.

## 2013-09-22 NOTE — Progress Notes (Signed)
Physical Therapy Treatment Patient Details Name: Mitchell Bell MRN: [id number] DOB: [birth year] Today's Date: 09/22/2013    History of Present Illness s/p bil TKA    PT Comments    Ready for DC  Follow Up Recommendations  Home health PT     Equipment Recommendations  None recommended by PT    Recommendations for Other Services       Precautions / Restrictions Precautions Precautions: Knee Restrictions Other Position/Activity Restrictions: WBAT    Mobility  Bed Mobility Overal bed mobility: Modified Independent Bed Mobility: Supine to Sit;Sit to Supine     Supine to sit: Supervision Sit to supine: Min assist      Transfers Overall transfer level: Needs assistance Equipment used: Rolling walker (2 wheeled) Transfers: Sit to/from Stand Sit to Stand: Supervision         General transfer comment: extra time  for coming from low bed but was able to get self up  Ambulation/Gait Ambulation/Gait assistance: Min guard Ambulation Distance (Feet): 60 Feet Assistive device: Rolling walker (2 wheeled) Gait Pattern/deviations: Step-to pattern Gait velocity: decr   General Gait Details: min cues for posture and position from RW   Stairs Stairs: Yes Stairs assistance: Min assist Stair Management: One rail Right;Forwards;With crutches Number of Stairs: 2 General stair comments: cues for sequencing, foot placement and hand placement  Wheelchair Mobility    Modified Rankin (Stroke Patients Only)       Balance                                    Cognition Arousal/Alertness: Awake/alert Behavior During Therapy: WFL for tasks assessed/performed Overall Cognitive Status: Within Functional Limits for tasks assessed                      Exercises Total Joint Exercises Ankle Circles/Pumps: AROM;Both;15 reps;Supine Quad Sets: AROM;Both;Supine;20 reps Short Arc Quad: AROM;Both;15 reps;Supine Heel Slides: AAROM;Both;Supine;20 reps Hip  ABduction/ADduction: AROM;Both;15 reps Straight Leg Raises: AROM;Both;15 reps Goniometric ROM: L 8-80, R 090    General Comments        Pertinent Vitals/Pain Pain Score: 2  Pain Location: bil knees Pain Descriptors / Indicators: Aching;Sore;Tightness    Home Living                      Prior Function            PT Goals (current goals can now be found in the care plan section) Progress towards PT goals: Progressing toward goals    Frequency  7X/week    PT Plan Current plan remains appropriate    Co-evaluation             End of Session Equipment Utilized During Treatment: Gait belt Activity Tolerance: Patient tolerated treatment well Patient left:  (up with wife in room)     Time: 1115-1140 PT Time Calculation (min): 25 min  Charges:  $Gait Training: 23-37 mins $Therapeutic Exercise: 23-37 mins                    G Codes:      Rada Hay 09/22/2013, 1:09 PM

## 2013-09-22 NOTE — Progress Notes (Signed)
ANTICOAGULATION CONSULT NOTE   Pharmacy Consult for Warfarin Indication: VTE prophylaxis  No Known Allergies  Patient Measurements: Height: 5' 10.5" (179.1 cm) Weight: 213 lb (96.616 kg) IBW/kg (Calculated) : 74.15   Vital Signs: Temp: 98.1 F (36.7 C) (08/31 0537) Temp src: Axillary (08/31 0537) BP: 117/60 mmHg (08/31 0537) Pulse Rate: 92 (08/31 0537)  Labs:  Recent Labs  09/20/13 0500 09/21/13 0404 09/22/13 0432  HGB 10.7* 10.7*  --   HCT 30.8* 30.5*  --   PLT 156 202  --   LABPROT 15.4* 18.4* 23.8*  INR 1.22 1.53* 2.13*  CREATININE 0.88  --   --     Estimated Creatinine Clearance: 116.9 ml/min (by C-G formula based on Cr of 0.88).   Medications:  Scheduled:  . atomoxetine  80 mg Oral q morning - 10a  . docusate sodium  100 mg Oral BID  . enoxaparin (LOVENOX) injection  30 mg Subcutaneous Q12H  . hydrochlorothiazide  12.5 mg Oral q morning - 10a  . losartan  50 mg Oral q morning - 10a  . omeprazole  20 mg Oral Daily  . sertraline  50 mg Oral q morning - 10a  . simvastatin  40 mg Oral QHS  . Warfarin - Pharmacist Dosing Inpatient   Does not apply q1800   Warfarin doses:   8/27,  8/28,  8/29   Assessment: 52 yoM s/p bilateral TKA on 8/26.  Pharmacy was consulted to dose warfarin for VTE prophylaxis post op.    Significant Events: 8/27: Low dose warfarin with epidural still in place 8/28: Epidural pulled @ 1415, full dose warfarin started 8/29: Start Lovenox prophylaxis bridge, education completed  8/31: POD5  INR 2.13, therapeutic  CBC:  Hgb 10.7, stable.  Plt remain WNL.  SCr 0.88, CrCl > 100 ml/min  Tolerating regular diet, no bleeding or complications noted  Lovenox bridge,  q12h per MD continues.   Goal of Therapy:  INR 2-3   Plan:   Warfarin  po x 1 tonight  Daily PT/INR  D/C lovenox as INR now >2   Haynes Hoehn, PharmD, BCPS 09/22/2013, 9:22 AM  Pager: [phone]

## 2014-02-06 ENCOUNTER — Ambulatory Visit (HOSPITAL_COMMUNITY)
Admission: RE | Admit: 2014-02-06 | Discharge: 2014-02-06 | Disposition: A | Discharge status: Home / Self Care | Payer: BLUE CROSS/BLUE SHIELD | Source: Ambulatory Visit | Attending: Cardiovascular Disease | Admitting: Cardiovascular Disease

## 2014-02-06 ENCOUNTER — Other Ambulatory Visit (HOSPITAL_COMMUNITY): Payer: Self-pay | Admitting: Orthopedic Surgery

## 2014-02-06 DIAGNOSIS — M9711XA Periprosthetic fracture around internal prosthetic right knee joint, initial encounter: Secondary | ICD-10-CM

## 2014-02-06 DIAGNOSIS — M7121 Synovial cyst of popliteal space [Baker], right knee: Secondary | ICD-10-CM | POA: Diagnosis not present

## 2014-02-06 DIAGNOSIS — Z96651 Presence of right artificial knee joint: Secondary | ICD-10-CM | POA: Diagnosis not present

## 2014-02-06 DIAGNOSIS — M79661 Pain in right lower leg: Secondary | ICD-10-CM | POA: Diagnosis present

## 2014-02-06 NOTE — Progress Notes (Signed)
Right Lower Ext. Venous Duplex Completed. Negative for DVT or SVT. A Baker's Cyst was noted measuring approximately 5.0 x 3.5 x 1.7 cm. Marilynne Halstedita Paizlie Klaus, BS, RDMS, RVT

## 2014-02-19 ENCOUNTER — Telehealth (HOSPITAL_COMMUNITY): Payer: Self-pay | Admitting: *Deleted

## 2014-09-08 ENCOUNTER — Ambulatory Visit: Payer: Self-pay | Admitting: Orthopedic Surgery

## 2014-09-08 NOTE — Progress Notes (Signed)
Abbreviations noted per sugical consent order. Please clarify. Pt has PAT appt for tomorrow 09/09/2014. Thanks.

## 2014-09-09 ENCOUNTER — Encounter (HOSPITAL_COMMUNITY): Payer: Self-pay

## 2014-09-09 ENCOUNTER — Encounter (HOSPITAL_COMMUNITY)
Admission: RE | Admit: 2014-09-09 | Discharge: 2014-09-09 | Disposition: A | Discharge status: Home / Self Care | Payer: BLUE CROSS/BLUE SHIELD | Source: Ambulatory Visit | Attending: Orthopedic Surgery | Admitting: Orthopedic Surgery

## 2014-09-09 DIAGNOSIS — W010XXA Fall on same level from slipping, tripping and stumbling without subsequent striking against object, initial encounter: Secondary | ICD-10-CM | POA: Diagnosis not present

## 2014-09-09 DIAGNOSIS — Z7982 Long term (current) use of aspirin: Secondary | ICD-10-CM | POA: Diagnosis not present

## 2014-09-09 DIAGNOSIS — F909 Attention-deficit hyperactivity disorder, unspecified type: Secondary | ICD-10-CM | POA: Diagnosis not present

## 2014-09-09 DIAGNOSIS — I10 Essential (primary) hypertension: Secondary | ICD-10-CM | POA: Diagnosis not present

## 2014-09-09 DIAGNOSIS — K219 Gastro-esophageal reflux disease without esophagitis: Secondary | ICD-10-CM | POA: Diagnosis not present

## 2014-09-09 DIAGNOSIS — Y9341 Activity, dancing: Secondary | ICD-10-CM | POA: Diagnosis not present

## 2014-09-09 DIAGNOSIS — Z87442 Personal history of urinary calculi: Secondary | ICD-10-CM | POA: Diagnosis not present

## 2014-09-09 DIAGNOSIS — T84042A Periprosthetic fracture around internal prosthetic right knee joint, initial encounter: Secondary | ICD-10-CM | POA: Diagnosis not present

## 2014-09-09 DIAGNOSIS — Y9289 Other specified places as the place of occurrence of the external cause: Secondary | ICD-10-CM | POA: Diagnosis not present

## 2014-09-09 DIAGNOSIS — E780 Pure hypercholesterolemia: Secondary | ICD-10-CM | POA: Diagnosis not present

## 2014-09-09 DIAGNOSIS — M419 Scoliosis, unspecified: Secondary | ICD-10-CM | POA: Diagnosis not present

## 2014-09-09 HISTORY — DX: Personal history of other infectious and parasitic diseases: Z86.19

## 2014-09-09 HISTORY — DX: Esophageal obstruction: K22.2

## 2014-09-09 LAB — CBC
HCT: 42.7 % (ref 39.0–52.0)
Hemoglobin: 14.7 g/dL (ref 13.0–17.0)
MCH: 31.2 pg (ref 26.0–34.0)
MCHC: 34.4 g/dL (ref 30.0–36.0)
MCV: 90.7 fL (ref 78.0–100.0)
Platelets: 191 10*3/uL (ref 150–400)
RBC: 4.71 MIL/uL (ref 4.22–5.81)
RDW: 12.1 % (ref 11.5–15.5)
WBC: 5.6 10*3/uL (ref 4.0–10.5)

## 2014-09-09 LAB — BASIC METABOLIC PANEL
ANION GAP: 6 (ref 5–15)
BUN: 14 mg/dL (ref 6–20)
CALCIUM: 9.7 mg/dL (ref 8.9–10.3)
CO2: 30 mmol/L (ref 22–32)
CREATININE: 0.87 mg/dL (ref 0.61–1.24)
Chloride: 104 mmol/L (ref 101–111)
Glucose, Bld: 100 mg/dL — ABNORMAL HIGH (ref 65–99)
Potassium: 4 mmol/L (ref 3.5–5.1)
SODIUM: 140 mmol/L (ref 135–145)

## 2014-09-09 NOTE — Progress Notes (Signed)
Immobilizer per right leg

## 2014-09-09 NOTE — Patient Instructions (Addendum)
Mitchell Bell  09/09/2014   Your procedure is scheduled on: Thursday September 10, 2014  Report to [facility] Main  Entrance take [place]  elevators to 3rd floor to  Short Stay Center at 3:45 PM.  Call this number if you have problems the morning of surgery [phone]   Remember: ONLY 1 PERSON MAY GO WITH YOU TO SHORT STAY TO GET  READY MORNING OF YOUR SURGERY.    CLEAR LIQUID DIET   Foods Allowed                                                                     Foods Excluded  Coffee and tea, regular and decaf                             liquids that you cannot  Plain Jell-O in any flavor                                             see through such as: Fruit ices (not with fruit pulp)                                     milk, soups, orange juice  Iced Popsicles                                    All solid food Carbonated beverages, regular and diet                                    Cranberry, grape and apple juices Sports drinks like Gatorade Lightly seasoned clear broth or consume(fat free) Sugar, honey syrup  Sample Menu Breakfast                                Lunch                                     Supper Cranberry juice                    Beef broth                            Chicken broth Jell-O                                     Grape juice                           Apple juice Coffee or tea  Jell-O                                      Popsicle                                                Coffee or tea                        Coffee or tea  _____________________________________________________________________    Do not eat food After Midnight but may take clear liquids till 11:45 am day of surgery then nothing by mouth.    Take these medicines the morning of surgery with A SIP OF WATER: Omeprazole (Prilosec)                               You may not have any metal on your body including hair pins and               piercings  Do not wear jewelry, lotions, powders or colognes, deodorant             Men may shave face and neck day of surgery.                Do not bring valuables to the hospital. [place] IS NOT             RESPONSIBLE   FOR VALUABLES.  Contacts, dentures or bridgework may not be worn into surgery.  Leave suitcase in the car. After surgery it may be brought to your room.                  Please read over the following fact sheets you were given:Incentive Spirometer  _____________________________________________________________________             [facility] - Preparing for Surgery Before surgery, you can play an important role.  Because skin is not sterile, your skin needs to be as free of germs as possible.  You can reduce the number of germs on your skin by washing with CHG (chlorahexidine gluconate) soap before surgery.  CHG is an antiseptic cleaner which kills germs and bonds with the skin to continue killing germs even after washing. Please DO NOT use if you have an allergy to CHG or antibacterial soaps.  If your skin becomes reddened/irritated stop using the CHG and inform your nurse when you arrive at Short Stay. Do not shave (including legs and underarms) for at least 48 hours prior to the first CHG shower.  You may shave your face/neck. Please follow these instructions carefully:  1.  Shower with CHG Soap the night before surgery and the  morning of Surgery.  2.  If you choose to wash your hair, wash your hair first as usual with your  normal  shampoo.  3.  After you shampoo, rinse your hair and body thoroughly to remove the  shampoo.                           4.  Use CHG as you would any other liquid soap.  You can apply chg directly  to the skin and wash  Gently with a scrungie or clean washcloth.  5.  Apply the CHG Soap to your body ONLY FROM THE NECK DOWN.   Do not use on face/ open                           Wound or open sores. Avoid contact with  eyes, ears mouth and genitals (private parts).                       Wash face,  Genitals (private parts) with your normal soap.             6.  Wash thoroughly, paying special attention to the area where your surgery  will be performed.  7.  Thoroughly rinse your body with warm water from the neck down.  8.  DO NOT shower/wash with your normal soap after using and rinsing off  the CHG Soap.                9.  Pat yourself dry with a clean towel.            10.  Wear clean pajamas.            11.  Place clean sheets on your bed the night of your first shower and do not  sleep with pets. Day of Surgery : Do not apply any lotions/deodorants the morning of surgery.  Please wear clean clothes to the hospital/surgery center.  FAILURE TO FOLLOW THESE INSTRUCTIONS MAY RESULT IN THE CANCELLATION OF YOUR SURGERY PATIENT SIGNATURE_________________________________  NURSE SIGNATURE__________________________________  ________________________________________________________________________   Mitchell Bell  An incentive spirometer is a tool that can help keep your lungs clear and active. This tool measures how well you are filling your lungs with each breath. Taking long deep breaths may help reverse or decrease the chance of developing breathing (pulmonary) problems (especially infection) following:  A long period of time when you are unable to move or be active. BEFORE THE PROCEDURE   If the spirometer includes an indicator to show your best effort, your nurse or respiratory therapist will set it to a desired goal.  If possible, sit up straight or lean slightly forward. Try not to slouch.  Hold the incentive spirometer in an upright position. INSTRUCTIONS FOR USE  1. Sit on the edge of your bed if possible, or sit up as far as you can in bed or on a chair. 2. Hold the incentive spirometer in an upright position. 3. Breathe out normally. 4. Place the mouthpiece in your mouth and seal your  lips tightly around it. 5. Breathe in slowly and as deeply as possible, raising the piston or the ball toward the top of the column. 6. Hold your breath for 3-5 seconds or for as long as possible. Allow the piston or ball to fall to the bottom of the column. 7. Remove the mouthpiece from your mouth and breathe out normally. 8. Rest for a few seconds and repeat Steps 1 through 7 at least 10 times every 1-2 hours when you are awake. Take your time and take a few normal breaths between deep breaths. 9. The spirometer may include an indicator to show your best effort. Use the indicator as a goal to work toward during each repetition. 10. After each set of 10 deep breaths, practice coughing to be sure your lungs are clear. If you have an incision (the cut made at the time of  surgery), support your incision when coughing by placing a pillow or rolled up towels firmly against it. Once you are able to get out of bed, walk around indoors and cough well. You may stop using the incentive spirometer when instructed by your caregiver.  RISKS AND COMPLICATIONS  Take your time so you do not get dizzy or light-headed.  If you are in pain, you may need to take or ask for pain medication before doing incentive spirometry. It is harder to take a deep breath if you are having pain. AFTER USE  Rest and breathe slowly and easily.  It can be helpful to keep track of a log of your progress. Your caregiver can provide you with a simple table to help with this. If you are using the spirometer at home, follow these instructions: Supreme IF:   You are having difficultly using the spirometer.  You have trouble using the spirometer as often as instructed.  Your pain medication is not giving enough relief while using the spirometer.  You develop fever of 100.5 F (38.1 C) or higher. SEEK IMMEDIATE MEDICAL CARE IF:   You cough up bloody sputum that had not been present before.  You develop fever of 102 F  (38.9 C) or greater.  You develop worsening pain at or near the incision site. MAKE SURE YOU:   Understand these instructions.  Will watch your condition.  Will get help right away if you are not doing well or get worse. Document Released: 05/22/2006 Document Revised: 04/03/2011 Document Reviewed: 07/23/2006 [facility] Patient Information 2014 [place], [place].   ________________________________________________________________________

## 2014-09-09 NOTE — Progress Notes (Signed)
Abbreviations noted per surgical consent. Please clarify. Thanks. 

## 2014-09-09 NOTE — Progress Notes (Signed)
Your patient has screened at an elevated risk for Obstructive Sleep Apnea using the Stop-Bang Tool during a pre-surgical vist. A score of 4 or greater is an elevated risk. Score of 4. 

## 2014-09-10 ENCOUNTER — Ambulatory Visit (HOSPITAL_COMMUNITY): Payer: BLUE CROSS/BLUE SHIELD | Admitting: Registered Nurse

## 2014-09-10 ENCOUNTER — Observation Stay (HOSPITAL_COMMUNITY)
Admission: RE | Admit: 2014-09-10 | Discharge: 2014-09-11 | Disposition: A | Discharge status: Home / Self Care | Payer: BLUE CROSS/BLUE SHIELD | Source: Ambulatory Visit | Attending: Orthopedic Surgery | Admitting: Orthopedic Surgery

## 2014-09-10 ENCOUNTER — Encounter (HOSPITAL_COMMUNITY): Payer: Self-pay | Admitting: *Deleted

## 2014-09-10 ENCOUNTER — Encounter (HOSPITAL_COMMUNITY)
Admission: RE | Disposition: A | Discharge status: Home / Self Care | Payer: Self-pay | Source: Ambulatory Visit | Attending: Orthopedic Surgery

## 2014-09-10 ENCOUNTER — Ambulatory Visit (HOSPITAL_COMMUNITY): Payer: BLUE CROSS/BLUE SHIELD

## 2014-09-10 DIAGNOSIS — F909 Attention-deficit hyperactivity disorder, unspecified type: Secondary | ICD-10-CM | POA: Insufficient documentation

## 2014-09-10 DIAGNOSIS — T84042A Periprosthetic fracture around internal prosthetic right knee joint, initial encounter: Secondary | ICD-10-CM | POA: Diagnosis not present

## 2014-09-10 DIAGNOSIS — W010XXA Fall on same level from slipping, tripping and stumbling without subsequent striking against object, initial encounter: Secondary | ICD-10-CM | POA: Insufficient documentation

## 2014-09-10 DIAGNOSIS — Y9341 Activity, dancing: Secondary | ICD-10-CM | POA: Insufficient documentation

## 2014-09-10 DIAGNOSIS — Z96659 Presence of unspecified artificial knee joint: Secondary | ICD-10-CM

## 2014-09-10 DIAGNOSIS — K219 Gastro-esophageal reflux disease without esophagitis: Secondary | ICD-10-CM | POA: Insufficient documentation

## 2014-09-10 DIAGNOSIS — M419 Scoliosis, unspecified: Secondary | ICD-10-CM | POA: Insufficient documentation

## 2014-09-10 DIAGNOSIS — Z7982 Long term (current) use of aspirin: Secondary | ICD-10-CM | POA: Insufficient documentation

## 2014-09-10 DIAGNOSIS — E780 Pure hypercholesterolemia: Secondary | ICD-10-CM | POA: Insufficient documentation

## 2014-09-10 DIAGNOSIS — Z87442 Personal history of urinary calculi: Secondary | ICD-10-CM | POA: Insufficient documentation

## 2014-09-10 DIAGNOSIS — S82009A Unspecified fracture of unspecified patella, initial encounter for closed fracture: Secondary | ICD-10-CM | POA: Diagnosis present

## 2014-09-10 DIAGNOSIS — Y9289 Other specified places as the place of occurrence of the external cause: Secondary | ICD-10-CM | POA: Insufficient documentation

## 2014-09-10 DIAGNOSIS — I10 Essential (primary) hypertension: Secondary | ICD-10-CM | POA: Insufficient documentation

## 2014-09-10 DIAGNOSIS — M978XXA Periprosthetic fracture around other internal prosthetic joint, initial encounter: Secondary | ICD-10-CM

## 2014-09-10 HISTORY — PX: ORIF PATELLA: SHX5033

## 2014-09-10 SURGERY — OPEN REDUCTION INTERNAL FIXATION (ORIF) PATELLA
Anesthesia: Spinal | Site: Knee | Laterality: Right

## 2014-09-10 MED ORDER — ONDANSETRON HCL 4 MG/2ML IJ SOLN: 4.0000 mg | Freq: Four times a day (QID) | INTRAMUSCULAR | Status: DC | PRN

## 2014-09-10 MED ORDER — LACTATED RINGERS IV SOLN
INTRAVENOUS | Status: DC
  Administered 2014-09-10: 20:00:00 via INTRAVENOUS
  Administered 2014-09-10: 1000 mL via INTRAVENOUS

## 2014-09-10 MED ORDER — CEFAZOLIN SODIUM-DEXTROSE 2-3 GM-% IV SOLR
INTRAVENOUS | Status: AC
  Filled 2014-09-10: qty 50

## 2014-09-10 MED ORDER — LOSARTAN POTASSIUM 50 MG PO TABS
50.0000 mg | ORAL_TABLET | Freq: Every morning | ORAL | Status: DC
  Administered 2014-09-11: 50 mg via ORAL
  Filled 2014-09-10: qty 1

## 2014-09-10 MED ORDER — CHLORHEXIDINE GLUCONATE 4 % EX LIQD: 60.0000 mL | Freq: Once | CUTANEOUS | Status: DC

## 2014-09-10 MED ORDER — LIDOCAINE HCL (CARDIAC) 20 MG/ML IV SOLN
INTRAVENOUS | Status: AC
Start: 2014-09-10 — End: 2014-09-10
  Filled 2014-09-10: qty 5

## 2014-09-10 MED ORDER — PANTOPRAZOLE SODIUM 40 MG PO TBEC
40.0000 mg | DELAYED_RELEASE_TABLET | Freq: Every day | ORAL | Status: DC
Start: 2014-09-11 — End: 2014-09-11
  Administered 2014-09-11: 40 mg via ORAL
  Filled 2014-09-10: qty 1

## 2014-09-10 MED ORDER — SODIUM CHLORIDE 0.9 % IV SOLN
INTRAVENOUS | Status: DC
  Administered 2014-09-10: 22:00:00 via INTRAVENOUS

## 2014-09-10 MED ORDER — BUPIVACAINE HCL (PF) 0.25 % IJ SOLN
INTRAMUSCULAR | Status: AC
  Filled 2014-09-10: qty 30

## 2014-09-10 MED ORDER — METOCLOPRAMIDE HCL 10 MG PO TABS: 5.0000 mg | ORAL_TABLET | Freq: Three times a day (TID) | ORAL | Status: DC | PRN

## 2014-09-10 MED ORDER — ACETAMINOPHEN 650 MG RE SUPP: 650.0000 mg | Freq: Four times a day (QID) | RECTAL | Status: DC | PRN

## 2014-09-10 MED ORDER — METHOCARBAMOL 500 MG PO TABS
500.0000 mg | ORAL_TABLET | Freq: Four times a day (QID) | ORAL | Status: DC | PRN
  Administered 2014-09-11: 500 mg via ORAL
  Filled 2014-09-10: qty 1

## 2014-09-10 MED ORDER — CEFAZOLIN SODIUM-DEXTROSE 2-3 GM-% IV SOLR
2.0000 g | Freq: Four times a day (QID) | INTRAVENOUS | Status: DC
Start: 2014-09-11 — End: 2014-09-11
  Administered 2014-09-11 (×2): 2 g via INTRAVENOUS
  Filled 2014-09-10 (×3): qty 50

## 2014-09-10 MED ORDER — SODIUM CHLORIDE 0.9 % IV SOLN
INTRAVENOUS | Status: DC
  Administered 2014-09-11: 01:00:00 via INTRAVENOUS

## 2014-09-10 MED ORDER — DEXAMETHASONE SODIUM PHOSPHATE 10 MG/ML IJ SOLN
INTRAMUSCULAR | Status: AC
  Filled 2014-09-10: qty 1

## 2014-09-10 MED ORDER — CEFAZOLIN SODIUM-DEXTROSE 2-3 GM-% IV SOLR
2.0000 g | INTRAVENOUS | Status: AC
  Administered 2014-09-10: 2 g via INTRAVENOUS

## 2014-09-10 MED ORDER — ONDANSETRON HCL 4 MG PO TABS: 4.0000 mg | ORAL_TABLET | Freq: Four times a day (QID) | ORAL | Status: DC | PRN

## 2014-09-10 MED ORDER — ENOXAPARIN SODIUM 40 MG/0.4ML ~~LOC~~ SOLN
40.0000 mg | SUBCUTANEOUS | Status: DC
  Administered 2014-09-11: 40 mg via SUBCUTANEOUS
  Filled 2014-09-10 (×2): qty 0.4

## 2014-09-10 MED ORDER — FENTANYL CITRATE (PF) 100 MCG/2ML IJ SOLN
INTRAMUSCULAR | Status: AC
  Filled 2014-09-10: qty 4

## 2014-09-10 MED ORDER — MIDAZOLAM HCL 5 MG/5ML IJ SOLN
INTRAMUSCULAR | Status: DC | PRN
  Administered 2014-09-10: 2 mg via INTRAVENOUS

## 2014-09-10 MED ORDER — METOCLOPRAMIDE HCL 5 MG/ML IJ SOLN: 5.0000 mg | Freq: Three times a day (TID) | INTRAMUSCULAR | Status: DC | PRN

## 2014-09-10 MED ORDER — ACETAMINOPHEN 10 MG/ML IV SOLN
INTRAVENOUS | Status: AC
  Filled 2014-09-10: qty 100

## 2014-09-10 MED ORDER — ONDANSETRON HCL 4 MG/2ML IJ SOLN
INTRAMUSCULAR | Status: DC | PRN
  Administered 2014-09-10: 4 mg via INTRAVENOUS

## 2014-09-10 MED ORDER — ACETAMINOPHEN 10 MG/ML IV SOLN
1000.0000 mg | Freq: Once | INTRAVENOUS | Status: AC
  Administered 2014-09-10: 1000 mg via INTRAVENOUS

## 2014-09-10 MED ORDER — KETOROLAC TROMETHAMINE 15 MG/ML IJ SOLN: 7.5000 mg | Freq: Four times a day (QID) | INTRAMUSCULAR | Status: DC | PRN

## 2014-09-10 MED ORDER — MIDAZOLAM HCL 2 MG/2ML IJ SOLN
INTRAMUSCULAR | Status: AC
  Filled 2014-09-10: qty 4

## 2014-09-10 MED ORDER — ATOMOXETINE HCL 40 MG PO CAPS
80.0000 mg | ORAL_CAPSULE | Freq: Every morning | ORAL | Status: DC
  Administered 2014-09-11: 80 mg via ORAL
  Filled 2014-09-10: qty 2

## 2014-09-10 MED ORDER — PROPOFOL 10 MG/ML IV BOLUS
INTRAVENOUS | Status: AC
  Filled 2014-09-10: qty 20

## 2014-09-10 MED ORDER — MORPHINE SULFATE (PF) 2 MG/ML IV SOLN: 1.0000 mg | INTRAVENOUS | Status: DC | PRN

## 2014-09-10 MED ORDER — ACETAMINOPHEN 325 MG PO TABS: 650.0000 mg | ORAL_TABLET | Freq: Four times a day (QID) | ORAL | Status: DC | PRN

## 2014-09-10 MED ORDER — LACTATED RINGERS IV SOLN: INTRAVENOUS | Status: DC

## 2014-09-10 MED ORDER — FENTANYL CITRATE (PF) 100 MCG/2ML IJ SOLN: 25.0000 ug | INTRAMUSCULAR | Status: DC | PRN

## 2014-09-10 MED ORDER — OXYCODONE HCL 5 MG PO TABS
5.0000 mg | ORAL_TABLET | ORAL | Status: DC | PRN
  Administered 2014-09-10 – 2014-09-11 (×4): 10 mg via ORAL
  Filled 2014-09-10 (×4): qty 2

## 2014-09-10 MED ORDER — DEXAMETHASONE SODIUM PHOSPHATE 10 MG/ML IJ SOLN
10.0000 mg | Freq: Once | INTRAMUSCULAR | Status: AC
  Administered 2014-09-10: 10 mg via INTRAVENOUS

## 2014-09-10 MED ORDER — SIMVASTATIN 40 MG PO TABS
40.0000 mg | ORAL_TABLET | Freq: Every day | ORAL | Status: DC
  Filled 2014-09-10 (×2): qty 1

## 2014-09-10 MED ORDER — TRAMADOL HCL 50 MG PO TABS: 50.0000 mg | ORAL_TABLET | Freq: Four times a day (QID) | ORAL | Status: DC | PRN

## 2014-09-10 MED ORDER — PROPOFOL INFUSION 10 MG/ML OPTIME
INTRAVENOUS | Status: DC | PRN
  Administered 2014-09-10: 120 ug/kg/min via INTRAVENOUS

## 2014-09-10 MED ORDER — 0.9 % SODIUM CHLORIDE (POUR BTL) OPTIME
TOPICAL | Status: DC | PRN
  Administered 2014-09-10: 1000 mL

## 2014-09-10 MED ORDER — HYDROCHLOROTHIAZIDE 12.5 MG PO CAPS
12.5000 mg | ORAL_CAPSULE | Freq: Every morning | ORAL | Status: DC
  Administered 2014-09-11: 12.5 mg via ORAL
  Filled 2014-09-10: qty 1

## 2014-09-10 MED ORDER — MEPERIDINE HCL 50 MG/ML IJ SOLN: 6.2500 mg | INTRAMUSCULAR | Status: DC | PRN

## 2014-09-10 MED ORDER — METHOCARBAMOL 1000 MG/10ML IJ SOLN
500.0000 mg | Freq: Four times a day (QID) | INTRAVENOUS | Status: DC | PRN
  Filled 2014-09-10: qty 5

## 2014-09-10 MED ORDER — BUPIVACAINE HCL (PF) 0.25 % IJ SOLN
INTRAMUSCULAR | Status: DC | PRN
  Administered 2014-09-10: 30 mL

## 2014-09-10 MED ORDER — BUPIVACAINE HCL (PF) 0.75 % IJ SOLN
INTRAMUSCULAR | Status: DC | PRN
  Administered 2014-09-10: 1.2 mL

## 2014-09-10 MED ORDER — FENTANYL CITRATE (PF) 100 MCG/2ML IJ SOLN
INTRAMUSCULAR | Status: DC | PRN
  Administered 2014-09-10: 100 ug via INTRAVENOUS

## 2014-09-10 MED ORDER — PROMETHAZINE HCL 25 MG/ML IJ SOLN
6.2500 mg | INTRAMUSCULAR | Status: DC | PRN
Start: 2014-09-10 — End: 2014-09-10

## 2014-09-10 MED ORDER — ACETAMINOPHEN 500 MG PO TABS
1000.0000 mg | ORAL_TABLET | Freq: Four times a day (QID) | ORAL | Status: DC
  Administered 2014-09-10 – 2014-09-11 (×2): 1000 mg via ORAL
  Filled 2014-09-10 (×4): qty 2

## 2014-09-10 SURGICAL SUPPLY — 50 items
BAG ZIPLOCK 12X15 (MISCELLANEOUS) IMPLANT
BANDAGE ELASTIC 6 VELCRO ST LF (GAUZE/BANDAGES/DRESSINGS) ×3 IMPLANT
BANDAGE ESMARK 6X9 LF (GAUZE/BANDAGES/DRESSINGS) ×1 IMPLANT
BNDG COHESIVE 6X5 TAN STRL LF (GAUZE/BANDAGES/DRESSINGS) ×3 IMPLANT
BNDG ESMARK 6X9 LF (GAUZE/BANDAGES/DRESSINGS) ×3
BNDG GAUZE ELAST 4 BULKY (GAUZE/BANDAGES/DRESSINGS) ×3 IMPLANT
CLOSURE WOUND 1/2 X4 (GAUZE/BANDAGES/DRESSINGS) ×1
DECANTER SPIKE VIAL GLASS SM (MISCELLANEOUS) IMPLANT
DRAPE C-ARM 42X120 X-RAY (DRAPES) ×3 IMPLANT
DRSG EMULSION OIL 3X16 NADH (GAUZE/BANDAGES/DRESSINGS) ×3 IMPLANT
DRSG PAD ABDOMINAL 8X10 ST (GAUZE/BANDAGES/DRESSINGS) ×3 IMPLANT
DURAPREP 26ML APPLICATOR (WOUND CARE) ×3 IMPLANT
ELECT REM PT RETURN 9FT ADLT (ELECTROSURGICAL) ×3
ELECTRODE REM PT RTRN 9FT ADLT (ELECTROSURGICAL) ×1 IMPLANT
GAUZE SPONGE 4X4 12PLY STRL (GAUZE/BANDAGES/DRESSINGS) ×3 IMPLANT
GLOVE BIO SURGEON STRL SZ7.5 (GLOVE) IMPLANT
GLOVE BIO SURGEON STRL SZ8 (GLOVE) ×3 IMPLANT
GLOVE BIOGEL PI IND STRL 6.5 (GLOVE) ×1 IMPLANT
GLOVE BIOGEL PI IND STRL 7.5 (GLOVE) ×1 IMPLANT
GLOVE BIOGEL PI IND STRL 8 (GLOVE) ×1 IMPLANT
GLOVE BIOGEL PI INDICATOR 6.5 (GLOVE) ×2
GLOVE BIOGEL PI INDICATOR 7.5 (GLOVE) ×2
GLOVE BIOGEL PI INDICATOR 8 (GLOVE) ×2
GLOVE SURG SS PI 6.5 STRL IVOR (GLOVE) ×3 IMPLANT
GLOVE SURG SS PI 7.5 STRL IVOR (GLOVE) ×3 IMPLANT
GOWN STRL REUS W/TWL LRG LVL3 (GOWN DISPOSABLE) ×3 IMPLANT
GOWN STRL REUS W/TWL XL LVL3 (GOWN DISPOSABLE) ×3 IMPLANT
IMMOBILIZER KNEE 20 (SOFTGOODS) ×3 IMPLANT
KIT BASIN OR (CUSTOM PROCEDURE TRAY) ×3 IMPLANT
MANIFOLD NEPTUNE II (INSTRUMENTS) ×3 IMPLANT
NEEDLE HYPO 22GX1.5 SAFETY (NEEDLE) ×3 IMPLANT
PACK TOTAL JOINT (CUSTOM PROCEDURE TRAY) ×3 IMPLANT
PAD ABD 8X10 STRL (GAUZE/BANDAGES/DRESSINGS) ×3 IMPLANT
PADDING CAST COTTON 6X4 STRL (CAST SUPPLIES) ×3 IMPLANT
PASSER SUT SWANSON 36MM LOOP (INSTRUMENTS) ×3 IMPLANT
POSITIONER SURGICAL ARM (MISCELLANEOUS) ×3 IMPLANT
STRIP CLOSURE SKIN 1/2X4 (GAUZE/BANDAGES/DRESSINGS) ×2 IMPLANT
SUT ETHIBOND NAB CT1 #1 30IN (SUTURE) ×6 IMPLANT
SUT MNCRL AB 4-0 PS2 18 (SUTURE) ×3 IMPLANT
SUT VIC AB 0 CT1 27 (SUTURE) ×4
SUT VIC AB 0 CT1 27XBRD ANTBC (SUTURE) ×2 IMPLANT
SUT VIC AB 1 CT1 27 (SUTURE) ×2
SUT VIC AB 1 CT1 27XBRD ANTBC (SUTURE) ×1 IMPLANT
SUT VIC AB 2-0 CT1 27 (SUTURE) ×2
SUT VIC AB 2-0 CT1 TAPERPNT 27 (SUTURE) ×1 IMPLANT
SUT WIRE 18GA (Wire) ×3 IMPLANT
SYR 20CC LL (SYRINGE) ×3 IMPLANT
TOWEL OR 17X26 10 PK STRL BLUE (TOWEL DISPOSABLE) ×6 IMPLANT
WATER STERILE IRR 1500ML POUR (IV SOLUTION) IMPLANT
WRAP KNEE MAXI GEL POST OP (GAUZE/BANDAGES/DRESSINGS) ×3 IMPLANT

## 2014-09-10 NOTE — H&P (Signed)
  Mitchell Bell is a 54 y.o. male who presents with right knee pain.  HPI- . Knee Pain: Patient presents with knee pain involving the  right knee. Onset of the symptoms was several days ago. Inciting event: He was dancing at a wedding and his right knee gave out leading to him falling on the floor with immediate [ain and inability to get up.He had bilateral TKAs approximately a year ago and was doing great until this episode. He did not have any prodromal symptoms. He was unable to bear weight or extend his knee. He presented to the office with knee pain and effusion and had radiographs revealing a peri-prosthetic right patella fracture with displacement. He presents now for operative fixation.    Past Medical History  Diagnosis Date  . Arthritis     PAIN AND OA BOTH KNEES  . Hypercholesterolemia   . History of kidney stones   . Anxiety   . GERD (gastroesophageal reflux disease)   . Hypertension   . ADHD (attention deficit hyperactivity disorder)   . Scoliosis     BACK PAIN  . History of chicken pox   . History of mumps as a child   . Schatzki's ring     as stated per pt     Past Surgical History  Procedure Laterality Date  . Ankle surgery Left     FOR FRACTURE  . Knee arthroscopy Bilateral   . Hernia repair      LEFT INGUINAL HERNIA REPAIR - LEFT AGE 31 OR 7  . Total knee arthroplasty Bilateral 09/17/2013    Procedure: TOTAL KNEE BILATERAL;  Surgeon: Loanne Drilling, MD;  Location: [facility];  Service: Orthopedics;  Laterality: Bilateral;    Prior to Admission medications   Medication Sig Start Date End Date Taking? Authorizing Provider  aspirin 81 MG tablet Take 81 mg by mouth daily.   Yes Historical Provider, MD  atomoxetine (STRATTERA) 80 MG capsule Take 80 mg by mouth every morning.   Yes Historical Provider, MD  hydrochlorothiazide (MICROZIDE) 12.5 MG capsule Take 12.5 mg by mouth every morning.   Yes Historical Provider, MD  losartan (COZAAR) 50 MG tablet Take 50 mg by  mouth every morning.   Yes Historical Provider, MD  NON FORMULARY Take 1 capsule by mouth daily. Mega red   Yes Historical Provider, MD  omeprazole (PRILOSEC) 20 MG capsule Take 20 mg by mouth every morning.   Yes Historical Provider, MD  simvastatin (ZOCOR) 40 MG tablet Take 40 mg by mouth at bedtime.   Yes Historical Provider, MD   KNEE EXAM antalgic gait, effusion, reduced range of motion, collateral ligaments intact, palpable anterior defect and inability to extend knee  Physical Examination: General appearance - alert, well appearing, and in no distress Mental status - alert, oriented to person, place, and time Chest - clear to auscultation, no wheezes, rales or rhonchi, symmetric air entry Heart - normal rate, regular rhythm, normal S1, S2, no murmurs, rubs, clicks or gallops Abdomen - soft, nontender, nondistended, no masses or organomegaly Neurological - alert, oriented, normal speech, no focal findings or movement disorder noted    Asessment/Plan--- Right peri-prosthetic patella fracture- - Plan Open reduction and internal fixation of right peri-prosthetic patella fracture. Procedure risks and potential comps discussed with patient who elects to proceed. Goals are decreased pain and increased function with a high likelihood of achieving both

## 2014-09-10 NOTE — Brief Op Note (Signed)
09/10/2014  8:48 PM  PATIENT:  Mitchell Bell  54 y.o. male  PRE-OPERATIVE DIAGNOSIS:  RIGHT PERI PrOSTheTIC PATELLA FRACTURE  POST-OPERATIVE DIAGNOSIS:  fractured patella right peri prosthetic  PROCEDURE:  Procedure(s): OPEN REDUCTION INTERNAL (ORIF) RIGHT PERI  PATELLA FRACTURE (Right)  SURGEON:  Surgeon(s) and Role:    * Ollen Gross, MD - Primary  PHYSICIAN ASSISTANT:   ASSISTANTS: Irish Elders, PA-C   ANESTHESIA:   spinal  EBL:  Total I/O In: 1000 [I.V.:1000] Out: -   BLOOD ADMINISTERED:none  DRAINS: none   LOCAL MEDICATIONS USED:  MARCAINE     COUNTS:  YES  TOURNIQUET:  28 minutes @ 300 mm Hg  DICTATION: .Other Dictation: Dictation Number [id number]  PLAN OF CARE: Admit for overnight observation  PATIENT DISPOSITION:  PACU - hemodynamically stable.

## 2014-09-10 NOTE — Interval H&P Note (Signed)
History and Physical Interval Note:  09/10/2014 6:58 PM  Mitchell Bell  has presented today for surgery, with the diagnosis of RIGHT PERI POSTATIC PATELLA FRACTURE  The various methods of treatment have been discussed with the patient and family. After consideration of risks, benefits and other options for treatment, the patient has consented to  Procedure(s): OPEN REDUCTION INTERNAL (ORIF) RIGHT PERI  PATELLA FRACTURE (Right) as a surgical intervention .  The patient's history has been reviewed, patient examined, no change in status, stable for surgery.  I have reviewed the patient's chart and labs.  Questions were answered to the patient's satisfaction.     Loanne Drilling

## 2014-09-10 NOTE — Anesthesia Preprocedure Evaluation (Addendum)
Anesthesia Evaluation  Patient identified by MRN, date of birth, ID band Patient awake    Reviewed: Allergy & Precautions, NPO status , Patient's Chart, lab work & pertinent test results  Airway Mallampati: II  TM Distance: >3 FB Neck ROM: Full    Dental no notable dental hx.    Pulmonary neg pulmonary ROS,  breath sounds clear to auscultation  Pulmonary exam normal       Cardiovascular hypertension, Pt. on medications negative cardio ROS Normal cardiovascular examRhythm:Regular Rate:Normal     Neuro/Psych negative neurological ROS  negative psych ROS   GI/Hepatic negative GI ROS, Neg liver ROS,   Endo/Other  negative endocrine ROS  Renal/GU negative Renal ROS  negative genitourinary   Musculoskeletal negative musculoskeletal ROS (+)   Abdominal   Peds negative pediatric ROS (+)  Hematology negative hematology ROS (+)   Anesthesia Other Findings   Reproductive/Obstetrics negative OB ROS                             Anesthesia Physical Anesthesia Plan  ASA: II  Anesthesia Plan: Spinal   Post-op Pain Management:    Induction:   Airway Management Planned: Simple Face Mask  Additional Equipment:   Intra-op Plan:   Post-operative Plan:   Informed Consent: I have reviewed the patients History and Physical, chart, labs and discussed the procedure including the risks, benefits and alternatives for the proposed anesthesia with the patient or authorized representative who has indicated his/her understanding and acceptance.   Dental advisory given  Plan Discussed with: CRNA  Anesthesia Plan Comments:         Anesthesia Quick Evaluation  

## 2014-09-10 NOTE — Anesthesia Procedure Notes (Signed)
Spinal Patient location during procedure: OR Staffing Anesthesiologist: Montez Hageman Performed by: anesthesiologist  Preanesthetic Checklist Completed: patient identified, site marked, surgical consent, pre-op evaluation, timeout performed, IV checked, risks and benefits discussed and monitors and equipment checked Spinal Block Patient position: right lateral decubitus Prep: Betadine Patient monitoring: heart rate, continuous pulse ox and blood pressure Approach: left paramedian Location: L3-4 Injection technique: single-shot Needle Needle type: Sprotte  Needle gauge: 24 G Needle length: 9 cm Additional Notes Expiration date of kit checked and confirmed. Patient tolerated procedure well, without complications.

## 2014-09-10 NOTE — Transfer of Care (Signed)
Immediate Anesthesia Transfer of Care Note  Patient: Mitchell Bell  Procedure(s) Performed: Procedure(s): OPEN REDUCTION INTERNAL (ORIF) RIGHT PERI  PATELLA FRACTURE (Right)  Patient Location: PACU  Anesthesia Type:Spinal  Level of Consciousness: awake, alert , oriented and patient cooperative  Airway & Oxygen Therapy: Patient Spontanous Breathing and Patient connected to face mask oxygen  Post-op Assessment: Report given to RN and Post -op Vital signs reviewed and stable  Post vital signs: stable  Last Vitals:  Filed Vitals:   09/10/14 1532  BP: 128/76  Pulse: 73  Temp: 36.6 C  Resp: 16    Complications: No apparent anesthesia complications   11 spinal level

## 2014-09-10 NOTE — Anesthesia Postprocedure Evaluation (Signed)
  Anesthesia Post-op Note  Patient: Mitchell Bell  Procedure(s) Performed: Procedure(s) (LRB): OPEN REDUCTION INTERNAL (ORIF) RIGHT PERI  PATELLA FRACTURE (Right)  Patient Location: PACU  Anesthesia Type: Spinal  Level of Consciousness: awake and alert   Airway and Oxygen Therapy: Patient Spontanous Breathing  Post-op Pain: mild  Post-op Assessment: Post-op Vital signs reviewed, Patient's Cardiovascular Status Stable, Respiratory Function Stable, Patent Airway and No signs of Nausea or vomiting  Last Vitals:  Filed Vitals:   09/10/14 2115  BP: 129/43  Pulse: 74  Temp: 36.6 C  Resp: 16    Post-op Vital Signs: stable   Complications: No apparent anesthesia complications

## 2014-09-11 ENCOUNTER — Encounter (HOSPITAL_COMMUNITY): Payer: Self-pay | Admitting: Orthopedic Surgery

## 2014-09-11 DIAGNOSIS — T84042A Periprosthetic fracture around internal prosthetic right knee joint, initial encounter: Secondary | ICD-10-CM | POA: Diagnosis not present

## 2014-09-11 MED ORDER — TRAMADOL HCL 50 MG PO TABS: 50.0000 mg | ORAL_TABLET | Freq: Four times a day (QID) | ORAL | Status: DC | PRN

## 2014-09-11 MED ORDER — METHOCARBAMOL 500 MG PO TABS: 500.0000 mg | ORAL_TABLET | Freq: Four times a day (QID) | ORAL | Status: DC | PRN

## 2014-09-11 MED ORDER — OXYCODONE HCL 5 MG PO TABS: 5.0000 mg | ORAL_TABLET | ORAL | Status: DC | PRN

## 2014-09-11 NOTE — Progress Notes (Signed)
   Subjective: 1 Day Post-Op Procedure(s) (LRB): OPEN REDUCTION INTERNAL (ORIF) RIGHT PERI  PATELLA FRACTURE (Right) Patient reports pain as mild.   Patient seen in rounds with Dr. Lequita Halt. Patient is well, and has had no acute complaints or problems. No issues overnight. Plan is to go Home after hospital stay.  Objective: Vital signs in last 24 hours: Temp:  [97 F (36.1 C)-98.5 F (36.9 C)] 97.9 F (36.6 C) (08/19 0616) Pulse Rate:  [63-79] 76 (08/19 0616) Resp:  [12-16] 16 (08/19 0616) BP: (113-136)/(43-79) 136/74 mmHg (08/19 0616) SpO2:  [98 %-100 %] 100 % (08/19 0616) Weight:  [104.327 kg (230 lb)] 104.327 kg (230 lb) (08/18 1532)  Intake/Output from previous day:  Intake/Output Summary (Last 24 hours) at 09/11/14 0746 Last data filed at 09/11/14 0616  Gross per 24 hour  Intake   3850 ml  Output   1900 ml  Net   1950 ml     Labs:  Recent Labs  09/09/14 1045  HGB 14.7    Recent Labs  09/09/14 1045  WBC 5.6  RBC 4.71  HCT 42.7  PLT 191    Recent Labs  09/09/14 1045  NA 140  K 4.0  CL 104  CO2 30  BUN 14  CREATININE 0.87  GLUCOSE 100*  CALCIUM 9.7    EXAM General - Patient is Alert and Oriented Extremity - Neurologically intact Intact pulses distally Dorsiflexion/Plantar flexion intact Compartment soft Dressing/Incision - clean, dry, no drainage Motor Function - intact, moving foot and toes well on exam.   Past Medical History  Diagnosis Date  . Arthritis     PAIN AND OA BOTH KNEES  . Hypercholesterolemia   . History of kidney stones   . Anxiety   . GERD (gastroesophageal reflux disease)   . Hypertension   . ADHD (attention deficit hyperactivity disorder)   . Scoliosis     BACK PAIN  . History of chicken pox   . History of mumps as a child   . Schatzki's ring     as stated per pt     Assessment/Plan: 1 Day Post-Op Procedure(s) (LRB): OPEN REDUCTION INTERNAL (ORIF) RIGHT PERI  PATELLA FRACTURE (Right) Principal Problem:  Peri-prosthetic patellar fracture Active Problems:   Patella fracture  Estimated body mass index is 33 kg/(m^2) as calculated from the following:   Height as of this encounter:  (1.778 m).   Weight as of this encounter: 104.327 kg (230 lb). Advance diet Up with therapy D/C IV fluids Discharge home with home health  DVT Prophylaxis - Aspirin Weight-Bearing as tolerated  Maintain knee immobilizer    Dimitri Ped, PA-C Orthopaedic Surgery 09/11/2014, 7:46 AM

## 2014-09-11 NOTE — Discharge Instructions (Signed)
Change your dressing daily. Shower only, no tub bath. Call if any temperatures greater than 101 or any wound complications: [phone]  Take aspirin  daily for 2 weeks then start aspirin  daily for 4 weeks Continue use of knee immobilizer

## 2014-09-11 NOTE — Op Note (Signed)
NAMEEMILIANO, WELSHANS NO.:  [id number]  MEDICAL RECORD NO.:  [id number]  LOCATION:  1603                         FACILITY:  [facility]  PHYSICIAN:  Ollen Gross, M.D.    DATE OF BIRTH:  1960-12-01  DATE OF PROCEDURE:  09/10/2014 DATE OF DISCHARGE:                              OPERATIVE REPORT   PREOPERATIVE DIAGNOSIS:  Right periprosthetic patella fracture.  POSTOPERATIVE DIAGNOSIS:  Right periprosthetic patella fracture.  PROCEDURE:  Open reduction and internal fixation, right periprosthetic patella fracture.  SURGEON:  Ollen Gross, M.D.  ASSISTANT:  Andrez Grime, PA-C.  ANESTHESIA:  Spinal.  ESTIMATED BLOOD LOSS:  Minimal.  DRAINS:  None.  TOURNIQUET TIME:  28 minutes at 300 mmHg.  COMPLICATIONS:  None.  CONDITION:  Stable to recovery.  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  He was doing great, but then this past weekend was dancing at a wedding and his right knee gave out.  He hit the ground and noticed pain and deformity in the knee.  He sustained a displaced right periprosthetic patella fracture. He presents now for open reduction and internal fixation.  PROCEDURE IN DETAIL:  After successful administration of spinal anesthetic, a tourniquet was placed high on his right thigh and right lower extremity.  It  was prepped and draped in usual sterile fashion. Extremities wrapped in Esmarch, and tourniquet inflated to 300 mmHg.  A midline incision was made with a 10 blade through subcutaneous tissue to the extensor mechanism.  He had a tear in the medial and lateral retinaculum transverse in nature, in line with where the fracture was. He had one large superior fragment and one smaller superior fragment coming off the larger inferior pole of the patella.  The prosthetic patella was attached to the inferior fragment, it was well fixed.  The larger fragment  anatomically reduced, the smaller fragment was a little more comminuted, but I was able to remove some of the tiny pieces, but yet maintain the larger of the pieces.  I held to reduced with a towel clip and then we passed 0.062 K-wires from inferior to superior through the patella and then through the fracture fragments.  It is effectively reduced them.  We then placed 18-gauge wire in a cerclage fashion and tightened that down further reducing the fracture.  On the AP and lateral views, we had excellent alignment of this.  Other than the small amount of comminution, the reduction was near anatomic.  I was able to flex the knee at 90 degrees and everything stayed intact.  We then thoroughly irrigated the joint further through the tears in the retinaculum.  We then closed those tears with interrupted #1 Ethibond suture.  The flexion against gravity was even easier and 90 degrees was easily achieved.  The tourniquet was then released with total time of 28 minutes.  30 mL of 0.25% Marcaine was injected in the subcutaneous tissues.  Subcu was closed with interrupted 2-0 Vicryl and subcuticular running 4-0 Monocryl.  The incision was cleaned and dried, and Steri- Strips and a bulky sterile dressing applied.  He was  placed into a knee immobilizer, awakened, and transferred to recovery in stable condition.  Note that the surgical assistant was a medical necessity for this procedure to do it in a safe and expeditious manner.  Surgical assistance necessary for retraction of tissues and for maintenance of reduction of the fracture while the hardware was being placed.     Ollen Gross, M.D.     FA/MEDQ  D:  09/10/2014  T:  09/11/2014  Job:  [id number]

## 2014-09-11 NOTE — Evaluation (Signed)
Physical Therapy Evaluation Patient Details Name: Mitchell Bell MRN: [id number] DOB: 1960/05/31 Today's Date: 09/11/2014   History of Present Illness  fx R patella, orif , previous Bil tka  Clinical Impression  Patient is progressing well. Ready for DC. Declined need to practice steps.    Follow Up Recommendations No PT follow up    Equipment Recommendations  None recommended by PT    Recommendations for Other Services       Precautions / Restrictions Precautions Precautions: Fall Restrictions Weight Bearing Restrictions: No RLE Weight Bearing: Weight bearing as tolerated      Mobility  Bed Mobility Overal bed mobility: Modified Independent                Transfers Overall transfer level: Modified independent                  Ambulation/Gait Ambulation/Gait assistance: Modified independent (Device/Increase time) Ambulation Distance (Feet): 200 Feet Assistive device: Rolling walker (2 wheeled) Gait Pattern/deviations: Step-to pattern;Step-through pattern;Antalgic        Stairs Stairs: Yes       General stair comments: declined need to practice  Wheelchair Mobility    Modified Rankin (Stroke Patients Only)       Balance                                             Pertinent Vitals/Pain Pain Assessment: 0-10 Pain Score: 4  Pain Location: R knee Pain Descriptors / Indicators: Aching;Burning Pain Intervention(s): Monitored during session;Premedicated before session;Ice applied    Home Living Family/patient expects to be discharged to:: Private residence Living Arrangements: Spouse/significant other Available Help at Discharge: Family Type of Home: House Home Access: Stairs to enter Entrance Stairs-Rails: Right;Left;Can reach both Entrance Stairs-Number of Steps: 3 Home Layout: One level Home Equipment: Walker - 2 wheels;Crutches;Shower seat;Bedside commode      Prior Function Level of Independence:  Independent               Hand Dominance        Extremity/Trunk Assessment   Upper Extremity Assessment: Overall WFL for tasks assessed           Lower Extremity Assessment: RLE deficits/detail RLE Deficits / Details: in KI       Communication   Communication: No difficulties  Cognition Arousal/Alertness: Awake/alert Behavior During Therapy: WFL for tasks assessed/performed Overall Cognitive Status: Within Functional Limits for tasks assessed                      General Comments      Exercises        Assessment/Plan    PT Assessment Patent does not need any further PT services  PT Diagnosis Acute pain;Difficulty walking   PT Problem List    PT Treatment Interventions     PT Goals (Current goals can be found in the Care Plan section) Acute Rehab PT Goals PT Goal Formulation: All assessment and education complete, DC therapy    Frequency     Barriers to discharge        Co-evaluation               End of Session Equipment Utilized During Treatment: Right knee immobilizer Activity Tolerance: Patient tolerated treatment well Patient left: in bed;with call bell/phone within reach;with nursing/sitter in room Nurse Communication: Mobility status  Functional Assessment Tool Used: clinical judgement Functional Limitation: Mobility: Walking and moving around Mobility: Walking and Moving Around Current Status [phone]): At least 1 percent but less than 20 percent impaired, limited or restricted Mobility: Walking and Moving Around Goal Status [phone]): At least 1 percent but less than 20 percent impaired, limited or restricted Mobility: Walking and Moving Around Discharge Status [phone]): At least 1 percent but less than 20 percent impaired, limited or restricted    Time: 0905-0931 PT Time Calculation (min) (ACUTE ONLY): 26 min   Charges:   PT Evaluation $Initial PT Evaluation Tier I: 1 Procedure PT Treatments $Gait Training: 8-22  mins   PT G Codes:   PT G-Codes **NOT FOR INPATIENT CLASS** Functional Assessment Tool Used: clinical judgement Functional Limitation: Mobility: Walking and moving around Mobility: Walking and Moving Around Current Status ([id number]): At least 1 percent but less than 20 percent impaired, limited or restricted Mobility: Walking and Moving Around Goal Status [phone]): At least 1 percent but less than 20 percent impaired, limited or restricted Mobility: Walking and Moving Around Discharge Status [phone]): At least 1 percent but less than 20 percent impaired, limited or restricted    Rada Hay 09/11/2014, 11:12 AM Blanchard Kelch PT [phone]

## 2014-09-13 NOTE — Discharge Summary (Signed)
Physician Discharge Summary   Patient ID: Mitchell Bell MRN: [id number] DOB/AGE: Oct 25, 1960 54 y.o.  Admit date: 09/10/2014 Discharge date: 09/11/2014  Primary Diagnosis: Periprosthetic patella fracture, right knee   Admission Diagnoses:  Past Medical History  Diagnosis Date  . Arthritis     PAIN AND OA BOTH KNEES  . Hypercholesterolemia   . History of kidney stones   . Anxiety   . GERD (gastroesophageal reflux disease)   . Hypertension   . ADHD (attention deficit hyperactivity disorder)   . Scoliosis     BACK PAIN  . History of chicken pox   . History of mumps as a child   . Schatzki's ring     as stated per pt    Discharge Diagnoses:   Principal Problem:   Peri-prosthetic patellar fracture Active Problems:   Patella fracture  Estimated body mass index is 33 kg/(m^2) as calculated from the following:   Height as of this encounter: 5' 10"  (1.778 m).   Weight as of this encounter: 104.327 kg (230 lb).  Procedure:  Procedure(s) (LRB): OPEN REDUCTION INTERNAL (ORIF) RIGHT PERI  PATELLA FRACTURE (Right)   Consults: None  HPI: Patient presents with knee pain involving the right knee. Onset of the symptoms was several days ago. Inciting event: He was dancing at a wedding and his right knee gave out leading to him falling on the floor with immediate [ain and inability to get up.He had bilateral TKAs approximately a year ago and was doing great until this episode. He did not have any prodromal symptoms. He was unable to bear weight or extend his knee. He presented to the office with knee pain and effusion and had radiographs revealing a peri-prosthetic right patella fracture with displacement. He presents now for operative fixation.   Laboratory Data: Hospital Outpatient Visit on 09/09/2014  Component Date Value Ref Range Status  . WBC 09/09/2014 5.6  4.0 - 10.5 K/uL Final  . RBC 09/09/2014 4.71  4.22 - 5.81 MIL/uL Final  . Hemoglobin 09/09/2014 14.7  13.0 - 17.0 g/dL  Final  . HCT 09/09/2014 42.7  39.0 - 52.0 % Final  . MCV 09/09/2014 90.7  78.0 - 100.0 fL Final  . MCH 09/09/2014 31.2  26.0 - 34.0 pg Final  . MCHC 09/09/2014 34.4  30.0 - 36.0 g/dL Final  . RDW 09/09/2014 12.1  11.5 - 15.5 % Final  . Platelets 09/09/2014 191  150 - 400 K/uL Final  . Sodium 09/09/2014 140  135 - 145 mmol/L Final  . Potassium 09/09/2014 4.0  3.5 - 5.1 mmol/L Final  . Chloride 09/09/2014 104  101 - 111 mmol/L Final  . CO2 09/09/2014 30  22 - 32 mmol/L Final  . Glucose, Bld 09/09/2014 100* 65 - 99 mg/dL Final  . BUN 09/09/2014 14  6 - 20 mg/dL Final  . Creatinine, Ser 09/09/2014 0.87  0.61 - 1.24 mg/dL Final  . Calcium 09/09/2014 9.7  8.9 - 10.3 mg/dL Final  . GFR calc non Af Amer 09/09/2014 >60  >60 mL/min Final  . GFR calc Af Amer 09/09/2014 >60  >60 mL/min Final   Comment: (NOTE) The eGFR has been calculated using the CKD EPI equation. This calculation has not been validated in all clinical situations. eGFR's persistently <60 mL/min signify possible Chronic Kidney Disease.   . Anion gap 09/09/2014 6  5 - 15 Final     X-Rays:Dg Knee 1-2 Views Right  09/10/2014   CLINICAL DATA:  ORIF of right patellar  fracture.  EXAM: RIGHT KNEE - 1-2 VIEW  COMPARISON:  None.  FINDINGS: Two fluoroscopic images were obtained of the right knee. There is a total knee arthroplasty. There is a cerclage wire and 2 surgical pins involving the patella.  IMPRESSION: Internal fixation of right patella fracture.   Electronically Signed   By: Markus Daft M.D.   On: 09/10/2014 20:58   Dg C-arm 1-60 Min-no Report  09/10/2014   CLINICAL DATA: fractured patella   C-ARM 1-60 MINUTES  Fluoroscopy was utilized by the requesting physician.  No radiographic  interpretation.     EKG: Orders placed or performed during the hospital encounter of 09/09/14  . EKG 12-Lead  . EKG 12-Lead     Hospital Course: Mitchell Bell is a 54 y.o. who was admitted to [facility]. They were brought to the  operating room on 09/10/2014 and underwent Procedure(s): OPEN REDUCTION INTERNAL (ORIF) RIGHT PERI  PATELLA FRACTURE.  Patient tolerated the procedure well and was later transferred to the recovery room and then to the orthopaedic floor for postoperative care.  They were given PO and IV analgesics for pain control following their surgery.  They were given 24 hours of postoperative antibiotics of  Anti-infectives    Start     Dose/Rate Route Frequency Ordered Stop   09/11/14 0600  ceFAZolin (ANCEF) IVPB 2 g/50 mL premix     2 g 100 mL/hr over 30 Minutes Intravenous On call to O.R. 09/10/14 1547 09/10/14 1951   09/11/14 0200  ceFAZolin (ANCEF) IVPB 2 g/50 mL premix  Status:  Discontinued     2 g 100 mL/hr over 30 Minutes Intravenous Every 6 hours 09/10/14 2217 09/11/14 1407     and started on DVT prophylaxis in the form of Lovenox.   PT and OT were ordered. Discharge planning consulted to help with postop disposition and equipment needs.  Patient had a good night on the evening of surgery.  They started to get up OOB with therapy on day one. Patient was seen in rounds and was ready to go home.   Diet: Cardiac diet Activity:WBAT Follow-up:in 2 weeks Disposition - Home Discharged Condition: stable   Discharge Instructions    Call MD / Call 911    Complete by:  As directed   If you experience chest pain or shortness of breath, CALL 911 and be transported to the hospital emergency room.  If you develope a fever above 101 F, pus (white drainage) or increased drainage or redness at the wound, or calf pain, call your surgeon's office.     Constipation Prevention    Complete by:  As directed   Drink plenty of fluids.  Prune juice may be helpful.  You may use a stool softener, such as Colace (over the counter) 100 mg twice a day.  Use MiraLax (over the counter) for constipation as needed.     Diet - low sodium heart healthy    Complete by:  As directed      Discharge instructions    Complete by:   As directed   Change your dressing daily. Shower only, no tub bath. Call if any temperatures greater than 101 or any wound complications: [phone]  Take aspirin 38m daily for 2 weeks then start aspirin 867mdaily for 4 weeks Continue use of knee immobilizer     Increase activity slowly as tolerated    Complete by:  As directed  Medication List    STOP taking these medications        aspirin 81 MG tablet      TAKE these medications        atomoxetine 80 MG capsule  Commonly known as:  STRATTERA  Take 80 mg by mouth every morning.     hydrochlorothiazide 12.5 MG capsule  Commonly known as:  MICROZIDE  Take 12.5 mg by mouth every morning.     losartan 50 MG tablet  Commonly known as:  COZAAR  Take 50 mg by mouth every morning.     methocarbamol 500 MG tablet  Commonly known as:  ROBAXIN  Take 1 tablet (500 mg total) by mouth every 6 (six) hours as needed for muscle spasms.     NON FORMULARY  Take 1 capsule by mouth daily. Mega red     omeprazole 20 MG capsule  Commonly known as:  PRILOSEC  Take 20 mg by mouth every morning.     oxyCODONE 5 MG immediate release tablet  Commonly known as:  Oxy IR/ROXICODONE  Take 1-2 tablets (5-10 mg total) by mouth every 3 (three) hours as needed for breakthrough pain.     simvastatin 40 MG tablet  Commonly known as:  ZOCOR  Take 40 mg by mouth at bedtime.     traMADol 50 MG tablet  Commonly known as:  ULTRAM  Take 1-2 tablets (50-100 mg total) by mouth every 6 (six) hours as needed for moderate pain.           Follow-up Information    Follow up with Gearlean Alf, MD. Schedule an appointment as soon as possible for a visit on 09/24/2014.   Specialty:  Orthopedic Surgery   Contact information:   [address] [place] [address] [phone]       Signed: Ardeen Jourdain, PA-C Orthopaedic Surgery 09/13/2014, 12:31 PM

## 2015-03-30 ENCOUNTER — Ambulatory Visit: Payer: Self-pay | Admitting: Orthopedic Surgery

## 2015-03-30 NOTE — Progress Notes (Signed)
Preoperative surgical orders have been place into the Epic hospital system for Mitchell Bell on 03/30/2015, 10:56 AM  by Patrica DuelPERKINS, Halim Surrette for surgery on 04-07-15.  Preop Knee orders including IV Tylenol and IV Decadron as long as there are no contraindications to the above medications. Mitchell Peacerew Kajuan Guyton, PA-C

## 2015-04-01 ENCOUNTER — Encounter (HOSPITAL_BASED_OUTPATIENT_CLINIC_OR_DEPARTMENT_OTHER): Payer: Self-pay | Admitting: *Deleted

## 2015-04-01 NOTE — Progress Notes (Signed)
NPO AFTER MN.   ARRIVE AT 0830.  NEEDS ISTAT.  CURRENT EKG IN CHART AND EPIC.  WILL TAKE COZAAR AND PRILOSEC AM DOS W/ SIPS OF  WATER.

## 2015-04-07 ENCOUNTER — Ambulatory Visit (HOSPITAL_BASED_OUTPATIENT_CLINIC_OR_DEPARTMENT_OTHER): Payer: BLUE CROSS/BLUE SHIELD | Admitting: Anesthesiology

## 2015-04-07 ENCOUNTER — Encounter (HOSPITAL_BASED_OUTPATIENT_CLINIC_OR_DEPARTMENT_OTHER): Payer: Self-pay | Admitting: *Deleted

## 2015-04-07 ENCOUNTER — Encounter (HOSPITAL_BASED_OUTPATIENT_CLINIC_OR_DEPARTMENT_OTHER)
Admission: RE | Disposition: A | Discharge status: Home / Self Care | Payer: Self-pay | Source: Ambulatory Visit | Attending: Orthopedic Surgery

## 2015-04-07 ENCOUNTER — Ambulatory Visit (HOSPITAL_BASED_OUTPATIENT_CLINIC_OR_DEPARTMENT_OTHER)
Admission: RE | Admit: 2015-04-07 | Discharge: 2015-04-07 | Disposition: A | Discharge status: Home / Self Care | Payer: BLUE CROSS/BLUE SHIELD | Source: Ambulatory Visit | Attending: Orthopedic Surgery | Admitting: Orthopedic Surgery

## 2015-04-07 DIAGNOSIS — I10 Essential (primary) hypertension: Secondary | ICD-10-CM | POA: Insufficient documentation

## 2015-04-07 DIAGNOSIS — K219 Gastro-esophageal reflux disease without esophagitis: Secondary | ICD-10-CM | POA: Insufficient documentation

## 2015-04-07 DIAGNOSIS — F909 Attention-deficit hyperactivity disorder, unspecified type: Secondary | ICD-10-CM | POA: Diagnosis not present

## 2015-04-07 DIAGNOSIS — E669 Obesity, unspecified: Secondary | ICD-10-CM | POA: Diagnosis not present

## 2015-04-07 DIAGNOSIS — Z79899 Other long term (current) drug therapy: Secondary | ICD-10-CM | POA: Insufficient documentation

## 2015-04-07 DIAGNOSIS — Z6832 Body mass index (BMI) 32.0-32.9, adult: Secondary | ICD-10-CM | POA: Diagnosis not present

## 2015-04-07 DIAGNOSIS — T8484XA Pain due to internal orthopedic prosthetic devices, implants and grafts, initial encounter: Secondary | ICD-10-CM | POA: Diagnosis present

## 2015-04-07 DIAGNOSIS — Y831 Surgical operation with implant of artificial internal device as the cause of abnormal reaction of the patient, or of later complication, without mention of misadventure at the time of the procedure: Secondary | ICD-10-CM | POA: Diagnosis not present

## 2015-04-07 DIAGNOSIS — Z96653 Presence of artificial knee joint, bilateral: Secondary | ICD-10-CM | POA: Insufficient documentation

## 2015-04-07 DIAGNOSIS — T84196A Other mechanical complication of internal fixation device of bone of right lower leg, initial encounter: Secondary | ICD-10-CM | POA: Insufficient documentation

## 2015-04-07 HISTORY — DX: Pain in right knee: M25.561

## 2015-04-07 HISTORY — PX: HARDWARE REMOVAL: SHX979

## 2015-04-07 HISTORY — DX: Other specified personal risk factors, not elsewhere classified: Z91.89

## 2015-04-07 LAB — POCT I-STAT, CHEM 8
BUN: 17 mg/dL (ref 6–20)
CREATININE: 0.7 mg/dL (ref 0.61–1.24)
Calcium, Ion: 1.24 mmol/L — ABNORMAL HIGH (ref 1.12–1.23)
Chloride: 104 mmol/L (ref 101–111)
GLUCOSE: 103 mg/dL — AB (ref 65–99)
HEMATOCRIT: 49 % (ref 39.0–52.0)
HEMOGLOBIN: 16.7 g/dL (ref 13.0–17.0)
Potassium: 3.8 mmol/L (ref 3.5–5.1)
Sodium: 142 mmol/L (ref 135–145)
TCO2: 23 mmol/L (ref 0–100)

## 2015-04-07 SURGERY — REMOVAL, HARDWARE
Anesthesia: General | Site: Knee | Laterality: Right

## 2015-04-07 MED ORDER — FENTANYL CITRATE (PF) 100 MCG/2ML IJ SOLN
INTRAMUSCULAR | Status: DC | PRN
  Administered 2015-04-07 (×2): 50 ug via INTRAVENOUS

## 2015-04-07 MED ORDER — METHOCARBAMOL 500 MG PO TABS: 500.0000 mg | ORAL_TABLET | Freq: Four times a day (QID) | ORAL | Status: DC

## 2015-04-07 MED ORDER — OXYCODONE HCL 5 MG PO TABS
5.0000 mg | ORAL_TABLET | Freq: Once | ORAL | Status: AC | PRN
  Administered 2015-04-07: 5 mg via ORAL
  Filled 2015-04-07: qty 1

## 2015-04-07 MED ORDER — LIDOCAINE HCL (CARDIAC) 20 MG/ML IV SOLN
INTRAVENOUS | Status: AC
  Filled 2015-04-07: qty 5

## 2015-04-07 MED ORDER — OXYCODONE HCL 5 MG PO TABS
ORAL_TABLET | ORAL | Status: AC
  Filled 2015-04-07: qty 1

## 2015-04-07 MED ORDER — DEXAMETHASONE SODIUM PHOSPHATE 4 MG/ML IJ SOLN
INTRAMUSCULAR | Status: DC | PRN
  Administered 2015-04-07: 10 mg via INTRAVENOUS

## 2015-04-07 MED ORDER — KETOROLAC TROMETHAMINE 30 MG/ML IJ SOLN
INTRAMUSCULAR | Status: DC | PRN
  Administered 2015-04-07: 30 mg via INTRAVENOUS

## 2015-04-07 MED ORDER — SODIUM CHLORIDE 0.9 % IR SOLN
Status: DC | PRN
  Administered 2015-04-07: 500 mL

## 2015-04-07 MED ORDER — MIDAZOLAM HCL 5 MG/5ML IJ SOLN
INTRAMUSCULAR | Status: DC | PRN
  Administered 2015-04-07: 2 mg via INTRAVENOUS

## 2015-04-07 MED ORDER — ACETAMINOPHEN 10 MG/ML IV SOLN
INTRAVENOUS | Status: AC
  Filled 2015-04-07: qty 100

## 2015-04-07 MED ORDER — MIDAZOLAM HCL 2 MG/2ML IJ SOLN
INTRAMUSCULAR | Status: AC
  Filled 2015-04-07: qty 2

## 2015-04-07 MED ORDER — FENTANYL CITRATE (PF) 100 MCG/2ML IJ SOLN
INTRAMUSCULAR | Status: AC
  Filled 2015-04-07: qty 2

## 2015-04-07 MED ORDER — ONDANSETRON HCL 4 MG/2ML IJ SOLN
INTRAMUSCULAR | Status: DC | PRN
  Administered 2015-04-07: 4 mg via INTRAVENOUS

## 2015-04-07 MED ORDER — DEXAMETHASONE SODIUM PHOSPHATE 10 MG/ML IJ SOLN
10.0000 mg | Freq: Once | INTRAMUSCULAR | Status: DC
  Filled 2015-04-07: qty 1

## 2015-04-07 MED ORDER — BUPIVACAINE-EPINEPHRINE 0.25% -1:200000 IJ SOLN
INTRAMUSCULAR | Status: DC | PRN
  Administered 2015-04-07: 20 mL

## 2015-04-07 MED ORDER — DEXAMETHASONE SODIUM PHOSPHATE 10 MG/ML IJ SOLN
INTRAMUSCULAR | Status: AC
  Filled 2015-04-07: qty 1

## 2015-04-07 MED ORDER — CEFAZOLIN SODIUM-DEXTROSE 2-3 GM-% IV SOLR
2.0000 g | INTRAVENOUS | Status: AC
  Administered 2015-04-07: 2 g via INTRAVENOUS
  Filled 2015-04-07: qty 50

## 2015-04-07 MED ORDER — LACTATED RINGERS IV SOLN
INTRAVENOUS | Status: DC
  Administered 2015-04-07: 09:00:00 via INTRAVENOUS
  Filled 2015-04-07: qty 1000

## 2015-04-07 MED ORDER — PROPOFOL 10 MG/ML IV BOLUS
INTRAVENOUS | Status: DC | PRN
  Administered 2015-04-07: 200 mg via INTRAVENOUS

## 2015-04-07 MED ORDER — PROPOFOL 10 MG/ML IV BOLUS
INTRAVENOUS | Status: AC
  Filled 2015-04-07: qty 40

## 2015-04-07 MED ORDER — HYDROMORPHONE HCL 1 MG/ML IJ SOLN
0.2500 mg | INTRAMUSCULAR | Status: DC | PRN
  Filled 2015-04-07: qty 1

## 2015-04-07 MED ORDER — CEFAZOLIN SODIUM-DEXTROSE 2-3 GM-% IV SOLR
INTRAVENOUS | Status: AC
  Filled 2015-04-07: qty 50

## 2015-04-07 MED ORDER — ONDANSETRON HCL 4 MG/2ML IJ SOLN
INTRAMUSCULAR | Status: AC
  Filled 2015-04-07: qty 2

## 2015-04-07 MED ORDER — LIDOCAINE HCL (CARDIAC) 20 MG/ML IV SOLN
INTRAVENOUS | Status: DC | PRN
  Administered 2015-04-07: 100 mg via INTRAVENOUS

## 2015-04-07 MED ORDER — SODIUM CHLORIDE 0.9 % IV SOLN
INTRAVENOUS | Status: DC
  Filled 2015-04-07: qty 1000

## 2015-04-07 MED ORDER — MEPERIDINE HCL 25 MG/ML IJ SOLN
6.2500 mg | INTRAMUSCULAR | Status: DC | PRN
  Filled 2015-04-07: qty 1

## 2015-04-07 MED ORDER — CHLORHEXIDINE GLUCONATE 4 % EX LIQD
60.0000 mL | Freq: Once | CUTANEOUS | Status: DC
  Filled 2015-04-07: qty 60

## 2015-04-07 MED ORDER — OXYCODONE HCL 5 MG/5ML PO SOLN
5.0000 mg | Freq: Once | ORAL | Status: AC | PRN
  Filled 2015-04-07: qty 5

## 2015-04-07 MED ORDER — HYDROCODONE-ACETAMINOPHEN 5-325 MG PO TABS: 1.0000 | ORAL_TABLET | ORAL | Status: DC | PRN

## 2015-04-07 MED ORDER — KETOROLAC TROMETHAMINE 30 MG/ML IJ SOLN
INTRAMUSCULAR | Status: AC
  Filled 2015-04-07: qty 1

## 2015-04-07 MED ORDER — ACETAMINOPHEN 10 MG/ML IV SOLN
1000.0000 mg | Freq: Once | INTRAVENOUS | Status: AC
  Administered 2015-04-07: 1000 mg via INTRAVENOUS
  Filled 2015-04-07: qty 100

## 2015-04-07 SURGICAL SUPPLY — 49 items
BANDAGE ELASTIC 6 VELCRO NS (GAUZE/BANDAGES/DRESSINGS) ×3 IMPLANT
BANDAGE ELASTIC 6 VELCRO ST LF (GAUZE/BANDAGES/DRESSINGS) ×3 IMPLANT
BANDAGE ESMARK 6X9 LF (GAUZE/BANDAGES/DRESSINGS) ×1 IMPLANT
BLADE SURG 15 STRL LF DISP TIS (BLADE) ×1 IMPLANT
BLADE SURG 15 STRL SS (BLADE) ×2
BNDG ESMARK 6X9 LF (GAUZE/BANDAGES/DRESSINGS) ×3
CANISTER SUCTION 2500CC (MISCELLANEOUS) IMPLANT
CLOSURE WOUND 1/2 X4 (GAUZE/BANDAGES/DRESSINGS) ×1
CLOTH BEACON ORANGE TIMEOUT ST (SAFETY) ×3 IMPLANT
COVER BACK TABLE 60X90IN (DRAPES) ×3 IMPLANT
COVER MAYO STAND STRL (DRAPES) ×3 IMPLANT
DRAPE EXTREMITY T 121X128X90 (DRAPE) ×3 IMPLANT
DRAPE INCISE IOBAN 66X45 STRL (DRAPES) ×3 IMPLANT
DRAPE OEC MINIVIEW 54X84 (DRAPES) ×3 IMPLANT
DRAPE U-SHAPE 47X51 STRL (DRAPES) ×3 IMPLANT
DRSG EMULSION OIL 3X3 NADH (GAUZE/BANDAGES/DRESSINGS) ×3 IMPLANT
DRSG PAD ABDOMINAL 8X10 ST (GAUZE/BANDAGES/DRESSINGS) ×6 IMPLANT
DURAPREP 26ML APPLICATOR (WOUND CARE) ×3 IMPLANT
ELECT REM PT RETURN 9FT ADLT (ELECTROSURGICAL) ×3
ELECTRODE REM PT RTRN 9FT ADLT (ELECTROSURGICAL) ×1 IMPLANT
GLOVE BIO SURGEON STRL SZ8 (GLOVE) ×3 IMPLANT
GLOVE INDICATOR 8.0 STRL GRN (GLOVE) ×3 IMPLANT
GOWN W/2 COTTON TOWELS 2 STD (GOWNS) ×3 IMPLANT
GOWN XL W/COTTON TOWEL STD (GOWNS) ×3 IMPLANT
KIT ROOM TURNOVER WOR (KITS) ×3 IMPLANT
NEEDLE HYPO 22GX1.5 SAFETY (NEEDLE) ×3 IMPLANT
NEEDLE HYPO 25X1 1.5 SAFETY (NEEDLE) IMPLANT
NS IRRIG 500ML POUR BTL (IV SOLUTION) ×3 IMPLANT
PACK BASIN DAY SURGERY FS (CUSTOM PROCEDURE TRAY) ×3 IMPLANT
PAD ABD 8X10 STRL (GAUZE/BANDAGES/DRESSINGS) ×3 IMPLANT
PAD CAST 4YDX4 CTTN HI CHSV (CAST SUPPLIES) ×1 IMPLANT
PADDING CAST ABS 4INX4YD NS (CAST SUPPLIES) ×2
PADDING CAST ABS COTTON 4X4 ST (CAST SUPPLIES) ×1 IMPLANT
PADDING CAST COTTON 4X4 STRL (CAST SUPPLIES) ×2
PENCIL BUTTON HOLSTER BLD 10FT (ELECTRODE) ×3 IMPLANT
SPONGE GAUZE 4X4 12PLY STER LF (GAUZE/BANDAGES/DRESSINGS) ×3 IMPLANT
STOCKINETTE IMPERVIOUS LG (DRAPES) ×3 IMPLANT
STRIP CLOSURE SKIN 1/2X4 (GAUZE/BANDAGES/DRESSINGS) ×2 IMPLANT
SUT ETHIBOND 1 OS 2 18 CR3 (SUTURE) IMPLANT
SUT ETHILON 4 0 PS 2 18 (SUTURE) IMPLANT
SUT MNCRL AB 4-0 PS2 18 (SUTURE) ×3 IMPLANT
SUT VIC AB 2-0 SH 27 (SUTURE) ×4
SUT VIC AB 2-0 SH 27XBRD (SUTURE) ×2 IMPLANT
SYR BULB IRRIGATION 50ML (SYRINGE) ×3 IMPLANT
SYR CONTROL 10ML LL (SYRINGE) ×3 IMPLANT
TOWEL OR 17X24 6PK STRL BLUE (TOWEL DISPOSABLE) ×6 IMPLANT
TUBE CONNECTING 12'X1/4 (SUCTIONS) ×1
TUBE CONNECTING 12X1/4 (SUCTIONS) ×2 IMPLANT
YANKAUER SUCT BULB TIP NO VENT (SUCTIONS) ×3 IMPLANT

## 2015-04-07 NOTE — Op Note (Signed)
NAME:  Mitchell Bell, Mitchell Bell                ACCOUNT NO.:  [id number]  MEDICAL RECORD NO.:  [id number]  LOCATION:                                 FACILITY:  PHYSICIAN:  Ollen GrossFrank Jules Baty, M.D.         DATE OF BIRTH:  DATE OF PROCEDURE:  04/07/2015 DATE OF DISCHARGE:                              OPERATIVE REPORT   PREOPERATIVE DIAGNOSIS:  Painful hardware, right knee.  POSTOPERATIVE DIAGNOSIS:  Painful hardware, right knee.  PROCEDURE:  Hardware removal, right patella.  SURGEON:  Ollen GrossFrank Samina Weekes, M.D.  ASSISTANT:  No assistant.  ANESTHESIA:  General.  ESTIMATED BLOOD LOSS:  Minimal.  DRAINS:  None.  TOURNIQUET TIME:  17 minutes at 300 mmHg.  COMPLICATIONS:  None.  CONDITION:  Stable to recovery.  BRIEF CLINICAL NOTE:  Mitchell Bell is a 55 year old male, had bilateral total knee arthroplasty couple of years ago.  Last year in August, he sustained a periprosthetic patella fracture.  It was treated with open reduction and internal fixation.  He has had recent pain related to his hardware with breakage of one of the pins.  He presents now for hardware removal.  PROCEDURE IN DETAIL:  After successful administration of general anesthetic, tourniquet was placed high on the right thigh and right lower extremity.  He was prepped and draped in the usual sterile fashion.  Extremity was wrapped with an Esmarch, tourniquet inflated to 300 mmHg.  Midline incision was made with a 10-blade to the subcutaneous tissue.  I was able to find one of the broken pins superolateral and I removed that portion of it.  I then used C-arm to identify two of the K- wires that went from inferior to superior.  I found those and removed the two K-wires.  I then found the cerclage wire, cut that with a wire cutter and removed the cerclage wire.  The extensor mechanism was completely intact.  He was able to do a straight leg raise preoperatively.  We then deflated the tourniquet for total time of 17 minutes.  The wound  was thoroughly irrigated with saline solution. Minor bleeding was stopped with electrocautery.  The subcutaneous tissue was then closed with interrupted 2-0 Vicryl and subcuticular running 4-0 Monocryl.  Incision was cleaned and dried, and Steri-Strips and a bulky sterile dressing applied.  He was then awakened and transported to recovery in stable condition.     Ollen GrossFrank Darlette Dubow, M.D.     FA/MEDQ  D:  04/07/2015  T:  04/07/2015  Job:  [id number]

## 2015-04-07 NOTE — Interval H&P Note (Signed)
History and Physical Interval Note:  04/07/2015 9:51 AM  Ernestina Pennaonald W Wordell  has presented today for surgery, with the diagnosis of STATUS POST  ORIF RIGHT PATELLA  FRACTURE ARTHROPLASTY  The various methods of treatment have been discussed with the patient and family. After consideration of risks, benefits and other options for treatment, the patient has consented to  Procedure(s): HARDWARE REMOVAL AND BONE FRAGMENTS,RIGHT PATELLA (Right) as a surgical intervention .  The patient's history has been reviewed, patient examined, no change in status, stable for surgery.  I have reviewed the patient's chart and labs.  Questions were answered to the patient's satisfaction.     Loanne DrillingALUISIO,Retha Bither V

## 2015-04-07 NOTE — Brief Op Note (Signed)
04/07/2015  11:20 AM  PATIENT:  Mitchell Bell  55 y.o. male  PRE-OPERATIVE DIAGNOSIS: Painful hardware right knee  POST-OPERATIVE DIAGNOSIS:  Painful hardware right knee  PROCEDURE:  Procedure(s): HARDWARE REMOVAL AND BONE FRAGMENTS,RIGHT PATELLA (Right)  SURGEON:  Surgeon(s) and Role:    * Ollen GrossFrank Evalyn Shultis, MD - Primary  PHYSICIAN ASSISTANT:   ASSISTANTS: none   ANESTHESIA:   general  EBL:  Total I/O In: 600 [I.V.:600] Out: 20 [Blood:20]  BLOOD ADMINISTERED:none  DRAINS: none   LOCAL MEDICATIONS USED:  MARCAINE     COUNTS:  YES  TOURNIQUET:  17 minutes @ 300 mm Hg  DICTATION: .Other Dictation: Dictation Number [id number]  PLAN OF CARE: Discharge to home after PACU  PATIENT DISPOSITION:  PACU - hemodynamically stable.

## 2015-04-07 NOTE — Anesthesia Preprocedure Evaluation (Addendum)
Anesthesia Evaluation  Patient identified by MRN, date of birth, ID band Patient awake    Reviewed: Allergy & Precautions, NPO status , Patient's Chart, lab work & pertinent test results  Airway Mallampati: I  TM Distance: >3 FB Neck ROM: Full    Dental  (+) Teeth Intact, Dental Advisory Given   Pulmonary    breath sounds clear to auscultation       Cardiovascular hypertension, Pt. on medications  Rhythm:Regular Rate:Normal     Neuro/Psych    GI/Hepatic GERD  Medicated and Controlled,  Endo/Other  obese  Renal/GU      Musculoskeletal  (+) Arthritis , Osteoarthritis,  Painful hardware knee   Abdominal   Peds  Hematology   Anesthesia Other Findings   Reproductive/Obstetrics                           Anesthesia Physical Anesthesia Plan  ASA: II  Anesthesia Plan: General   Post-op Pain Management:    Induction: Intravenous  Airway Management Planned: LMA  Additional Equipment:   Intra-op Plan:   Post-operative Plan: Extubation in OR  Informed Consent: I have reviewed the patients History and Physical, chart, labs and discussed the procedure including the risks, benefits and alternatives for the proposed anesthesia with the patient or authorized representative who has indicated his/her understanding and acceptance.   Dental advisory given  Plan Discussed with: CRNA, Anesthesiologist and Surgeon  Anesthesia Plan Comments:         Anesthesia Quick Evaluation

## 2015-04-07 NOTE — H&P (Signed)
  CC- Mitchell Bell is a 55 y.o. male who presents with right knee pain.  HPI- . Knee Pain: Patient presents with knee pain involving the  right knee. Onset of the symptoms was several weeks ago. Inciting event: none known. Current symptoms include anterior pain. He had ORIF right periprosthetic patella fracture on 09/10/14 and has hardware related pain. X-rays show a broken wire. He presents for hardware removal Past Medical History  Diagnosis Date  . History of kidney stones   . Anxiety   . GERD (gastroesophageal reflux disease)   . Hypertension   . ADHD (attention deficit hyperactivity disorder)   . Scoliosis     BACK PAIN  . History of chicken pox   . History of mumps as a child   . Schatzki's ring      per pt   . At risk for sleep apnea     STOP-BANG= 4      SENT TO PCP 09-10-2014  . Arthritis   . Right knee pain     s/p  ORIF right patella fx 09-10-2014 w/ retained hardware    Past Surgical History  Procedure Laterality Date  . Knee arthroscopy Bilateral 2003  . Total knee arthroplasty Bilateral 09/17/2013    Procedure: TOTAL KNEE BILATERAL;  Surgeon: Loanne DrillingFrank Dilia Alemany V, MD;  Location: [facility];  Service: Orthopedics;  Laterality: Bilateral;  . Orif patella Right 09/10/2014    Procedure: OPEN REDUCTION INTERNAL (ORIF) RIGHT PERI  PATELLA FRACTURE;  Surgeon: Ollen GrossFrank Alejandra Hunt, MD;  Location: [facility];  Service: Orthopedics;  Laterality: Right;  . Inguinal hernia repair Left age 89  . Orif left ankle fx  1990    retained hardware    Prior to Admission medications   Medication Sig Start Date End Date Taking? Authorizing Provider  atomoxetine (STRATTERA) 80 MG capsule Take 80 mg by mouth every morning.   Yes Historical Provider, MD  hydrochlorothiazide (MICROZIDE) 12.5 MG capsule Take 12.5 mg by mouth every morning.   Yes Historical Provider, MD  losartan (COZAAR) 50 MG tablet Take 50 mg by mouth every morning.   Yes Historical Provider, MD  NON FORMULARY Take 1 capsule by mouth daily.  Mega red   Yes Historical Provider, MD  omeprazole (PRILOSEC) 20 MG capsule Take 20 mg by mouth every morning.   Yes Historical Provider, MD  simvastatin (ZOCOR) 40 MG tablet Take 40 mg by mouth at bedtime.   Yes Historical Provider, MD   KNEE EXAM soft tissue tenderness over anterior knee, no effusion, collateral ligaments intact  Physical Examination: General appearance - alert, well appearing, and in no distress Mental status - alert, oriented to person, place, and time Chest - clear to auscultation, no wheezes, rales or rhonchi, symmetric air entry Heart - normal rate, regular rhythm, normal S1, S2, no murmurs, rubs, clicks or gallops Abdomen - soft, nontender, nondistended, no masses or organomegaly Neurological - alert, oriented, normal speech, no focal findings or movement disorder noted   Asessment/Plan--- Right knee painful hardware- - Plan right knee hardware removal. Procedure risks and potential comps discussed with patient who elects to proceed. Goals are decreased pain and increased function with a high likelihood of achieving both

## 2015-04-07 NOTE — Discharge Instructions (Signed)
Call your surgeon if you experience:  ° °1.  Fever over 101.0. °2.  Nausea and/or vomiting. °3.  Extreme swelling or bruising at the surgical site. °4.  Continued bleeding from the incision. °5.  Increased pain, redness or drainage from the incision. °6.  Problems related to your pain medication. °7. Any change in color, movement and/or sensation °8. Any problems and/or concerns ° ° ° °Post Anesthesia Home Care Instructions ° °Activity: °Get plenty of rest for the remainder of the day. A responsible adult should stay with you for 24 hours following the procedure.  °For the next 24 hours, DO NOT: °-Drive a car °-Operate machinery °-Drink alcoholic beverages °-Take any medication unless instructed by your physician °-Make any legal decisions or sign important papers. ° °Meals: °Start with liquid foods such as gelatin or soup. Progress to regular foods as tolerated. Avoid greasy, spicy, heavy foods. If nausea and/or vomiting occur, drink only clear liquids until the nausea and/or vomiting subsides. Call your physician if vomiting continues. ° °Special Instructions/Symptoms: °Your throat may feel dry or sore from the anesthesia or the breathing tube placed in your throat during surgery. If this causes discomfort, gargle with warm salt water. The discomfort should disappear within 24 hours. ° °If you had a scopolamine patch placed behind your ear for the management of post- operative nausea and/or vomiting: ° °1. The medication in the patch is effective for 72 hours, after which it should be removed.  Wrap patch in a tissue and discard in the trash. Wash hands thoroughly with soap and water. °2. You may remove the patch earlier than 72 hours if you experience unpleasant side effects which may include dry mouth, dizziness or visual disturbances. °3. Avoid touching the patch. Wash your hands with soap and water after contact with the patch. °  ° °

## 2015-04-07 NOTE — Transfer of Care (Signed)
Immediate Anesthesia Transfer of Care Note  Patient: Mitchell Bell  Procedure(s) Performed: Procedure(s): HARDWARE REMOVAL AND BONE FRAGMENTS,RIGHT PATELLA (Right)  Patient Location: PACU  Anesthesia Type:General  Level of Consciousness: awake, alert , oriented and patient cooperative  Airway & Oxygen Therapy: Patient Spontanous Breathing and Patient connected to nasal cannula oxygen  Post-op Assessment: Report given to RN and Post -op Vital signs reviewed and stable  Post vital signs: Reviewed and stable 99% SaO2 BP 121/74 80-20  Last Vitals:  Filed Vitals:   04/07/15 0835  BP: 137/92  Pulse: 83  Temp: 36.9 C  Resp: 16    Complications: No apparent anesthesia complications

## 2015-04-07 NOTE — Anesthesia Procedure Notes (Signed)
Procedure Name: LMA Insertion Date/Time: 04/07/2015 10:31 AM Performed by: Jessica PriestBEESON, Ola Fawver C Pre-anesthesia Checklist: Patient identified, Emergency Drugs available, Suction available and Patient being monitored Patient Re-evaluated:Patient Re-evaluated prior to inductionOxygen Delivery Method: Circle System Utilized Preoxygenation: Pre-oxygenation with 100% oxygen Intubation Type: IV induction Ventilation: Mask ventilation without difficulty LMA: LMA inserted LMA Size: 4.0 Number of attempts: 1 Airway Equipment and Method: Bite block Placement Confirmation: positive ETCO2 Tube secured with: Tape Dental Injury: Teeth and Oropharynx as per pre-operative assessment

## 2015-04-08 ENCOUNTER — Encounter (HOSPITAL_BASED_OUTPATIENT_CLINIC_OR_DEPARTMENT_OTHER): Payer: Self-pay | Admitting: Orthopedic Surgery

## 2015-04-09 NOTE — Anesthesia Postprocedure Evaluation (Signed)
Anesthesia Post Note  Patient: Mitchell PennaDonald W Bell  Procedure(s) Performed: Procedure(s) (LRB): HARDWARE REMOVAL AND BONE FRAGMENTS,RIGHT PATELLA (Right)  Patient location during evaluation: PACU Anesthesia Type: General Level of consciousness: awake and alert Pain management: pain level controlled Vital Signs Assessment: post-procedure vital signs reviewed and stable Respiratory status: spontaneous breathing, nonlabored ventilation and respiratory function stable Cardiovascular status: blood pressure returned to baseline and stable Postop Assessment: no signs of nausea or vomiting Anesthetic complications: no    Last Vitals:  Filed Vitals:   04/07/15 1155 04/07/15 1200  BP:  115/69  Pulse: 80 74  Temp:    Resp: 16 16    Last Pain:  Filed Vitals:   04/08/15 1311  PainSc: 1                  Vy Badley A

## 2016-10-25 IMAGING — RF DG KNEE 1-2V*R*
1 series · 2 of 2 positions shown · non-contrast
Comparison: None.

CLINICAL DATA: ORIF of right patellar fracture.

EXAM:
RIGHT KNEE - 1-2 VIEW

[Series 1: run · 2 of 2 slices shown]
[im 1/2]
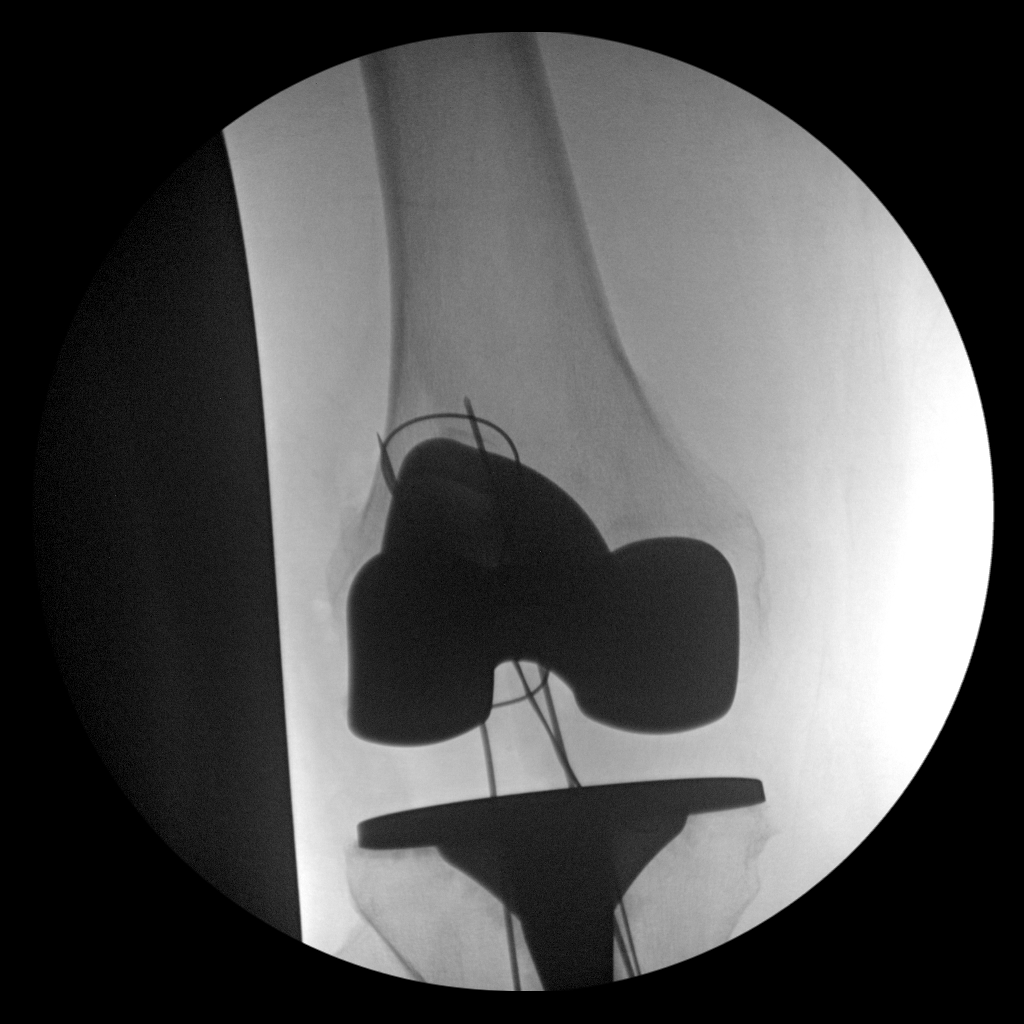
[im 2/2]
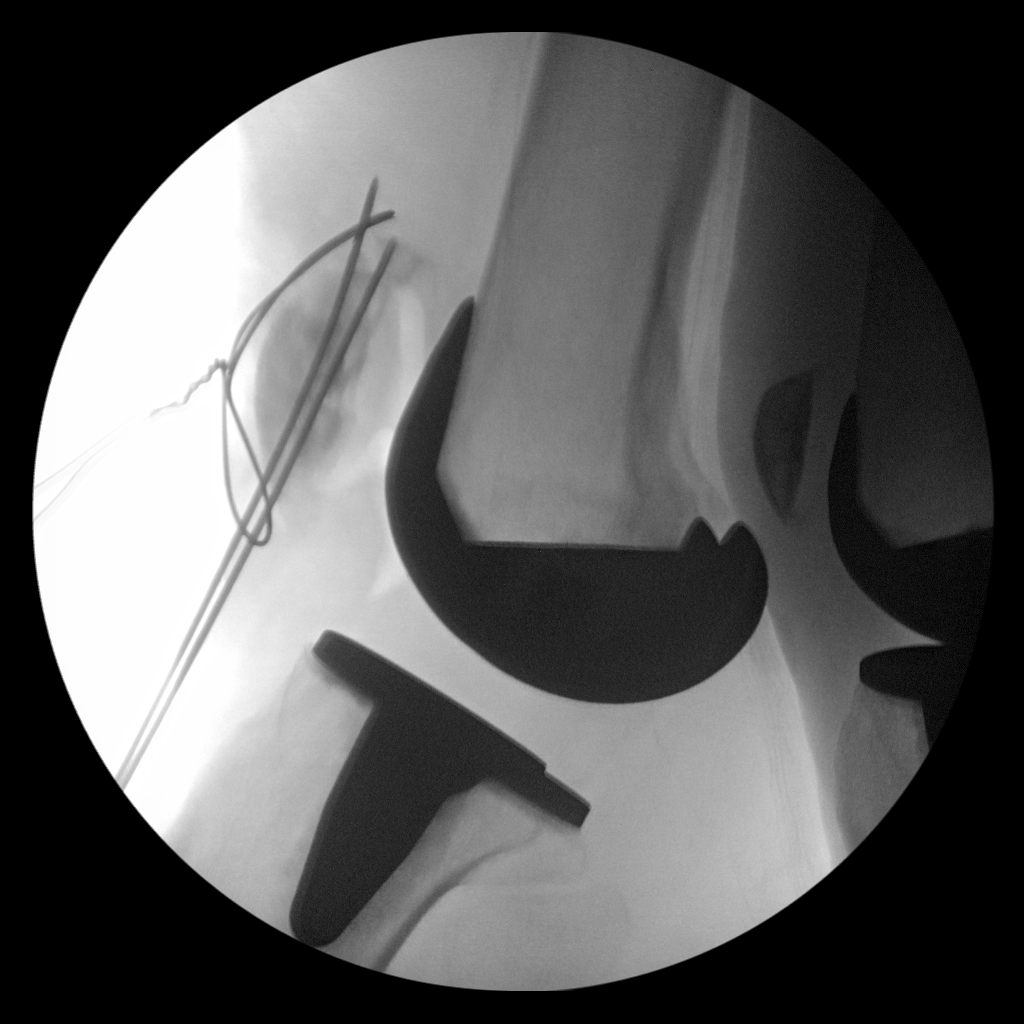

[2 of 2 positions shown; findings below may reference images not displayed]

FINDINGS: Two fluoroscopic images were obtained of the right knee. There is a
total knee arthroplasty. There is a cerclage wire and 2 surgical
pins involving the patella.
IMPRESSION: Internal fixation of right patella fracture.

## 2019-12-03 ENCOUNTER — Ambulatory Visit: Payer: BLUE CROSS/BLUE SHIELD | Admitting: Urology

## 2019-12-10 ENCOUNTER — Other Ambulatory Visit: Payer: Self-pay

## 2019-12-10 ENCOUNTER — Ambulatory Visit (INDEPENDENT_AMBULATORY_CARE_PROVIDER_SITE_OTHER): Payer: BLUE CROSS/BLUE SHIELD | Admitting: Urology

## 2019-12-10 ENCOUNTER — Encounter: Payer: Self-pay | Admitting: Urology

## 2019-12-10 VITALS — BP 124/70 | HR 66 | Temp 98.5°F | Ht 70.5 in | Wt 217.0 lb

## 2019-12-10 DIAGNOSIS — R3912 Poor urinary stream: Secondary | ICD-10-CM | POA: Insufficient documentation

## 2019-12-10 DIAGNOSIS — R310 Gross hematuria: Secondary | ICD-10-CM | POA: Insufficient documentation

## 2019-12-10 DIAGNOSIS — N138 Other obstructive and reflux uropathy: Secondary | ICD-10-CM

## 2019-12-10 DIAGNOSIS — N401 Enlarged prostate with lower urinary tract symptoms: Secondary | ICD-10-CM

## 2019-12-10 LAB — URINALYSIS, ROUTINE W REFLEX MICROSCOPIC
Bilirubin, UA: NEGATIVE
Glucose, UA: NEGATIVE
Ketones, UA: NEGATIVE
Leukocytes,UA: NEGATIVE
Nitrite, UA: NEGATIVE
Protein,UA: NEGATIVE
Specific Gravity, UA: 1.015 (ref 1.005–1.030)
Urobilinogen, Ur: 0.2 mg/dL (ref 0.2–1.0)
pH, UA: 6 (ref 5.0–7.5)

## 2019-12-10 LAB — MICROSCOPIC EXAMINATION
Bacteria, UA: NONE SEEN
Epithelial Cells (non renal): NONE SEEN /hpf (ref 0–10)
RBC, Urine: NONE SEEN /hpf (ref 0–2)
Renal Epithel, UA: NONE SEEN /hpf
WBC, UA: NONE SEEN /hpf (ref 0–5)

## 2019-12-10 MED ORDER — SILODOSIN 8 MG PO CAPS: 8.0000 mg | ORAL_CAPSULE | Freq: Every day | ORAL | 11 refills | Status: DC

## 2019-12-10 NOTE — Patient Instructions (Signed)
Transurethral Resection of the Prostate Transurethral resection of the prostate (TURP) is the removal (resection) of part of the gland that produces semen (prostate gland). This procedure is done to treat benign prostatic hyperplasia (BPH). BPH is an abnormal, noncancerous (benign) increase in the number of cells that make up the prostate tissue. BPH causes the prostate to get bigger. The enlarged prostate can push against or block the tube that drains urine from the bladder out of the body (urethra). BPH can affect normal urine flow by causing bladder infections, difficulty controlling bladder function, and difficulty emptying the bladder.  The goal of TURP is to remove enough prostate tissue to allow for a normal flow of urine. The procedure will allow you to empty your bladder more completely when you urinate so that you can urinate less often. In a transurethral resection, a thin telescope with a light, a tiny camera, and an electric cutting edge (resectoscope) is passed through the urethra and into the prostate. The opening of the urethra is at the end of the penis. Tell a health care provider about:  Any allergies you have.  All medicines you are taking, including vitamins, herbs, eye drops, creams, and over-the-counter medicines.  Any problems you or family members have had with anesthetic medicines.  Any blood disorders you have.  Any surgeries you have had.  Any medical conditions you have.  Any prostate infections you have had. What are the risks? Generally, this is a safe procedure. However, problems may occur, including:  Infection.  Bleeding.  Allergic reactions to medicines.  Damage to other structures or organs, such as: ? The urethra. ? The bladder. ? Muscles that surround the prostate.  Difficulty getting an erection.  Inability to control when you urinate (incontinence).  Scarring, which may cause problems with urine flow. What happens before the  procedure? Medicines Ask your health care provider about:  Changing or stopping your regular medicines. This is especially important if you are taking diabetes medicines or blood thinners.  Taking medicines such as aspirin and ibuprofen. These medicines can thin your blood. Do not take these medicines unless your health care provider tells you to take them.  Taking over-the-counter medicines, vitamins, herbs, and supplements. Eating and drinking Follow instructions from your health care provider about eating and drinking, which may include:  8 hours before the procedure - stop eating heavy meals or foods, such as meat, fried foods, or fatty foods.  6 hours before the procedure - stop eating light meals or foods, such as toast or cereal.  6 hours before the procedure - stop drinking milk or drinks that contain milk.  2 hours before the procedure - stop drinking clear liquids. Staying hydrated Follow instructions from your health care provider about hydration, which may include:  Up to 2 hours before the procedure - you may continue to drink clear liquids, such as water, clear fruit juice, black coffee, and plain tea. General instructions  You may have a physical exam.  You may have a blood or urine sample taken.  Ask your health care provider what steps will be taken to help prevent infection. These may include: ? Washing skin with a germ-killing soap. ? Taking antibiotic medicine.  Plan to have someone take you home from the hospital or clinic. You may not be able to drive for up to 10 days after your procedure.  Plan to have a responsible adult care for you for at least 24 hours after you leave the hospital or   clinic. This is important. What happens during the procedure?   An IV will be inserted into one of your veins.  You will be given one or more of the following: ? A medicine to help you relax (sedative). ? A medicine to make you fall asleep (general anesthetic). ? A  medicine that is injected into your spine to numb the area below and slightly above the injection site (spinal anesthetic).  Your legs will be placed in foot rests (stirrups) so that your legs are apart and your knees are bent.  The resectoscope will be passed through your urethra to your prostate.  Parts of your prostate will be resected using the cutting edge of the resectoscope.  The resectoscope will be removed.  A small, thin tube (catheter) will be passed through your urethra and into your bladder. The catheter will drain urine into a bag outside of your body. ? Fluid may be passed through the catheter to keep the catheter open. The procedure may vary among health care providers and hospitals. What happens after the procedure?  Your blood pressure, heart rate, breathing rate, and blood oxygen level will be monitored until you leave the hospital or clinic.  You may continue to receive fluids and medicines through an IV.  You may have some pain. Pain medicine will be available to help you.  You will have a catheter draining your urine. ? You may have blood in your urine. Your catheter may be kept in until your urine is clear. ? Your urinary drainage will be monitored. If necessary, your bladder may be rinsed out (irrigated) through your catheter.  You will be encouraged to walk around as soon as possible.  You may have to wear compression stockings. These stockings help prevent blood clots and reduce swelling in your legs.  Do not drive for 24 hours if you were given a sedative during your procedure. Summary  Transurethral resection of the prostate (TURP) is the removal (resection) of part of the gland that produces semen (prostate gland).  The goal of this procedure is to remove enough prostate tissue to allow for a normal flow of urine.  Follow instructions from your health care provider about taking medicines and about eating and drinking before the procedure. This  information is not intended to replace advice given to you by your health care provider. Make sure you discuss any questions you have with your health care provider. Document Revised: 05/01/2018 Document Reviewed: 10/10/2017 [employer] Patient Education  2020 [employer]  

## 2019-12-10 NOTE — Progress Notes (Signed)
12/10/2019 4:18 PM   Mitchell Bell 18-Nov-1960 [id number]  Referring provider: Foye Deer, MD [address] [place],  [place] [address]  Followup Gross hematuria  HPI: Mr Mitchell Bell is a 59yo here for followup for gross hematuria and weak urinary stream. Last visit he was started on uroxatral which failed to improve his weak stream, urgency, and dribbling. He is unhappy with his urination. No hematuria since last visit    His records from [facility] are as follows: I have blood in my urine.  HPI: Mitchell Bell is a 59 year-old male established patient who is here for blood in the urine.  He did see the blood in his urine. He has not seen blood clots.   He does not have a burning sensation when he urinates. He is not currently having trouble urinating.   He is not having pain. He has not recently had unwanted weight loss.   11/07/2018: 6 days ago he developed gross painless hematuria. NO hx of nephrolithiasis. NO tobacco abuse hx.   11/28/2018: CT shows bilateral tiny renal calculi.   05/29/2019: He had a gross hematuria episode in January that last 1/2 day. He is taking finasteride. He thinks he may have passed a stone.     CC: I have weakness and slowing of my urinary stream.  HPI: He first noticed the symptom approximately 05/23/2016. He does have to strain or bear down to start his urinary stream. He does have to wait a long time to start his urinary stream.   He does not have an abnormal sensation when needing to urinate. He does not urinate more frequently than once every 4 hours in the daytime. He does not dribble at the end of urination. He does not have a split stream when he urinates.   He has been treated with Proscar. The patient has never had a surgical procedure for bladder outlet obstruction to his prostate.   05/29/2019: He is on finasteride which failed to improve his LUTS.     PMH: Past Medical History:  Diagnosis Date  . ADHD (attention deficit hyperactivity  disorder)   . Anxiety   . Arthritis   . At risk for sleep apnea    STOP-BANG= 4      SENT TO PCP 09-10-2014  . GERD (gastroesophageal reflux disease)   . History of chicken pox   . History of kidney stones   . History of mumps as a child   . Hypertension   . Right knee pain    s/p  ORIF right patella fx 09-10-2014 w/ retained hardware  . Schatzki's ring     per pt   . Scoliosis    BACK PAIN    Surgical History: Past Surgical History:  Procedure Laterality Date  . HARDWARE REMOVAL Right 04/07/2015   Procedure: HARDWARE REMOVAL AND BONE FRAGMENTS,RIGHT PATELLA;  Surgeon: Ollen Gross, MD;  Location: [facility];  Service: Orthopedics;  Laterality: Right;  . INGUINAL HERNIA REPAIR Left age 79  . KNEE ARTHROSCOPY Bilateral 2003  . ORIF LEFT ANKLE FX  1990   retained hardware  . ORIF PATELLA Right 09/10/2014   Procedure: OPEN REDUCTION INTERNAL (ORIF) RIGHT PERI  PATELLA FRACTURE;  Surgeon: Ollen Gross, MD;  Location: [facility];  Service: Orthopedics;  Laterality: Right;  . TOTAL KNEE ARTHROPLASTY Bilateral 09/17/2013   Procedure: TOTAL KNEE BILATERAL;  Surgeon: Loanne Drilling, MD;  Location: [facility];  Service: Orthopedics;  Laterality: Bilateral;    Home  Medications:  Allergies as of 12/10/2019   No Known Allergies     Medication List       Accurate as of December 10, 2019  4:18 PM. If you have any questions, ask your nurse or doctor.        alfuzosin 10 MG 24 hr tablet Commonly known as: UROXATRAL Take 10 mg by mouth daily.   aspirin 81 MG tablet Take 81 mg by mouth daily.   ASPIRIN 81 PO Aspir-81   atomoxetine 80 MG capsule Commonly known as: STRATTERA Take 80 mg by mouth every morning.   finasteride 5 MG tablet Commonly known as: PROSCAR Take 5 mg by mouth daily.   hydrochlorothiazide 12.5 MG capsule Commonly known as: MICROZIDE Take 12.5 mg by mouth every morning.   HYDROcodone-acetaminophen 5-325 MG tablet Commonly known as: Norco Take  1-2 tablets by mouth every 4 (four) hours as needed for moderate pain.   KRILL OIL OMEGA-3 PO Maximum Red Krill Omega-3   losartan 50 MG tablet Commonly known as: COZAAR Take 50 mg by mouth every morning.   methocarbamol 500 MG tablet Commonly known as: Robaxin Take 1 tablet (500 mg total) by mouth 4 (four) times daily. As needed for muscle spasm   NON FORMULARY Take 1 capsule by mouth daily. Mega red   omeprazole 20 MG capsule Commonly known as: PRILOSEC Take 20 mg by mouth every morning.   simvastatin 40 MG tablet Commonly known as: ZOCOR Take 40 mg by mouth at bedtime.   VITAMIN D PO Vitamin D       Allergies: No Known Allergies  Family History: No family history on file.  Social History:  reports that he has never smoked. He has never used smokeless tobacco. He reports current alcohol use of about 1.0 standard drink of alcohol per week. He reports that he does not use drugs.  ROS: All other review of systems were reviewed and are negative except what is noted above in HPI  Physical Exam: BP 124/70   Pulse 66   Temp 98.5 F (36.9 C)   Ht 5' 10.5" (1.791 m)   Wt 217 lb (98.4 kg)   BMI 30.70 kg/m   Constitutional:  Alert and oriented, No acute distress. HEENT: [place] AT, moist mucus membranes.  Trachea midline, no masses. Cardiovascular: No clubbing, cyanosis, or edema. Respiratory: Normal respiratory effort, no increased work of breathing. GI: Abdomen is soft, nontender, nondistended, no abdominal masses GU: No CVA tenderness.  Lymph: No cervical or inguinal lymphadenopathy. Skin: No rashes, bruises or suspicious lesions. Neurologic: Grossly intact, no focal deficits, moving all 4 extremities. Psychiatric: Normal mood and affect.  Laboratory Data: Lab Results  Component Value Date   WBC 5.6 09/09/2014   HGB 16.7 04/07/2015   HCT 49.0 04/07/2015   MCV 90.7 09/09/2014   PLT 191 09/09/2014    Lab Results  Component Value Date   CREATININE 0.70  04/07/2015    No results found for: PSA  No results found for: TESTOSTERONE  No results found for: HGBA1C  Urinalysis    Component Value Date/Time   COLORURINE YELLOW 09/05/2013 0805   APPEARANCEUR CLEAR 09/05/2013 0805   LABSPEC 1.021 09/05/2013 0805   PHURINE 6.5 09/05/2013 0805   GLUCOSEU NEGATIVE 09/05/2013 0805   HGBUR NEGATIVE 09/05/2013 0805   BILIRUBINUR NEGATIVE 09/05/2013 0805   KETONESUR NEGATIVE 09/05/2013 0805   PROTEINUR NEGATIVE 09/05/2013 0805   UROBILINOGEN 1.0 09/05/2013 0805   NITRITE NEGATIVE 09/05/2013 0805   LEUKOCYTESUR NEGATIVE  09/05/2013 0805    No results found for: LABMICR, WBCUA, RBCUA, LABEPIT, MUCUS, BACTERIA  Pertinent Imaging:  No results found for this or any previous visit.  No results found for this or any previous visit.  No results found for this or any previous visit.  No results found for this or any previous visit.  No results found for this or any previous visit.  No results found for this or any previous visit.  No results found for this or any previous visit.  No results found for this or any previous visit.   Assessment & Plan:    1. Gross hematuria -continue finasteride  2. Weak urinary stream We discussed the management including medical therapy, Rezum, TURP, and simple prostatectomy. We will trial rapaflo 8mg  and if it fails to improve his LUTS we will proceed with TURP on 01/22/2020.  - Urinalysis, Routine w reflex microscopic   No follow-ups on file.  01/24/2020, MD  [facility] [place]

## 2019-12-10 NOTE — Progress Notes (Signed)
Urological Symptom Review  Patient is experiencing the following symptoms: Hard to postpone urination Get up at night to urinate Leakage of urine Blood in urine Weak stream  Kidney stones   Review of Systems  Gastrointestinal (upper)  : Negative for upper GI symptoms  Gastrointestinal (lower) : Negative for lower GI symptoms  Constitutional : Negative for symptoms  Skin: Negative for skin symptoms  Eyes: Negative for eye symptoms  Ear/Nose/Throat : Negative for Ear/Nose/Throat symptoms  Hematologic/Lymphatic: Negative for Hematologic/Lymphatic symptoms  Cardiovascular : Negative for cardiovascular symptoms  Respiratory : Negative for respiratory symptoms  Endocrine: Negative for endocrine symptoms  Musculoskeletal: Negative for musculoskeletal symptoms  Neurological: Negative for neurological symptoms  Psychologic: Negative for psychiatric symptoms

## 2020-06-09 ENCOUNTER — Other Ambulatory Visit: Payer: Self-pay

## 2020-06-09 MED ORDER — MOLNUPIRAVIR 200 MG PO CAPS
ORAL_CAPSULE | ORAL | 0 refills | Status: DC
  Filled 2020-06-09: qty 40, 5d supply, fill #0

## 2020-06-11 ENCOUNTER — Ambulatory Visit: Payer: No Typology Code available for payment source | Admitting: Urology

## 2020-08-06 ENCOUNTER — Ambulatory Visit (INDEPENDENT_AMBULATORY_CARE_PROVIDER_SITE_OTHER): Payer: No Typology Code available for payment source | Admitting: Urology

## 2020-08-06 ENCOUNTER — Other Ambulatory Visit: Payer: Self-pay

## 2020-08-06 ENCOUNTER — Encounter: Payer: Self-pay | Admitting: Urology

## 2020-08-06 VITALS — BP 130/72 | HR 69

## 2020-08-06 DIAGNOSIS — N401 Enlarged prostate with lower urinary tract symptoms: Secondary | ICD-10-CM | POA: Diagnosis not present

## 2020-08-06 DIAGNOSIS — N138 Other obstructive and reflux uropathy: Secondary | ICD-10-CM

## 2020-08-06 DIAGNOSIS — R310 Gross hematuria: Secondary | ICD-10-CM

## 2020-08-06 DIAGNOSIS — R3912 Poor urinary stream: Secondary | ICD-10-CM

## 2020-08-06 DIAGNOSIS — I10 Essential (primary) hypertension: Secondary | ICD-10-CM

## 2020-08-06 LAB — URINALYSIS, ROUTINE W REFLEX MICROSCOPIC
Bilirubin, UA: NEGATIVE
Glucose, UA: NEGATIVE
Ketones, UA: NEGATIVE
Leukocytes,UA: NEGATIVE
Nitrite, UA: NEGATIVE
Protein,UA: NEGATIVE
RBC, UA: NEGATIVE
Specific Gravity, UA: 1.015 (ref 1.005–1.030)
Urobilinogen, Ur: 0.2 mg/dL (ref 0.2–1.0)
pH, UA: 6.5 (ref 5.0–7.5)

## 2020-08-06 LAB — BLADDER SCAN AMB NON-IMAGING: Scan Result: 184

## 2020-08-06 MED ORDER — SILODOSIN 8 MG PO CAPS
8.0000 mg | ORAL_CAPSULE | Freq: Every day | ORAL | 11 refills | Status: DC
Start: 2020-08-06 — End: 2021-08-22

## 2020-08-06 NOTE — Addendum Note (Signed)
Addended byGustavus Messing on: 08/06/2020 02:35 PM   Modules accepted: Orders

## 2020-08-06 NOTE — Patient Instructions (Signed)
Benign Prostatic Hyperplasia  Benign prostatic hyperplasia (BPH) is an enlarged prostate gland that is caused by the normal aging process and not by cancer. The prostate is a walnut-sized gland that is involved in the production of semen. It is located in front of the rectum and below the bladder. The bladder stores urine and the urethra is the tube that carries the urine out of the body. The prostate may get bigger asa man gets older. An enlarged prostate can press on the urethra. This can make it harder to pass urine. The build-up of urine in the bladder can cause infection. Back pressure and infection may progress to bladder damage and kidney (renal) failure. What are the causes? This condition is part of a normal aging process. However, not all men develop problems from this condition. If the prostate enlarges away from the urethra, urine flow will not be blocked. If it enlarges toward the urethra andcompresses it, there will be problems passing urine. What increases the risk? This condition is more likely to develop in men over the age of 50 years. What are the signs or symptoms? Symptoms of this condition include: Getting up often during the night to urinate. Needing to urinate frequently during the day. Difficulty starting urine flow. Decrease in size and strength of your urine stream. Leaking (dribbling) after urinating. Inability to pass urine. This needs immediate treatment. Inability to completely empty your bladder. Pain when you pass urine. This is more common if there is also an infection. Urinary tract infection (UTI). How is this diagnosed? This condition is diagnosed based on your medical history, a physical exam, and your symptoms. Tests will also be done, such as: A post-void bladder scan. This measures any amount of urine that may remain in your bladder after you finish urinating. A digital rectal exam. In a rectal exam, your health care provider checks your prostate by  putting a lubricated, gloved finger into your rectum to feel the back of your prostate gland. This exam detects the size of your gland and any abnormal lumps or growths. An exam of your urine (urinalysis). A prostate specific antigen (PSA) screening. This is a blood test used to screen for prostate cancer. An ultrasound. This test uses sound waves to electronically produce a picture of your prostate gland. Your health care provider may refer you to a specialist in kidney and prostate diseases (urologist). How is this treated? Once symptoms begin, your health care provider will monitor your condition (active surveillance or watchful waiting). Treatment for this condition will depend on the severity of your condition. Treatment may include: Observation and yearly exams. This may be the only treatment needed if your condition and symptoms are mild. Medicines to relieve your symptoms, including: Medicines to shrink the prostate. Medicines to relax the muscle of the prostate. Surgery in severe cases. Surgery may include: Prostatectomy. In this procedure, the prostate tissue is removed completely through an open incision or with a laparoscope or robotics. Transurethral resection of the prostate (TURP). In this procedure, a tool is inserted through the opening at the tip of the penis (urethra). It is used to cut away tissue of the inner core of the prostate. The pieces are removed through the same opening of the penis. This removes the blockage. Transurethral incision (TUIP). In this procedure, small cuts are made in the prostate. This lessens the prostate's pressure on the urethra. Transurethral microwave thermotherapy (TUMT). This procedure uses microwaves to create heat. The heat destroys and removes a small   amount of prostate tissue. Transurethral needle ablation (TUNA). This procedure uses radio frequencies to destroy and remove a small amount of prostate tissue. Interstitial laser coagulation (ILC).  This procedure uses a laser to destroy and remove a small amount of prostate tissue. Transurethral electrovaporization (TUVP). This procedure uses electrodes to destroy and remove a small amount of prostate tissue. Prostatic urethral lift. This procedure inserts an implant to push the lobes of the prostate away from the urethra. Follow these instructions at home: Take over-the-counter and prescription medicines only as told by your health care provider. Monitor your symptoms for any changes. Contact your health care provider with any changes. Avoid drinking large amounts of liquid before going to bed or out in public. Avoid or reduce how much caffeine or alcohol you drink. Give yourself time when you urinate. Keep all follow-up visits as told by your health care provider. This is important. Contact a health care provider if: You have unexplained back pain. Your symptoms do not get better with treatment. You develop side effects from the medicine you are taking. Your urine becomes very dark or has a bad smell. Your lower abdomen becomes distended and you have trouble passing your urine. Get help right away if: You have a fever or chills. You suddenly cannot urinate. You feel lightheaded, or very dizzy, or you faint. There are large amounts of blood or clots in the urine. Your urinary problems become hard to manage. You develop moderate to severe low back or flank pain. The flank is the side of your body between the ribs and the hip. These symptoms may represent a serious problem that is an emergency. Do not wait to see if the symptoms will go away. Get medical help right away. Call your local emergency services (911 in the [place] Do not drive yourself to the hospital. Summary Benign prostatic hyperplasia (BPH) is an enlarged prostate that is caused by the normal aging process and not by cancer. An enlarged prostate can press on the urethra. This can make it hard to pass urine. This  condition is part of a normal aging process and is more likely to develop in men over the age of 50 years. Get help right away if you suddenly cannot urinate. This information is not intended to replace advice given to you by your health care provider. Make sure you discuss any questions you have with your healthcare provider. Document Revised: 09/18/2019 Document Reviewed: 09/18/2019 Elsevier Patient Education  2022 Elsevier Inc.  

## 2020-08-06 NOTE — Progress Notes (Signed)
08/06/2020 9:00 AM   Mitchell Bell May 09, 1960 [id number]  Referring provider: Catha Gosselin, MD [address] [place],  [place] [address]  Followup BPH and gross hematuria   HPI: Mitchell Bell is a 59yo here for followup for BPH and gross hematuria. No hematuria since last visit. IPSS 7 QOL1. He is on rapaflo 8mg  daily. He is also on finasteride 5mg  daily. He is having decreased libido on finasteride   PMH: Past Medical History:  Diagnosis Date   ADHD (attention deficit hyperactivity disorder)    Anxiety    Arthritis    At risk for sleep apnea    STOP-BANG= 4      SENT TO PCP 09-10-2014   GERD (gastroesophageal reflux disease)    History of chicken pox    History of kidney stones    History of mumps as a child    Hypertension    Right knee pain    s/p  ORIF right patella fx 09-10-2014 w/ retained hardware   Schatzki's ring     per pt    Scoliosis    BACK PAIN    Surgical History: Past Surgical History:  Procedure Laterality Date   HARDWARE REMOVAL Right 04/07/2015   Procedure: HARDWARE REMOVAL AND BONE FRAGMENTS,RIGHT PATELLA;  Surgeon: 09-12-2014, MD;  Location: [facility];  Service: Orthopedics;  Laterality: Right;   INGUINAL HERNIA REPAIR Left age 21   KNEE ARTHROSCOPY Bilateral 2003   ORIF LEFT ANKLE FX  1990   retained hardware   ORIF PATELLA Right 09/10/2014   Procedure: OPEN REDUCTION INTERNAL (ORIF) RIGHT PERI  PATELLA FRACTURE;  Surgeon: 2004, MD;  Location: [facility];  Service: Orthopedics;  Laterality: Right;   TOTAL KNEE ARTHROPLASTY Bilateral 09/17/2013   Procedure: TOTAL KNEE BILATERAL;  Surgeon: Ollen Gross, MD;  Location: [facility];  Service: Orthopedics;  Laterality: Bilateral;    Home Medications:  Allergies as of 08/06/2020   No Known Allergies      Medication List        Accurate as of August 06, 2020  9:00 AM. If you have any questions, ask your nurse or doctor.          alfuzosin 10 MG 24 hr  tablet Commonly known as: UROXATRAL Take 10 mg by mouth daily.   aspirin 81 MG tablet Take 81 mg by mouth daily.   ASPIRIN 81 PO Aspir-81   atomoxetine 80 MG capsule Commonly known as: STRATTERA Take 80 mg by mouth every morning.   finasteride 5 MG tablet Commonly known as: PROSCAR Take 5 mg by mouth daily.   hydrochlorothiazide 12.5 MG capsule Commonly known as: MICROZIDE Take 12.5 mg by mouth every morning.   HYDROcodone-acetaminophen 5-325 MG tablet Commonly known as: Norco Take 1-2 tablets by mouth every 4 (four) hours as needed for moderate pain.   KRILL OIL OMEGA-3 PO Maximum Red Krill Omega-3   losartan 50 MG tablet Commonly known as: COZAAR Take 50 mg by mouth every morning.   methocarbamol 500 MG tablet Commonly known as: Robaxin Take 1 tablet (500 mg total) by mouth 4 (four) times daily. As needed for muscle spasm   Molnupiravir 200 MG Caps Take 4 capsule by mouth twice a day for 5 days. (Take 4 capsule by mouth twice a day for 5 days.)   NON FORMULARY Take 1 capsule by mouth daily. Mega red   omeprazole 20 MG capsule Commonly known as: PRILOSEC Take 20 mg by mouth every  morning.   silodosin 8 MG Caps capsule Commonly known as: RAPAFLO Take 1 capsule (8 mg total) by mouth daily with breakfast.   simvastatin 40 MG tablet Commonly known as: ZOCOR Take 40 mg by mouth at bedtime.   VITAMIN D PO Vitamin D        Allergies: No Known Allergies  Family History: No family history on file.  Social History:  reports that he has never smoked. He has never used smokeless tobacco. He reports current alcohol use of about 1.0 standard drink of alcohol per week. He reports that he does not use drugs.  ROS: All other review of systems were reviewed and are negative except what is noted above in HPI  Physical Exam: BP 130/72   Pulse 69   Constitutional:  Alert and oriented, No acute distress. HEENT: [place] AT, moist mucus membranes.  Trachea midline, no  masses. Cardiovascular: No clubbing, cyanosis, or edema. Respiratory: Normal respiratory effort, no increased work of breathing. GI: Abdomen is soft, nontender, nondistended, no abdominal masses GU: No CVA tenderness.  Lymph: No cervical or inguinal lymphadenopathy. Skin: No rashes, bruises or suspicious lesions. Neurologic: Grossly intact, no focal deficits, moving all 4 extremities. Psychiatric: Normal mood and affect.  Laboratory Data: Lab Results  Component Value Date   WBC 5.6 09/09/2014   HGB 16.7 04/07/2015   HCT 49.0 04/07/2015   MCV 90.7 09/09/2014   PLT 191 09/09/2014    Lab Results  Component Value Date   CREATININE 0.70 04/07/2015    No results found for: PSA  No results found for: TESTOSTERONE  No results found for: HGBA1C  Urinalysis    Component Value Date/Time   COLORURINE YELLOW 09/05/2013 0805   APPEARANCEUR Clear 12/10/2019 1621   LABSPEC 1.021 09/05/2013 0805   PHURINE 6.5 09/05/2013 0805   GLUCOSEU Negative 12/10/2019 1621   HGBUR NEGATIVE 09/05/2013 0805   BILIRUBINUR Negative 12/10/2019 1621   KETONESUR NEGATIVE 09/05/2013 0805   PROTEINUR Negative 12/10/2019 1621   PROTEINUR NEGATIVE 09/05/2013 0805   UROBILINOGEN 1.0 09/05/2013 0805   NITRITE Negative 12/10/2019 1621   NITRITE NEGATIVE 09/05/2013 0805   LEUKOCYTESUR Negative 12/10/2019 1621    Lab Results  Component Value Date   LABMICR See below: 12/10/2019   WBCUA None seen 12/10/2019   LABEPIT None seen 12/10/2019   BACTERIA None seen 12/10/2019    Pertinent Imaging:  No results found for this or any previous visit.  No results found for this or any previous visit.  No results found for this or any previous visit.  No results found for this or any previous visit.  No results found for this or any previous visit.  No results found for this or any previous visit.  No results found for this or any previous visit.  No results found for this or any previous  visit.   Assessment & Plan:    1. Gross hematuria -Resolved - Urinalysis, Routine w reflex microscopic  2. Weak urinary stream -continue rapaflo 8mg  daily  3. Benign prostatic hyperplasia with urinary obstruction -continue rapaflo 8mg  daily. Stop finasteride - BLADDER SCAN AMB NON-IMAGING   No follow-ups on file.  , MD  [facility] [place]

## 2020-08-06 NOTE — Progress Notes (Signed)
post void residual=184  Urological Symptom Review  Patient is experiencing the following symptoms: Frequent urination Hard to postpone urination Leakage of urine Weak stream(sometimes)   Review of Systems  Gastrointestinal (upper)  : Negative for upper GI symptoms  Gastrointestinal (lower) : Negative for lower GI symptoms  Constitutional : Negative for symptoms  Skin: Negative for skin symptoms  Eyes: Negative for eye symptoms  Ear/Nose/Throat : Negative for Ear/Nose/Throat symptoms  Hematologic/Lymphatic: Negative for Hematologic/Lymphatic symptoms  Cardiovascular : Negative for cardiovascular symptoms  Respiratory : Negative for respiratory symptoms  Endocrine: Negative for endocrine symptoms  Musculoskeletal: Negative for musculoskeletal symptoms  Neurological: Negative for neurological symptoms  Psychologic: Negative for psychiatric symptoms

## 2020-08-26 ENCOUNTER — Telehealth: Payer: Self-pay

## 2020-08-26 DIAGNOSIS — N138 Other obstructive and reflux uropathy: Secondary | ICD-10-CM

## 2020-08-26 NOTE — Telephone Encounter (Signed)
Left a Voice Message:  Patient started back on a medication he was taken off of. He is needing a refill.  Please advise.  Call back:  [phone]  Thanks, Rosey Bath

## 2020-08-30 NOTE — Telephone Encounter (Signed)
Left message to return call to discuss medication that is needed.

## 2020-08-31 MED ORDER — FINASTERIDE 5 MG PO TABS: 5.0000 mg | ORAL_TABLET | Freq: Every day | ORAL | 3 refills | Status: DC

## 2020-08-31 NOTE — Telephone Encounter (Signed)
Patient wishes to start taking his finasteride medication again. Reviewed with Dr. Ronne Binning- refilled submitted to pharmacy. Called patient and notified.

## 2021-02-15 ENCOUNTER — Ambulatory Visit: Payer: No Typology Code available for payment source | Admitting: Nurse Practitioner

## 2021-05-31 DIAGNOSIS — D225 Melanocytic nevi of trunk: Secondary | ICD-10-CM | POA: Diagnosis not present

## 2021-05-31 DIAGNOSIS — L570 Actinic keratosis: Secondary | ICD-10-CM | POA: Diagnosis not present

## 2021-05-31 DIAGNOSIS — D1801 Hemangioma of skin and subcutaneous tissue: Secondary | ICD-10-CM | POA: Diagnosis not present

## 2021-05-31 DIAGNOSIS — L820 Inflamed seborrheic keratosis: Secondary | ICD-10-CM | POA: Diagnosis not present

## 2021-05-31 DIAGNOSIS — L918 Other hypertrophic disorders of the skin: Secondary | ICD-10-CM | POA: Diagnosis not present

## 2021-07-15 DIAGNOSIS — Z09 Encounter for follow-up examination after completed treatment for conditions other than malignant neoplasm: Secondary | ICD-10-CM | POA: Diagnosis not present

## 2021-07-15 DIAGNOSIS — K648 Other hemorrhoids: Secondary | ICD-10-CM | POA: Diagnosis not present

## 2021-07-15 DIAGNOSIS — D122 Benign neoplasm of ascending colon: Secondary | ICD-10-CM | POA: Diagnosis not present

## 2021-07-15 DIAGNOSIS — K5730 Diverticulosis of large intestine without perforation or abscess without bleeding: Secondary | ICD-10-CM | POA: Diagnosis not present

## 2021-07-15 DIAGNOSIS — Z86010 Personal history of colonic polyps: Secondary | ICD-10-CM | POA: Diagnosis not present

## 2021-08-19 ENCOUNTER — Ambulatory Visit: Payer: BC Managed Care – PPO | Admitting: Urology

## 2021-08-19 VITALS — BP 135/81 | HR 79 | Ht 70.5 in | Wt 230.0 lb

## 2021-08-19 DIAGNOSIS — N401 Enlarged prostate with lower urinary tract symptoms: Secondary | ICD-10-CM

## 2021-08-19 DIAGNOSIS — N138 Other obstructive and reflux uropathy: Secondary | ICD-10-CM | POA: Diagnosis not present

## 2021-08-19 DIAGNOSIS — R310 Gross hematuria: Secondary | ICD-10-CM

## 2021-08-19 DIAGNOSIS — R3912 Poor urinary stream: Secondary | ICD-10-CM | POA: Diagnosis not present

## 2021-08-19 LAB — URINALYSIS, ROUTINE W REFLEX MICROSCOPIC
Bilirubin, UA: NEGATIVE
Glucose, UA: NEGATIVE
Ketones, UA: NEGATIVE
Leukocytes,UA: NEGATIVE
Nitrite, UA: NEGATIVE
Protein,UA: NEGATIVE
Specific Gravity, UA: 1.015 (ref 1.005–1.030)
Urobilinogen, Ur: 0.2 mg/dL (ref 0.2–1.0)
pH, UA: 5.5 (ref 5.0–7.5)

## 2021-08-19 LAB — MICROSCOPIC EXAMINATION
Epithelial Cells (non renal): NONE SEEN /hpf (ref 0–10)
Renal Epithel, UA: NONE SEEN /hpf
WBC, UA: NONE SEEN /hpf (ref 0–5)

## 2021-08-19 MED ORDER — CIPROFLOXACIN HCL 500 MG PO TABS
500.0000 mg | ORAL_TABLET | Freq: Once | ORAL | Status: AC
  Administered 2021-08-19: 500 mg via ORAL

## 2021-08-19 NOTE — H&P (View-Only) (Signed)
08/19/2021 9:10 AM   Mitchell Bell 18-Dec-1960 [id number]  Referring provider: Catha Gosselin, MD [address] [place],  [place] [address]  Hematuria and BPH   HPI: Mitchell Bell is a 60yo here for followup for BPH and gross hematuria. IPSS 6 QOL 1. He is on rapaflo 8mg  and finasteride 5mg . He wishes to proceed with surgery for his BPH because he wants to be off medication.    PMH: Past Medical History:  Diagnosis Date   ADHD (attention deficit hyperactivity disorder)    Anxiety    Arthritis    At risk for sleep apnea    STOP-BANG= 4      SENT TO PCP 09-10-2014   GERD (gastroesophageal reflux disease)    History of chicken pox    History of kidney stones    History of mumps as a child    Hypertension    Right knee pain    s/p  ORIF right patella fx 09-10-2014 w/ retained hardware   Schatzki's ring     per pt    Scoliosis    BACK PAIN    Surgical History: Past Surgical History:  Procedure Laterality Date   HARDWARE REMOVAL Right 04/07/2015   Procedure: HARDWARE REMOVAL AND BONE FRAGMENTS,RIGHT PATELLA;  Surgeon: 09-12-2014, MD;  Location: [facility];  Service: Orthopedics;  Laterality: Right;   INGUINAL HERNIA REPAIR Left age 43   KNEE ARTHROSCOPY Bilateral 2003   ORIF LEFT ANKLE FX  1990   retained hardware   ORIF PATELLA Right 09/10/2014   Procedure: OPEN REDUCTION INTERNAL (ORIF) RIGHT PERI  PATELLA FRACTURE;  Surgeon: 2004, MD;  Location: [facility];  Service: Orthopedics;  Laterality: Right;   TOTAL KNEE ARTHROPLASTY Bilateral 09/17/2013   Procedure: TOTAL KNEE BILATERAL;  Surgeon: Ollen Gross, MD;  Location: [facility];  Service: Orthopedics;  Laterality: Bilateral;    Home Medications:  Allergies as of 08/19/2021   No Known Allergies      Medication List        Accurate as of August 19, 2021  9:10 AM. If you have any questions, ask your nurse or doctor.          aspirin 81 MG tablet Take 81 mg by mouth daily.   ASPIRIN 81  PO   atomoxetine 80 MG capsule Commonly known as: STRATTERA Take 80 mg by mouth every morning.   finasteride 5 MG tablet Commonly known as: PROSCAR Take 1 tablet (5 mg total) by mouth daily.   hydrochlorothiazide 12.5 MG capsule Commonly known as: MICROZIDE Take 12.5 mg by mouth every morning.   HYDROcodone-acetaminophen 5-325 MG tablet Commonly known as: Norco Take 1-2 tablets by mouth every 4 (four) hours as needed for moderate pain.   KRILL OIL OMEGA-3 PO Maximum Red Krill Omega-3   losartan 50 MG tablet Commonly known as: COZAAR Take 50 mg by mouth every morning.   methocarbamol 500 MG tablet Commonly known as: Robaxin Take 1 tablet (500 mg total) by mouth 4 (four) times daily. As needed for muscle spasm   molnupiravir EUA 200 MG Caps capsule Commonly known as: LAGEVRIO Take 4 capsule by mouth twice a day for 5 days. (Take 4 capsule by mouth twice a day for 5 days.)   NON FORMULARY Take 1 capsule by mouth daily. Mega red   omeprazole 20 MG capsule Commonly known as: PRILOSEC Take 20 mg by mouth every morning.   silodosin 8 MG Caps capsule Commonly known as:  RAPAFLO Take 1 capsule (8 mg total) by mouth daily with breakfast.   simvastatin 40 MG tablet Commonly known as: ZOCOR Take 40 mg by mouth at bedtime.   VITAMIN D PO Vitamin D        Allergies: No Known Allergies  Family History: No family history on file.  Social History:  reports that he has never smoked. He has never used smokeless tobacco. He reports current alcohol use of about 1.0 standard drink of alcohol per week. He reports that he does not use drugs.  ROS: All other review of systems were reviewed and are negative except what is noted above in HPI  Physical Exam: BP 135/81   Pulse 79   Ht 5' 10.5" (1.791 m)   Wt 230 lb (104.3 kg)   BMI 32.54 kg/m   Constitutional:  Alert and oriented, No acute distress. HEENT: [place] AT, moist mucus membranes.  Trachea midline, no  masses. Cardiovascular: No clubbing, cyanosis, or edema. Respiratory: Normal respiratory effort, no increased work of breathing. GI: Abdomen is soft, nontender, nondistended, no abdominal masses GU: No CVA tenderness.  Lymph: No cervical or inguinal lymphadenopathy. Skin: No rashes, bruises or suspicious lesions. Neurologic: Grossly intact, no focal deficits, moving all 4 extremities. Psychiatric: Normal mood and affect.  Laboratory Data: Lab Results  Component Value Date   WBC 5.6 09/09/2014   HGB 16.7 04/07/2015   HCT 49.0 04/07/2015   MCV 90.7 09/09/2014   PLT 191 09/09/2014    Lab Results  Component Value Date   CREATININE 0.70 04/07/2015    No results found for: "PSA"  No results found for: "TESTOSTERONE"  No results found for: "HGBA1C"  Urinalysis    Component Value Date/Time   COLORURINE YELLOW 09/05/2013 0805   APPEARANCEUR Clear 08/06/2020 1451   LABSPEC 1.021 09/05/2013 0805   PHURINE 6.5 09/05/2013 0805   GLUCOSEU Negative 08/06/2020 1451   HGBUR NEGATIVE 09/05/2013 0805   BILIRUBINUR Negative 08/06/2020 1451   KETONESUR NEGATIVE 09/05/2013 0805   PROTEINUR Negative 08/06/2020 1451   PROTEINUR NEGATIVE 09/05/2013 0805   UROBILINOGEN 1.0 09/05/2013 0805   NITRITE Negative 08/06/2020 1451   NITRITE NEGATIVE 09/05/2013 0805   LEUKOCYTESUR Negative 08/06/2020 1451    Lab Results  Component Value Date   LABMICR Comment 08/06/2020   WBCUA None seen 12/10/2019   LABEPIT None seen 12/10/2019   BACTERIA None seen 12/10/2019    Pertinent Imaging:  No results found for this or any previous visit.  No results found for this or any previous visit.  No results found for this or any previous visit.  No results found for this or any previous visit.  No results found for this or any previous visit.  No results found for this or any previous visit.  No results found for this or any previous visit.  No results found for this or any previous  visit.   Assessment & Plan:    1. Gross hematuria resolved - Urinalysis, Routine w reflex microscopic  2. Benign prostatic hyperplasia with urinary obstruction -We discussed the management of his BPH including continued medical therapy, Rezum, Urolift, TURP and simple prostatectomy. After discussing the options the patient has elected to proceed with urolift. Risks/benefits/alternatives discussed.   3. Weak urinary stream Schedule for Urolift   No follow-ups on file.  Kaitlyn Franko, MD  [facility]      Cystoscopy Procedure Note  Patient identification was confirmed, informed consent was obtained, and patient was prepped using   Betadine solution.  Lidocaine jelly was administered per urethral meatus.     Pre-Procedure: - Inspection reveals a normal caliber ureteral meatus.  Procedure: The flexible cystoscope was introduced without difficulty - No urethral strictures/lesions are present. - Enlarged prostate no median lobe - Normal bladder neck - Bilateral ureteral orifices identified - Bladder mucosa  reveals no ulcers, tumors, or lesions - No bladder stones - No trabeculation  Retroflexion shows to intravesical prostatic protrusion   Post-Procedure: - Patient tolerated the procedure well  Assessment/ Plan: We discussed the management of his BPH including continued medical therapy, Rezum, Urolift, TURP and simple prostatectomy. After discussing the options the patient has elected to proceed with Urolift. Risks/benefits/alternatives discussed.   No follow-ups on file.  Wilkie Aye, MD

## 2021-08-19 NOTE — Progress Notes (Addendum)
08/19/2021 9:10 AM   Mitchell Bell 04/15/1960 [id number]  Referring provider: Catha Gosselin, MD [address] [place],  [place] [address]  Hematuria and BPH   HPI: Mr Mitchell Bell is a 60yo here for followup for BPH and gross hematuria. IPSS 6 QOL 1. He is on rapaflo 8mg  and finasteride 5mg . He wishes to proceed with surgery for his BPH because he wants to be off medication.    PMH: Past Medical History:  Diagnosis Date   ADHD (attention deficit hyperactivity disorder)    Anxiety    Arthritis    At risk for sleep apnea    STOP-BANG= 4      SENT TO PCP 09-10-2014   GERD (gastroesophageal reflux disease)    History of chicken pox    History of kidney stones    History of mumps as a child    Hypertension    Right knee pain    s/p  ORIF right patella fx 09-10-2014 w/ retained hardware   Schatzki's ring     per pt    Scoliosis    BACK PAIN    Surgical History: Past Surgical History:  Procedure Laterality Date   HARDWARE REMOVAL Right 04/07/2015   Procedure: HARDWARE REMOVAL AND BONE FRAGMENTS,RIGHT PATELLA;  Surgeon: Ollen Gross, MD;  Location: [facility];  Service: Orthopedics;  Laterality: Right;   INGUINAL HERNIA REPAIR Left age 57   KNEE ARTHROSCOPY Bilateral 2003   ORIF LEFT ANKLE FX  1990   retained hardware   ORIF PATELLA Right 09/10/2014   Procedure: OPEN REDUCTION INTERNAL (ORIF) RIGHT PERI  PATELLA FRACTURE;  Surgeon: Ollen Gross, MD;  Location: [facility];  Service: Orthopedics;  Laterality: Right;   TOTAL KNEE ARTHROPLASTY Bilateral 09/17/2013   Procedure: TOTAL KNEE BILATERAL;  Surgeon: Loanne Drilling, MD;  Location: [facility];  Service: Orthopedics;  Laterality: Bilateral;    Home Medications:  Allergies as of 08/19/2021   No Known Allergies      Medication List        Accurate as of August 19, 2021  9:10 AM. If you have any questions, ask your nurse or doctor.          aspirin 81 MG tablet Take 81 mg by mouth daily.   ASPIRIN 81  PO   atomoxetine 80 MG capsule Commonly known as: STRATTERA Take 80 mg by mouth every morning.   finasteride 5 MG tablet Commonly known as: PROSCAR Take 1 tablet (5 mg total) by mouth daily.   hydrochlorothiazide 12.5 MG capsule Commonly known as: MICROZIDE Take 12.5 mg by mouth every morning.   HYDROcodone-acetaminophen 5-325 MG tablet Commonly known as: Norco Take 1-2 tablets by mouth every 4 (four) hours as needed for moderate pain.   KRILL OIL OMEGA-3 PO Maximum Red Krill Omega-3   losartan 50 MG tablet Commonly known as: COZAAR Take 50 mg by mouth every morning.   methocarbamol 500 MG tablet Commonly known as: Robaxin Take 1 tablet (500 mg total) by mouth 4 (four) times daily. As needed for muscle spasm   molnupiravir EUA 200 MG Caps capsule Commonly known as: LAGEVRIO Take 4 capsule by mouth twice a day for 5 days. (Take 4 capsule by mouth twice a day for 5 days.)   NON FORMULARY Take 1 capsule by mouth daily. Mega red   omeprazole 20 MG capsule Commonly known as: PRILOSEC Take 20 mg by mouth every morning.   silodosin 8 MG Caps capsule Commonly known as:  RAPAFLO Take 1 capsule (8 mg total) by mouth daily with breakfast.   simvastatin 40 MG tablet Commonly known as: ZOCOR Take 40 mg by mouth at bedtime.   VITAMIN D PO Vitamin D        Allergies: No Known Allergies  Family History: No family history on file.  Social History:  reports that he has never smoked. He has never used smokeless tobacco. He reports current alcohol use of about 1.0 standard drink of alcohol per week. He reports that he does not use drugs.  ROS: All other review of systems were reviewed and are negative except what is noted above in HPI  Physical Exam: BP 135/81   Pulse 79   Ht 5' 10.5" (1.791 m)   Wt 230 lb (104.3 kg)   BMI 32.54 kg/m   Constitutional:  Alert and oriented, No acute distress. HEENT: [place] AT, moist mucus membranes.  Trachea midline, no  masses. Cardiovascular: No clubbing, cyanosis, or edema. Respiratory: Normal respiratory effort, no increased work of breathing. GI: Abdomen is soft, nontender, nondistended, no abdominal masses GU: No CVA tenderness. Prostate 40g Lymph: No cervical or inguinal lymphadenopathy. Skin: No rashes, bruises or suspicious lesions. Neurologic: Grossly intact, no focal deficits, moving all 4 extremities. Psychiatric: Normal mood and affect.  Laboratory Data: Lab Results  Component Value Date   WBC 5.6 09/09/2014   HGB 16.7 04/07/2015   HCT 49.0 04/07/2015   MCV 90.7 09/09/2014   PLT 191 09/09/2014    Lab Results  Component Value Date   CREATININE 0.70 04/07/2015    No results found for: "PSA"  No results found for: "TESTOSTERONE"  No results found for: "HGBA1C"  Urinalysis    Component Value Date/Time   COLORURINE YELLOW 09/05/2013 0805   APPEARANCEUR Clear 08/06/2020 1451   LABSPEC 1.021 09/05/2013 0805   PHURINE 6.5 09/05/2013 0805   GLUCOSEU Negative 08/06/2020 1451   HGBUR NEGATIVE 09/05/2013 0805   BILIRUBINUR Negative 08/06/2020 1451   KETONESUR NEGATIVE 09/05/2013 0805   PROTEINUR Negative 08/06/2020 1451   PROTEINUR NEGATIVE 09/05/2013 0805   UROBILINOGEN 1.0 09/05/2013 0805   NITRITE Negative 08/06/2020 1451   NITRITE NEGATIVE 09/05/2013 0805   LEUKOCYTESUR Negative 08/06/2020 1451    Lab Results  Component Value Date   LABMICR Comment 08/06/2020   WBCUA None seen 12/10/2019   LABEPIT None seen 12/10/2019   BACTERIA None seen 12/10/2019    Pertinent Imaging:  No results found for this or any previous visit.  No results found for this or any previous visit.  No results found for this or any previous visit.  No results found for this or any previous visit.  No results found for this or any previous visit.  No results found for this or any previous visit.  No results found for this or any previous visit.  No results found for this or any  previous visit.   Assessment & Plan:    1. Gross hematuria resolved - Urinalysis, Routine w reflex microscopic  2. Benign prostatic hyperplasia with urinary obstruction -We discussed the management of his BPH including continued medical therapy, Rezum, Urolift, TURP and simple prostatectomy. After discussing the options the patient has elected to proceed with urolift. Risks/benefits/alternatives discussed.   3. Weak urinary stream Schedule for Urolift   No follow-ups on file.  Wilkie Aye, MD  [facility] [place]      Cystoscopy Procedure Note  Patient identification was confirmed, informed consent was obtained, and patient was prepped  using Betadine solution.  Lidocaine jelly was administered per urethral meatus.     Pre-Procedure: - Inspection reveals a normal caliber ureteral meatus.  Procedure: The flexible cystoscope was introduced without difficulty - No urethral strictures/lesions are present. - Enlarged prostate no median lobe - Normal bladder neck - Bilateral ureteral orifices identified - Bladder mucosa  reveals no ulcers, tumors, or lesions - No bladder stones - No trabeculation  Retroflexion shows to intravesical prostatic protrusion   Post-Procedure: - Patient tolerated the procedure well  Assessment/ Plan: We discussed the management of his BPH including continued medical therapy, Rezum, Urolift, TURP and simple prostatectomy. After discussing the options the patient has elected to proceed with Urolift. Risks/benefits/alternatives discussed.   No follow-ups on file.  Wilkie Aye, MD

## 2021-08-20 LAB — PSA: Prostate Specific Ag, Serum: 0.9 ng/mL (ref 0.0–4.0)

## 2021-08-21 ENCOUNTER — Other Ambulatory Visit: Payer: Self-pay | Admitting: Urology

## 2021-08-22 ENCOUNTER — Ambulatory Visit: Payer: No Typology Code available for payment source | Admitting: Urology

## 2021-08-24 ENCOUNTER — Other Ambulatory Visit: Payer: Self-pay | Admitting: Urology

## 2021-08-24 DIAGNOSIS — N138 Other obstructive and reflux uropathy: Secondary | ICD-10-CM

## 2021-08-28 ENCOUNTER — Encounter: Payer: Self-pay | Admitting: Urology

## 2021-08-28 NOTE — Patient Instructions (Signed)
Prostatic Urethral Lift  Prostatic urethral lift is a surgical procedure to treat symptoms of prostate gland enlargement that occurs with age (benign prostatic hypertrophy, BPH). The urethra passes between the two lobes of the prostate. The urethra is the part of the body that drains urine from the bladder. As the prostate enlarges, it can push on the urethra and cause problems with urinating. This procedure involves placing an implant that holds the prostate away from the urethra. The procedure is done using a thin device called a cystoscope. The device is inserted through the tip of the penis and moved up the urethra to the prostate. This is less invasive than other procedures that require an incision. You may have this procedure if: You have symptoms of BPH. Your prostate is not severely enlarged. Medicines to treat BPH are not working or not tolerated. You want to avoid possible sexual side effects from medicines or other procedures that are used to treat BPH. Tell a health care provider about: Any allergies you have. All medicines you are taking, including vitamins, herbs, eye drops, creams, and over-the-counter medicines. Any problems you or family members have had with anesthetic medicines. Any bleeding problems you have. Any surgeries you have had. Any medical conditions you have. What are the risks? Generally, this is a safe procedure. However, problems may occur, including: Bleeding. Infection. Leaking of urine (incontinence). Allergic reactions to medicines. Return of BPH symptoms after 2 years, requiring more treatment. What happens before the procedure? When to stop eating and drinking Follow instructions from your health care provider about what you may eat and drink before your procedure. These may include: 8 hours before your procedure Stop eating most foods. Do not eat meat, fried foods, or fatty foods. Eat only light foods, such as toast or crackers. All liquids are  okay except energy drinks and alcohol. 6 hours before your procedure Stop eating. Drink only clear liquids, such as water, clear fruit juice, black coffee, plain tea, and sports drinks. Do not drink energy drinks or alcohol. 2 hours before your procedure Stop drinking all liquids. You may be allowed to take medicines with small sips of water. If you do not follow your health care provider's instructions, your procedure may be delayed or canceled. Medicines Ask your health care provider about: Changing or stopping your regular medicines. This is especially important if you are taking diabetes medicines or blood thinners. Taking medicines such as aspirin and ibuprofen. These medicines can thin your blood. Do not take these medicines unless your health care provider tells you to take them. Taking over-the-counter medicines, vitamins, herbs, and supplements. Surgery safety Ask your health care provider what steps will be taken to help prevent infection. These steps may include: Removing hair at the surgery site. Washing skin with a germ-killing soap. Taking antibiotic medicine. General instructions Do not use any products that contain nicotine or tobacco for at least 4 weeks before the procedure. These products include cigarettes, chewing tobacco, and vaping devices, such as e-cigarettes. If you need help quitting, ask your health care provider. If you will be going home right after the procedure, plan to have a responsible adult: Take you home from the hospital or clinic. You will not be allowed to drive. Care for you for the time you are told. What happens during the procedure? An IV may be inserted into one of your veins. You will be given one or more of the following: A medicine to help you relax (sedative). A medicine that   is injected into your urethra to numb the area (local anesthetic). A medicine to make you fall asleep (general anesthetic). A cystoscope will be inserted into your  penis and moved through your urethra to your prostate. A device will be inserted through the cystoscope and used to press the lobes of your prostate away from your urethra. Implants will be inserted through the device to hold the lobes of your prostate in the widened position. The device and cystoscope will be removed. The procedure may vary among health care providers and hospitals. What happens after the procedure? Your blood pressure, heart rate, breathing rate, and blood oxygen level be monitored until you leave the hospital or clinic. If you were given a sedative during the procedure, it can affect you for several hours. Do not drive or operate machinery until your health care provider says that it is safe. Summary Prostatic urethral lift is a surgical procedure to relieve symptoms of prostate gland enlargement that occurs with age (benign prostatic hypertrophy, BPH). The procedure is performed with a thin device called a cystoscope. This device is inserted through the tip of the penis and moved up the urethra to reach the prostate. This is less invasive than other procedures that require an incision. If you will be going home right after the procedure, plan to have a responsible adult take you home from the hospital or clinic. You will not be allowed to drive. This information is not intended to replace advice given to you by your health care provider. Make sure you discuss any questions you have with your health care provider. Document Revised: 08/06/2020 Document Reviewed: 08/06/2020 Elsevier Patient Education  2023 [employer]  

## 2021-08-29 ENCOUNTER — Other Ambulatory Visit: Payer: Self-pay

## 2021-08-29 ENCOUNTER — Telehealth: Payer: Self-pay

## 2021-08-29 NOTE — Telephone Encounter (Signed)
Spoke with patient and want to know if insurance will cover his urolift and refill for medication finasteride (PROSCAR) 5 MG tablet

## 2021-08-31 ENCOUNTER — Telehealth: Payer: Self-pay

## 2021-08-31 NOTE — Telephone Encounter (Signed)
I spoke with Mr. Mitchell Bell. We have discussed possible surgery dates and 09/08/2021 was agreed upon by all parties. Patient given information about surgery date, what to expect pre-operatively and post operatively.    We discussed that a pre-op nurse will be calling to set up the pre-op visit that will take place prior to surgery. Informed patient that our office will communicate any additional care to be provided after surgery.    Patients questions or concerns were discussed during our call. Advised to call our office should there be any additional information, questions or concerns that arise. Patient verbalized understanding.      [employer] PA started and faxed to [phone]

## 2021-09-01 ENCOUNTER — Other Ambulatory Visit: Payer: Self-pay

## 2021-09-01 DIAGNOSIS — N138 Other obstructive and reflux uropathy: Secondary | ICD-10-CM

## 2021-09-01 MED ORDER — FINASTERIDE 5 MG PO TABS: 5.0000 mg | ORAL_TABLET | Freq: Every day | ORAL | 0 refills | Status: DC

## 2021-09-01 NOTE — Progress Notes (Signed)
Rx refill for 30 days

## 2021-09-02 NOTE — Telephone Encounter (Signed)
Refil sent, patient aware.

## 2021-09-05 NOTE — Patient Instructions (Signed)
Mitchell Bell  09/05/2021     @PREFPERIOPPHARMACY @   Your procedure is scheduled on  09/08/2021.   Report to [facility] at  0600  A.M.   Call this number if you have problems the morning of surgery:  [phone]   Remember:  Do not eat or drink after midnight.      Take these medicines the morning of surgery with A SIP OF WATER                                 proscar, prilosec, rapaflo.     Do not wear jewelry, make-up or nail polish.  Do not wear lotions, powders, or perfumes, or deodorant.  Do not shave 48 hours prior to surgery.  Men may shave face and neck.  Do not bring valuables to the hospital.  [facility] is not responsible for any belongings or valuables.  Contacts, dentures or bridgework may not be worn into surgery.  Leave your suitcase in the car.  After surgery it may be brought to your room.  For patients admitted to the hospital, discharge time will be determined by your treatment team.  Patients discharged the day of surgery will not be allowed to drive home and must have someone with them for 24 hours.    Special instructions:   DO NOT smoke tobacco or vape for 24 hours before your procedure.  Please read over the following fact sheets that you were given. Anesthesia Post-op Instructions and Care and Recovery After Surgery      Prostatic Urethral Lift, Care After The following information offers guidance on how to care for yourself after your procedure. Your health care provider may also give you more specific instructions. If you have problems or questions, contact your health care provider. What can I expect after the procedure? After the procedure, it is common to have: Soreness or discomfort in your penis from having the cystoscope inserted during the procedure. Discomfort or burning when urinating. An increased urge to urinate. More frequent urination. Urine that is blood-tinged. These symptoms should go away after a few  days. Follow these instructions at home: Activity  If you were given a sedative during the procedure, it can affect you for several hours. Do not drive or operate machinery until your health care provider says that it is safe. Avoid sitting for a long time without moving. Get up to take short walks every 1-2 hours. This is important to improve blood flow and breathing. Ask for help if you feel weak or unsteady. You may have to avoid lifting. Ask your health care provider how much you can safely lift. Avoid intense physical activity for as long as told by your health care provider. Return to your normal activities as told by your health care provider. Ask your health care provider what activities are safe for you. Ask when you can return to sexual activity. General instructions  Take over-the-counter and prescription medicines only as told by your health care provider. Ask your health care provider if the medicine prescribed to you: Requires you to avoid driving or using machinery. Can cause constipation. You may need to take these actions to prevent or treat constipation: Drink enough fluid to keep your urine pale yellow. Take over-the-counter or prescription medicines. Eat foods that are high in fiber, such as beans, whole grains, and fresh fruits and vegetables. Limit foods that  are high in fat and processed sugars, such as fried or sweet foods. Do not use any products that contain nicotine or tobacco. These products include cigarettes, chewing tobacco, and vaping devices, such as e-cigarettes. These can delay healing after the procedure. If you need help quitting, ask your health care provider. Keep all follow-up visits. This is important. Contact a health care provider if: You have chills or a fever. You have pain when passing urine. You have bright red blood or blood clots in your urine. You have difficulty passing urine. You have leaking of urine (incontinence). Get help right away  if: You have chest pain or shortness of breath. You have leg pain or swelling. You cannot pass urine. These symptoms may be an emergency. Get help right away. Call 911. Do not wait to see if the symptoms will go away. Do not drive yourself to the hospital. Summary After the procedure, it is common to have discomfort or burning when urinating, an increased urge to urinate, more frequent urination, and urine that is blood-tinged. You may have to avoid lifting. Ask your health care provider how much you can safely lift. Return to your normal activities as told by your health care provider. Ask when you can return to sexual activity. This information is not intended to replace advice given to you by your health care provider. Make sure you discuss any questions you have with your health care provider. Document Revised: 08/06/2020 Document Reviewed: 08/06/2020 Elsevier Patient Education  2023 [employer] General Anesthesia, Adult, Care After The following information offers guidance on how to care for yourself after your procedure. Your health care provider may also give you more specific instructions. If you have problems or questions, contact your health care provider. What can I expect after the procedure? After the procedure, it is common for people to: Have pain or discomfort at the IV site. Have nausea or vomiting. Have a sore throat or hoarseness. Have trouble concentrating. Feel cold or chills. Feel weak, sleepy, or tired (fatigue). Have soreness and body aches. These can affect parts of the body that were not involved in surgery. Follow these instructions at home: For the time period you were told by your health care provider:  Rest. Do not participate in activities where you could fall or become injured. Do not drive or use machinery. Do not drink alcohol. Do not take sleeping pills or medicines that cause drowsiness. Do not make important decisions or sign legal  documents. Do not take care of children on your own. General instructions Drink enough fluid to keep your urine pale yellow. If you have sleep apnea, surgery and certain medicines can increase your risk for breathing problems. Follow instructions from your health care provider about wearing your sleep device: Anytime you are sleeping, including during daytime naps. While taking prescription pain medicines, sleeping medicines, or medicines that make you drowsy. Return to your normal activities as told by your health care provider. Ask your health care provider what activities are safe for you. Take over-the-counter and prescription medicines only as told by your health care provider. Do not use any products that contain nicotine or tobacco. These products include cigarettes, chewing tobacco, and vaping devices, such as e-cigarettes. These can delay incision healing after surgery. If you need help quitting, ask your health care provider. Contact a health care provider if: You have nausea or vomiting that does not get better with medicine. You vomit every time you eat or drink. You have pain that  does not get better with medicine. You cannot urinate or have bloody urine. You develop a skin rash. You have a fever. Get help right away if: You have trouble breathing. You have chest pain. You vomit blood. These symptoms may be an emergency. Get help right away. Call 911. Do not wait to see if the symptoms will go away. Do not drive yourself to the hospital. Summary After the procedure, it is common to have a sore throat, hoarseness, nausea, vomiting, or to feel weak, sleepy, or fatigue. For the time period you were told by your health care provider, do not drive or use machinery. Get help right away if you have difficulty breathing, have chest pain, or vomit blood. These symptoms may be an emergency. This information is not intended to replace advice given to you by your health care provider.  Make sure you discuss any questions you have with your health care provider. Document Revised: 04/08/2021 Document Reviewed: 04/08/2021 Elsevier Patient Education  2023 [employer].

## 2021-09-06 ENCOUNTER — Encounter (HOSPITAL_COMMUNITY): Payer: Self-pay

## 2021-09-06 ENCOUNTER — Other Ambulatory Visit: Payer: Self-pay

## 2021-09-06 ENCOUNTER — Encounter (HOSPITAL_COMMUNITY)
Admission: RE | Admit: 2021-09-06 | Discharge: 2021-09-06 | Disposition: A | Discharge status: Home / Self Care | Payer: BC Managed Care – PPO | Source: Ambulatory Visit | Attending: Urology | Admitting: Urology

## 2021-09-06 VITALS — BP 135/88 | HR 66 | Temp 97.7°F | Resp 18 | Ht 70.5 in | Wt 230.0 lb

## 2021-09-06 DIAGNOSIS — M1990 Unspecified osteoarthritis, unspecified site: Secondary | ICD-10-CM | POA: Diagnosis not present

## 2021-09-06 DIAGNOSIS — Z79899 Other long term (current) drug therapy: Secondary | ICD-10-CM | POA: Diagnosis not present

## 2021-09-06 DIAGNOSIS — I10 Essential (primary) hypertension: Secondary | ICD-10-CM | POA: Insufficient documentation

## 2021-09-06 DIAGNOSIS — N401 Enlarged prostate with lower urinary tract symptoms: Secondary | ICD-10-CM | POA: Diagnosis not present

## 2021-09-06 DIAGNOSIS — R3912 Poor urinary stream: Secondary | ICD-10-CM | POA: Diagnosis not present

## 2021-09-06 DIAGNOSIS — Z01818 Encounter for other preprocedural examination: Secondary | ICD-10-CM | POA: Insufficient documentation

## 2021-09-06 DIAGNOSIS — K219 Gastro-esophageal reflux disease without esophagitis: Secondary | ICD-10-CM | POA: Diagnosis not present

## 2021-09-06 DIAGNOSIS — F909 Attention-deficit hyperactivity disorder, unspecified type: Secondary | ICD-10-CM | POA: Diagnosis not present

## 2021-09-06 DIAGNOSIS — N138 Other obstructive and reflux uropathy: Secondary | ICD-10-CM | POA: Diagnosis not present

## 2021-09-06 LAB — BASIC METABOLIC PANEL
Anion gap: 6 (ref 5–15)
BUN: 11 mg/dL (ref 6–20)
CO2: 26 mmol/L (ref 22–32)
Calcium: 9 mg/dL (ref 8.9–10.3)
Chloride: 106 mmol/L (ref 98–111)
Creatinine, Ser: 0.7 mg/dL (ref 0.61–1.24)
GFR, Estimated: >60 mL/min (ref >60)
Glucose, Bld: 112 mg/dL — ABNORMAL HIGH (ref 70–99)
Potassium: 3.4 mmol/L — ABNORMAL LOW (ref 3.5–5.1)
Sodium: 138 mmol/L (ref 135–145)

## 2021-09-08 ENCOUNTER — Ambulatory Visit (HOSPITAL_COMMUNITY): Payer: BC Managed Care – PPO | Admitting: Certified Registered"

## 2021-09-08 ENCOUNTER — Ambulatory Visit (HOSPITAL_COMMUNITY)
Admission: RE | Admit: 2021-09-08 | Discharge: 2021-09-08 | Disposition: A | Discharge status: Home / Self Care | Payer: BC Managed Care – PPO | Attending: Urology | Admitting: Urology

## 2021-09-08 ENCOUNTER — Encounter (HOSPITAL_COMMUNITY): Payer: Self-pay | Admitting: Urology

## 2021-09-08 ENCOUNTER — Encounter (HOSPITAL_COMMUNITY)
Admission: RE | Disposition: A | Discharge status: Home / Self Care | Payer: Self-pay | Source: Home / Self Care | Attending: Urology

## 2021-09-08 DIAGNOSIS — K219 Gastro-esophageal reflux disease without esophagitis: Secondary | ICD-10-CM | POA: Insufficient documentation

## 2021-09-08 DIAGNOSIS — F909 Attention-deficit hyperactivity disorder, unspecified type: Secondary | ICD-10-CM | POA: Insufficient documentation

## 2021-09-08 DIAGNOSIS — M1990 Unspecified osteoarthritis, unspecified site: Secondary | ICD-10-CM | POA: Insufficient documentation

## 2021-09-08 DIAGNOSIS — N138 Other obstructive and reflux uropathy: Secondary | ICD-10-CM | POA: Insufficient documentation

## 2021-09-08 DIAGNOSIS — N401 Enlarged prostate with lower urinary tract symptoms: Secondary | ICD-10-CM | POA: Diagnosis not present

## 2021-09-08 DIAGNOSIS — Z79899 Other long term (current) drug therapy: Secondary | ICD-10-CM | POA: Diagnosis not present

## 2021-09-08 DIAGNOSIS — I10 Essential (primary) hypertension: Secondary | ICD-10-CM | POA: Diagnosis not present

## 2021-09-08 DIAGNOSIS — R3912 Poor urinary stream: Secondary | ICD-10-CM | POA: Diagnosis not present

## 2021-09-08 HISTORY — PX: CYSTOSCOPY WITH INSERTION OF UROLIFT: SHX6678

## 2021-09-08 SURGERY — CYSTOSCOPY WITH INSERTION OF UROLIFT
Anesthesia: General | Site: Prostate

## 2021-09-08 MED ORDER — FENTANYL CITRATE (PF) 100 MCG/2ML IJ SOLN
INTRAMUSCULAR | Status: AC
  Filled 2021-09-08: qty 2

## 2021-09-08 MED ORDER — MIDAZOLAM HCL 2 MG/2ML IJ SOLN
INTRAMUSCULAR | Status: AC
  Filled 2021-09-08: qty 2

## 2021-09-08 MED ORDER — ONDANSETRON HCL 4 MG/2ML IJ SOLN: 4.0000 mg | Freq: Once | INTRAMUSCULAR | Status: DC | PRN

## 2021-09-08 MED ORDER — LIDOCAINE HCL URETHRAL/MUCOSAL 2 % EX GEL
CUTANEOUS | Status: AC
  Filled 2021-09-08: qty 10

## 2021-09-08 MED ORDER — WATER FOR IRRIGATION, STERILE IR SOLN
Status: DC | PRN
  Administered 2021-09-08: 3000 mL

## 2021-09-08 MED ORDER — TRAMADOL HCL 50 MG PO TABS: 50.0000 mg | ORAL_TABLET | Freq: Four times a day (QID) | ORAL | 0 refills | Status: DC | PRN

## 2021-09-08 MED ORDER — LACTATED RINGERS IV SOLN: INTRAVENOUS | Status: DC

## 2021-09-08 MED ORDER — MEPERIDINE HCL 50 MG/ML IJ SOLN: 6.2500 mg | INTRAMUSCULAR | Status: DC | PRN

## 2021-09-08 MED ORDER — DEXAMETHASONE SODIUM PHOSPHATE 10 MG/ML IJ SOLN
INTRAMUSCULAR | Status: AC
  Filled 2021-09-08: qty 1

## 2021-09-08 MED ORDER — ONDANSETRON HCL 4 MG/2ML IJ SOLN
INTRAMUSCULAR | Status: DC | PRN
  Administered 2021-09-08: 4 mg via INTRAVENOUS

## 2021-09-08 MED ORDER — PROPOFOL 10 MG/ML IV BOLUS
INTRAVENOUS | Status: DC | PRN
  Administered 2021-09-08: 200 mg via INTRAVENOUS

## 2021-09-08 MED ORDER — ONDANSETRON HCL 4 MG/2ML IJ SOLN
INTRAMUSCULAR | Status: AC
  Filled 2021-09-08: qty 2

## 2021-09-08 MED ORDER — DEXAMETHASONE SODIUM PHOSPHATE 10 MG/ML IJ SOLN
INTRAMUSCULAR | Status: DC | PRN
  Administered 2021-09-08: 10 mg via INTRAVENOUS

## 2021-09-08 MED ORDER — LIDOCAINE HCL (CARDIAC) PF 100 MG/5ML IV SOSY
PREFILLED_SYRINGE | INTRAVENOUS | Status: DC | PRN
  Administered 2021-09-08: 60 mg via INTRATRACHEAL

## 2021-09-08 MED ORDER — CEFAZOLIN SODIUM-DEXTROSE 2-4 GM/100ML-% IV SOLN
2.0000 g | INTRAVENOUS | Status: AC
  Administered 2021-09-08: 2 g via INTRAVENOUS
  Filled 2021-09-08: qty 100

## 2021-09-08 MED ORDER — MIDAZOLAM HCL 5 MG/5ML IJ SOLN
INTRAMUSCULAR | Status: DC | PRN
  Administered 2021-09-08: 2 mg via INTRAVENOUS

## 2021-09-08 MED ORDER — LIDOCAINE HCL (PF) 2 % IJ SOLN
INTRAMUSCULAR | Status: AC
  Filled 2021-09-08: qty 5

## 2021-09-08 MED ORDER — ORAL CARE MOUTH RINSE: 15.0000 mL | Freq: Once | OROMUCOSAL | Status: AC

## 2021-09-08 MED ORDER — CHLORHEXIDINE GLUCONATE 0.12 % MT SOLN
15.0000 mL | Freq: Once | OROMUCOSAL | Status: AC
  Administered 2021-09-08: 15 mL via OROMUCOSAL

## 2021-09-08 MED ORDER — HYDROMORPHONE HCL 1 MG/ML IJ SOLN: 0.2500 mg | INTRAMUSCULAR | Status: DC | PRN

## 2021-09-08 MED ORDER — FENTANYL CITRATE (PF) 100 MCG/2ML IJ SOLN
INTRAMUSCULAR | Status: DC | PRN
  Administered 2021-09-08 (×2): 50 ug via INTRAVENOUS

## 2021-09-08 SURGICAL SUPPLY — 19 items
BAG DRAIN URO TABLE W/ADPT NS (BAG) ×1 IMPLANT
BAG DRN 8 ADPR NS SKTRN CSTL (BAG) ×1
BAG HAMPER (MISCELLANEOUS) ×1 IMPLANT
CLOTH BEACON ORANGE TIMEOUT ST (SAFETY) ×1 IMPLANT
GLOVE BIO SURGEON STRL SZ8 (GLOVE) ×1 IMPLANT
GLOVE BIOGEL PI IND STRL 7.0 (GLOVE) ×2 IMPLANT
GLOVE BIOGEL PI INDICATOR 7.0 (GLOVE) ×2
GOWN STRL REUS W/TWL LRG LVL3 (GOWN DISPOSABLE) ×1 IMPLANT
GOWN STRL REUS W/TWL XL LVL3 (GOWN DISPOSABLE) ×1 IMPLANT
KIT TURNOVER CYSTO (KITS) ×1 IMPLANT
MANIFOLD NEPTUNE II (INSTRUMENTS) ×1 IMPLANT
PACK CYSTO (CUSTOM PROCEDURE TRAY) ×1 IMPLANT
PAD ARMBOARD 7.5X6 YLW CONV (MISCELLANEOUS) ×1 IMPLANT
SYSTEM UROLIFT (Male Continence) IMPLANT
TOWEL OR 17X26 4PK STRL BLUE (TOWEL DISPOSABLE) ×1 IMPLANT
TRAY FOLEY W/BAG SLVR 16FR (SET/KITS/TRAYS/PACK) ×1
TRAY FOLEY W/BAG SLVR 16FR ST (SET/KITS/TRAYS/PACK) ×1 IMPLANT
WATER STERILE IRR 3000ML UROMA (IV SOLUTION) ×1 IMPLANT
WATER STERILE IRR 500ML POUR (IV SOLUTION) ×1 IMPLANT

## 2021-09-08 NOTE — Anesthesia Postprocedure Evaluation (Signed)
Anesthesia Post Note  Patient: Mitchell Bell  Procedure(s) Performed: CYSTOSCOPY WITH INSERTION OF UROLIFT (Prostate)  Patient location during evaluation: Phase II Anesthesia Type: General Level of consciousness: awake and alert and oriented Pain management: pain level controlled Vital Signs Assessment: post-procedure vital signs reviewed and stable Respiratory status: spontaneous breathing, nonlabored ventilation and respiratory function stable Cardiovascular status: blood pressure returned to baseline and stable Postop Assessment: no apparent nausea or vomiting Anesthetic complications: no   No notable events documented.   Last Vitals:  Vitals:   09/08/21 0900 09/08/21 0906  BP:  127/75  Pulse: (!) 55   Resp: 15   Temp:    SpO2: 97%     Last Pain:  Vitals:   09/08/21 0906  TempSrc:   PainSc: 2                  Osborn Pullin C Charon Smedberg

## 2021-09-08 NOTE — Anesthesia Procedure Notes (Signed)
Procedure Name: LMA Insertion Date/Time: 09/08/2021 7:46 AM  Performed by: Caren Macadam, CRNAPre-anesthesia Checklist: Patient identified, Emergency Drugs available, Suction available and Patient being monitored Patient Re-evaluated:Patient Re-evaluated prior to induction Oxygen Delivery Method: Circle system utilized Preoxygenation: Pre-oxygenation with 100% oxygen Induction Type: IV induction Ventilation: Mask ventilation without difficulty LMA: LMA inserted LMA Size: 5.0 Number of attempts: 1 Placement Confirmation: positive ETCO2 and breath sounds checked- equal and bilateral Tube secured with: Tape Dental Injury: Teeth and Oropharynx as per pre-operative assessment

## 2021-09-08 NOTE — Anesthesia Preprocedure Evaluation (Signed)
Anesthesia Evaluation  Patient identified by MRN, date of birth, ID band Patient awake    Reviewed: Allergy & Precautions, NPO status , Patient's Chart, lab work & pertinent test results  Airway Mallampati: II  TM Distance: >3 FB Neck ROM: Full    Dental  (+) Dental Advisory Given, Teeth Intact   Pulmonary neg pulmonary ROS,    Pulmonary exam normal breath sounds clear to auscultation       Cardiovascular hypertension, Pt. on medications Normal cardiovascular exam Rhythm:Regular Rate:Normal     Neuro/Psych PSYCHIATRIC DISORDERS Anxiety negative neurological ROS     GI/Hepatic Neg liver ROS, GERD  Medicated,  Endo/Other  negative endocrine ROS  Renal/GU negative Renal ROS  negative genitourinary   Musculoskeletal  (+) Arthritis , Osteoarthritis,    Abdominal   Peds negative pediatric ROS (+) ADHD Hematology negative hematology ROS (+)   Anesthesia Other Findings   Reproductive/Obstetrics negative OB ROS                           Anesthesia Physical Anesthesia Plan  ASA: 2  Anesthesia Plan: General   Post-op Pain Management: Minimal or no pain anticipated   Induction: Intravenous  PONV Risk Score and Plan: 4 or greater and Ondansetron and Dexamethasone  Airway Management Planned: LMA  Additional Equipment:   Intra-op Plan:   Post-operative Plan: Extubation in OR  Informed Consent: I have reviewed the patients History and Physical, chart, labs and discussed the procedure including the risks, benefits and alternatives for the proposed anesthesia with the patient or authorized representative who has indicated his/her understanding and acceptance.     Dental advisory given  Plan Discussed with: CRNA and Surgeon  Anesthesia Plan Comments:        Anesthesia Quick Evaluation

## 2021-09-08 NOTE — Op Note (Signed)
   PREOPERATIVE DIAGNOSIS:  Benign prostatic hypertrophy with bladder outlet obstruction.  POSTOPERATIVE DIAGNOSIS:  Benign prostatic hypertrophy with bladder outlet obstruction.  PROCEDURE:  Cystoscopy with implantation of UroLift devices, 6 implants.  SURGEON:  Wilkie Aye, M.D.  ANESTHESIA:  General  ANTIBIOTICS: ancef  SPECIMEN:  None.  DRAINS:  A 16-French Foley catheter.  BLOOD LOSS:  Minimal.  COMPLICATIONS:  None.  INDICATIONS: The Patient is an 61 year old male with BPH and bladder outlet obstruction.  He has failed medical therapy and has elected UroLift for definitive treatment.  FINDINGS OF PROCEDURE:  He was taken to the operating room where a general anesthetic was induced.  He was placed in lithotomy position and was fitted with PAS hose.  His perineum and genitalia were prepped with chlorhexidine, and he was draped in usual sterile fashion.  Cystoscopy was performed using the UroLift scope and 0 degree lens. Examination revealed a normal urethra.  The external sphincter was intact.  Prostatic urethra was approximately 4 cm in length with lateral lobe enlargement. There was also little bit of bladder neck elevation. Inspection of bladder revealed mild-to-moderate trabeculation with no tumors, stones, or inflammation.  No cellules or diverticula were noted. Ureteral orifices were in their normal anatomic position effluxing clear urine.  After initial cystoscopy, the visual obturator was replaced with the first UroLift device.  This was turned to the 9 o'clock position and pulled back to the veru and then slightly advanced.  Pressure was then applied to the right lateral lobe and the UroLift device was deployed.  The second UroLift device was then inserted and applied to the left lateral lobe at 3 o'clock and deployed in the mid prostatic urethra. After this, there was still some apparent obstruction closer to the bladder neck.  So a second and third  level of UroLift your left device was applied between the mid urethra and the proximal urethra providing further patency to the prostatic urethra.  At this point, there was mild bleeding but the patient did have a spinal anesthetic.  So it was thought that a Foley catheter was indicated.  The scope was removed and a 16-French Foley catheter was inserted without difficulty. The balloon was filled with 10 mL sterile fluid, and the catheter was placed to straight drainage.  COMPLICATIONS: None   CONDITION: Stable, extubated, transferred to PACU  PLAN: The patient will be discharged home and followup in 2 days for a voiding trial.

## 2021-09-08 NOTE — Interval H&P Note (Signed)
History and Physical Interval Note:  09/08/2021 7:30 AM  Mitchell Bell  has presented today for surgery, with the diagnosis of Benign Prostatic Hyplasia.  The various methods of treatment have been discussed with the patient and family. After consideration of risks, benefits and other options for treatment, the patient has consented to  Procedure(s): CYSTOSCOPY WITH INSERTION OF UROLIFT (N/A) as a surgical intervention.  The patient's history has been reviewed, patient examined, no change in status, stable for surgery.  I have reviewed the patient's chart and labs.  Questions were answered to the patient's satisfaction.     Wilkie Aye

## 2021-09-08 NOTE — Transfer of Care (Signed)
Immediate Anesthesia Transfer of Care Note  Patient: RANEY KOEPPEN  Procedure(s) Performed: CYSTOSCOPY WITH INSERTION OF UROLIFT (Prostate)  Patient Location: PACU  Anesthesia Type:General  Level of Consciousness: awake  Airway & Oxygen Therapy: Patient Spontanous Breathing and Patient connected to nasal cannula oxygen  Post-op Assessment: Report given to RN and Post -op Vital signs reviewed and stable  Post vital signs: Reviewed and stable  Last Vitals:  Vitals Value Taken Time  BP    Temp    Pulse 65 09/08/21 0811  Resp 12 09/08/21 0811  SpO2 94 % 09/08/21 0811  Vitals shown include unvalidated device data.  Last Pain:  Vitals:   09/08/21 0653  TempSrc: Oral  PainSc: 0-No pain      Patients Stated Pain Goal: 5 (09/08/21 8832)  Complications: No notable events documented.

## 2021-09-10 ENCOUNTER — Other Ambulatory Visit: Payer: Self-pay

## 2021-09-10 ENCOUNTER — Emergency Department (HOSPITAL_COMMUNITY)
Admission: EM | Admit: 2021-09-10 | Discharge: 2021-09-10 | Disposition: A | Discharge status: Home / Self Care | Payer: BC Managed Care – PPO | Attending: Emergency Medicine | Admitting: Emergency Medicine

## 2021-09-10 ENCOUNTER — Encounter (HOSPITAL_COMMUNITY): Payer: Self-pay

## 2021-09-10 DIAGNOSIS — T839XXA Unspecified complication of genitourinary prosthetic device, implant and graft, initial encounter: Secondary | ICD-10-CM

## 2021-09-10 DIAGNOSIS — R300 Dysuria: Secondary | ICD-10-CM | POA: Diagnosis not present

## 2021-09-10 LAB — URINALYSIS, ROUTINE W REFLEX MICROSCOPIC
Bilirubin Urine: NEGATIVE
Glucose, UA: NEGATIVE mg/dL
Ketones, ur: NEGATIVE mg/dL
Nitrite: NEGATIVE
Protein, ur: 30 mg/dL — AB
Specific Gravity, Urine: 1.012 (ref 1.005–1.030)
pH: 6 (ref 5.0–8.0)

## 2021-09-10 NOTE — ED Triage Notes (Signed)
Pt states he had a urolift procedure on Thursday. Pt states that he woke up this AM and had a small amount of urine in drainage bag. Pt then was going about his day and noticed no urine had come out. Pt then started experiencing discomfort in lower abd. When pt walked in, he dislodged the blockage and was able to drain bladder. Wife is concerned pt has more clots and may prevent drainage again.

## 2021-09-10 NOTE — ED Provider Notes (Addendum)
[facility] EMERGENCY DEPARTMENT Provider Note   CSN: [id number] Arrival date & time: 09/10/21  1123     History  Chief Complaint  Patient presents with   Catheter Blockage    Mitchell Bell is a 61 y.o. male.  HPI Patient with a history of UroLift procedure due to BPH performed last week now presents after an episode of difficulty with urination.  He is accompanied by his wife who assists with the history.  Additional details are obtained on chart review from OR note with uncomplicated surgical procedure 2 days ago. Where the patient was previously draining urine freely into his collection bag today the patient noticed urine passing around the head of the penis not into the lumen.  In route to the exam room the patient notes that he felt something unusual, and after trying to dislodge it noticed a small piece of particulate material in the catheter.    Home Medications Prior to Admission medications   Medication Sig Start Date End Date Taking? Authorizing Provider  Cholecalciferol (VITAMIN D) 50 MCG (2000 UT) tablet Take 2,000 Units by mouth in the morning and at bedtime.    [provider]  finasteride (PROSCAR) 5 MG tablet TAKE 1 TABLET (5 MG TOTAL) BY MOUTH DAILY. 09/03/21   McKenzie, Mardene Celeste, MD  finasteride (PROSCAR) 5 MG tablet Take 1 tablet (5 mg total) by mouth daily. 09/01/21   McKenzie, Mardene Celeste, MD  hydrochlorothiazide (MICROZIDE) 12.5 MG capsule Take 12.5 mg by mouth every morning.    [provider]  losartan (COZAAR) 50 MG tablet Take 50 mg by mouth every morning.    [provider]  MegaRed Omega-3 Krill Oil 350 MG CAPS Take 350 mg by mouth daily.    [provider]  Multiple Vitamins-Minerals (MULTIVITAMIN MEN 50+ PO) Take 1 tablet by mouth daily.    [provider]  omeprazole (PRILOSEC) 20 MG capsule Take 20 mg by mouth every morning.    [provider]  silodosin (RAPAFLO) 8 MG CAPS capsule TAKE 1 CAPSULE BY  MOUTH DAILY WITH BREAKFAST. 08/22/21   McKenzie, Mardene Celeste, MD  simvastatin (ZOCOR) 40 MG tablet Take 40 mg by mouth at bedtime.    [provider]  traMADol (ULTRAM) 50 MG tablet Take 1 tablet (50 mg total) by mouth every 6 (six) hours as needed. 09/08/21 09/08/22  Malen Gauze, MD      Allergies    Amlodipine besylate    Review of Systems   Review of Systems  All other systems reviewed and are negative.   Physical Exam Updated Vital Signs BP 122/85 (BP Location: Right Arm)   Pulse 68   Temp 98.2 F (36.8 C) (Oral)   Resp 16   Ht 5' 10.5" (1.791 m)   Wt 104.3 kg   SpO2 97%   BMI 32.54 kg/m  Physical Exam Vitals and nursing note reviewed.  Constitutional:      General: He is not in acute distress.    Appearance: He is well-developed.  HENT:     Head: Normocephalic and atraumatic.  Eyes:     Conjunctiva/sclera: Conjunctivae normal.  Pulmonary:     Effort: Pulmonary effort is normal. No respiratory distress.     Breath sounds: No stridor.  Abdominal:     General: There is no distension.     Tenderness: There is no abdominal tenderness. There is no guarding.  Skin:    General: Skin is warm and dry.  Neurological:  Mental Status: He is alert and oriented to person, place, and time.     ED Results / Procedures / Treatments   Labs (all labs ordered are listed, but only abnormal results are displayed) Labs Reviewed  URINE CULTURE  URINALYSIS, ROUTINE W REFLEX MICROSCOPIC    EKG None  Radiology No results found.  Procedures Procedures    Medications Ordered in ED Medications - No data to display  ED Course/ Medical Decision Making/ A&P This patient with a Hx of BPH recent surgery presents to the ED for concern of urinary obstruction, this involves an extensive number of treatment options, and is a complaint that carries with it a high risk of complications and morbidity.    The differential diagnosis includes fracture versus infection,  less likely bacteremia or sepsis   Social Determinants of Health:  No limits  Additional history obtained:  Additional history and/or information obtained from as above wife and or notes   After the initial evaluation, orders, including: Urinalysis were initiated.   On repeat evaluation of the patient stayed the same  Lab Tests:  I personally interpreted labs.  The pertinent results include: No evidence for urinary tract infection   Dispostion / Final MDM:  After consideration of the diagnostic results and the patient's response to treatment, this adult male with recent UroLift procedure due to BPH presents with blockage that resolves largely before arrival, but is recurrent, requiring catheter replacement, irrigation.  No evidence for bacteremia, sepsis.  , Wife and I discussed the nature of post operative catheter management, complications, frustrations, and after this, patient will go home, with close outpatient urology or return to ED precautions.  Final Clinical Impression(s) / ED Diagnoses Final diagnoses:  Complication of Foley catheter, initial encounter [facility])     Gerhard Munch, MD 09/10/21 1540    Gerhard Munch, MD 09/10/21 1601

## 2021-09-10 NOTE — ED Notes (Signed)
Patient reports foley cath continue to leak. Notified EDP

## 2021-09-10 NOTE — Discharge Instructions (Addendum)
Please monitor your condition carefully and follow-up with your urologist on Monday.  Return here for concerning changes in your condition.

## 2021-09-12 ENCOUNTER — Ambulatory Visit (INDEPENDENT_AMBULATORY_CARE_PROVIDER_SITE_OTHER): Payer: BC Managed Care – PPO | Admitting: Physician Assistant

## 2021-09-12 ENCOUNTER — Encounter (HOSPITAL_COMMUNITY): Payer: Self-pay | Admitting: Urology

## 2021-09-12 VITALS — BP 111/79 | HR 96

## 2021-09-12 DIAGNOSIS — N138 Other obstructive and reflux uropathy: Secondary | ICD-10-CM

## 2021-09-12 DIAGNOSIS — N401 Enlarged prostate with lower urinary tract symptoms: Secondary | ICD-10-CM | POA: Diagnosis not present

## 2021-09-12 LAB — URINE CULTURE: Culture: NO GROWTH

## 2021-09-12 NOTE — Progress Notes (Signed)
Assessment: 1. Benign prostatic hyperplasia with urinary obstruction - Bladder Voiding Trial    Plan: Successful voiding trial today.  Follow-up retention precautions given.  Discussed after care and restrictions with pt. He will continue Flomax and finasteride until FU in 1 month for UA and PVR. Pt would like to consider going of finasteride due to retrograde ejaculation. Will discuss at FU.   Chief Complaint: No chief complaint on file.   HPI: Mitchell Bell is a 61 y.o. male who presents for post op evaluation of urolift procedure performed on 8/17.  Patient states he has been doing well and has no complaints.  He remains on finasteride and Flomax. He questions whether he can discontinue these medications today because he is unhappy with the retrograde ejaculation.  No gross hematuria no fever, chills.  Portions of the above documentation were copied from a prior visit for review purposes only.  Allergies: Allergies  Allergen Reactions   Amlodipine Besylate Swelling    Ankle swelling     PMH: Past Medical History:  Diagnosis Date   ADHD (attention deficit hyperactivity disorder)    Anxiety    Arthritis    At risk for sleep apnea    STOP-BANG= 4      SENT TO PCP 09-10-2014   GERD (gastroesophageal reflux disease)    History of chicken pox    History of kidney stones    History of mumps as a child    Hypertension    Right knee pain    s/p  ORIF right patella fx 09-10-2014 w/ retained hardware   Schatzki's ring     per pt    Scoliosis    BACK PAIN    PSH: Past Surgical History:  Procedure Laterality Date   HARDWARE REMOVAL Right 04/07/2015   Procedure: HARDWARE REMOVAL AND BONE FRAGMENTS,RIGHT PATELLA;  Surgeon: Ollen Gross, MD;  Location: [facility];  Service: Orthopedics;  Laterality: Right;   INGUINAL HERNIA REPAIR Left age 47   KNEE ARTHROSCOPY Bilateral 2003   ORIF LEFT ANKLE FX  1990   retained hardware   ORIF PATELLA Right 09/10/2014    Procedure: OPEN REDUCTION INTERNAL (ORIF) RIGHT PERI  PATELLA FRACTURE;  Surgeon: Ollen Gross, MD;  Location: [facility];  Service: Orthopedics;  Laterality: Right;   TOTAL KNEE ARTHROPLASTY Bilateral 09/17/2013   Procedure: TOTAL KNEE BILATERAL;  Surgeon: Loanne Drilling, MD;  Location: [facility];  Service: Orthopedics;  Laterality: Bilateral;    SH: Social History   Tobacco Use   Smoking status: Never   Smokeless tobacco: Never  Vaping Use   Vaping Use: Never used  Substance Use Topics   Alcohol use: Yes    Alcohol/week: 1.0 standard drink of alcohol    Types: 1 Shots of liquor per week    Comment: 1 DRINK A WEEK/social   Drug use: No    ROS: All other review of systems were reviewed and are negative except what is noted above in HPI  PE: BP 111/79   Pulse 96  GENERAL APPEARANCE:  Well appearing, well developed, NAD HEENT:  Atraumatic, normocephalic NECK:  Supple. Trachea midline ABDOMEN:  Soft, non-tender, no masses, no CVAT EXTREMITIES:  Moves all extremities well NEUROLOGIC:  Alert and oriented x 3 MENTAL STATUS:  appropriate SKIN:  Warm, dry, and intact   Results: Laboratory Data: Lab Results  Component Value Date   WBC 5.6 09/09/2014   HGB 16.7 04/07/2015   HCT 49.0 04/07/2015   MCV  90.7 09/09/2014   PLT 191 09/09/2014    Lab Results  Component Value Date   CREATININE 0.70 09/06/2021     Urinalysis    Component Value Date/Time   COLORURINE YELLOW 09/10/2021 1145   APPEARANCEUR CLEAR 09/10/2021 1145   APPEARANCEUR Clear 08/19/2021 0926   LABSPEC 1.012 09/10/2021 1145   PHURINE 6.0 09/10/2021 1145   GLUCOSEU NEGATIVE 09/10/2021 1145   HGBUR LARGE (A) 09/10/2021 1145   BILIRUBINUR NEGATIVE 09/10/2021 1145   BILIRUBINUR Negative 08/19/2021 0926   KETONESUR NEGATIVE 09/10/2021 1145   PROTEINUR 30 (A) 09/10/2021 1145   UROBILINOGEN 1.0 09/05/2013 0805   NITRITE NEGATIVE 09/10/2021 1145   LEUKOCYTESUR TRACE (A) 09/10/2021 1145    Lab Results   Component Value Date   LABMICR See below: 08/19/2021   WBCUA None seen 08/19/2021   LABEPIT None seen 08/19/2021   MUCUS Present 08/19/2021   BACTERIA RARE (A) 09/10/2021    Pertinent Imaging: No results found for this or any previous visit.  No results found for this or any previous visit.  No results found for this or any previous visit.  No results found for this or any previous visit.  No results found for this or any previous visit.  No results found for this or any previous visit.  No results found for this or any previous visit.  No results found for this or any previous visit.  No results found for this or any previous visit (from the past 24 hour(s)).

## 2021-09-12 NOTE — Progress Notes (Signed)
Fill and Pull Catheter Removal  Patient is present today for a catheter removal.  Patient was cleaned and prepped in a sterile fashion 200 ml of sterile water/ saline was instilled into the bladder when the patient felt the urge to urinate. 10 ml of water was then drained from the balloon.  A 16FR foley cath was removed from the bladder no complications were noted .  Patient as then given some time to void on their own.  Patient can void  225 ml on their own after some time.  Patient tolerated well.  Performed by: Kennyth Lose, CMA  Follow up/ Additional notes: Follow up as schedule

## 2021-09-15 ENCOUNTER — Telehealth: Payer: Self-pay

## 2021-09-15 NOTE — Telephone Encounter (Signed)
Patient aware of response he will reach out tomorrow for confirmation from Dr. Ronne Binning.

## 2021-09-15 NOTE — Telephone Encounter (Signed)
Patient had urololift procedure on 09/08/21, he forgot to ask you at his last visit.  When can he start having wine again and if it is ok for him to have intercourse.  Please advise.

## 2021-09-16 ENCOUNTER — Telehealth: Payer: Self-pay

## 2021-09-16 NOTE — Telephone Encounter (Signed)
Verbal from Dr. Ronne Binning, ok to resume intercourse after surgery.  Patient pain on his left side right below his belt line that seems to get worse when he stands or leans backward.  He colonoscopy the end of June and was diagnosed with diverticulosis.  He is asking if this pain could be related to his procedure with Dr. Ronne Binning.  If not he will f/u with his PCP.  Verbal from Dr. Ronne Binning, wear some tight fitting underwear to see if it gets better with support.  If it does not go away to follow up with [place].  Patient aware and voiced understanding.

## 2021-09-24 ENCOUNTER — Other Ambulatory Visit: Payer: Self-pay | Admitting: Urology

## 2021-09-24 DIAGNOSIS — N138 Other obstructive and reflux uropathy: Secondary | ICD-10-CM

## 2021-10-07 ENCOUNTER — Encounter: Payer: Self-pay | Admitting: Urology

## 2021-10-07 ENCOUNTER — Ambulatory Visit (INDEPENDENT_AMBULATORY_CARE_PROVIDER_SITE_OTHER): Payer: BC Managed Care – PPO | Admitting: Urology

## 2021-10-07 VITALS — BP 117/72 | HR 74

## 2021-10-07 DIAGNOSIS — N138 Other obstructive and reflux uropathy: Secondary | ICD-10-CM | POA: Diagnosis not present

## 2021-10-07 DIAGNOSIS — R3912 Poor urinary stream: Secondary | ICD-10-CM | POA: Diagnosis not present

## 2021-10-07 DIAGNOSIS — N401 Enlarged prostate with lower urinary tract symptoms: Secondary | ICD-10-CM | POA: Diagnosis not present

## 2021-10-07 LAB — MICROSCOPIC EXAMINATION
Bacteria, UA: NONE SEEN
Epithelial Cells (non renal): NONE SEEN /hpf (ref 0–10)
RBC, Urine: NONE SEEN /hpf (ref 0–2)
Renal Epithel, UA: NONE SEEN /hpf
WBC, UA: NONE SEEN /hpf (ref 0–5)

## 2021-10-07 LAB — URINALYSIS, ROUTINE W REFLEX MICROSCOPIC
Bilirubin, UA: NEGATIVE
Glucose, UA: NEGATIVE
Ketones, UA: NEGATIVE
Leukocytes,UA: NEGATIVE
Nitrite, UA: NEGATIVE
Protein,UA: NEGATIVE
Specific Gravity, UA: 1.015 (ref 1.005–1.030)
Urobilinogen, Ur: 0.2 mg/dL (ref 0.2–1.0)
pH, UA: 6.5 (ref 5.0–7.5)

## 2021-10-07 LAB — BLADDER SCAN AMB NON-IMAGING: Scan Result: 120

## 2021-10-07 NOTE — Progress Notes (Signed)
10/07/2021 9:09 AM   Mitchell Bell [birth year] [id number]  Referring provider: Catha Gosselin, MD [address] [place],  [place] [address]  Followup BPH   HPI: [name] is a 60yo here for followup for BPH with weak urinary stream. He underwent Urolift 09/08/2021 and passed his voiding trial 09/12/2021. He had issues with a obstructed foley post operatively and presented to the ER. His foley then spontaneously drained. PVR 120cc. Urine stream strong initially and then weakens at the end of urination. Nocturia 0x. NO significant urinary urgency.    PMH: Past Medical History:  Diagnosis Date   ADHD (attention deficit hyperactivity disorder)    Anxiety    Arthritis    At risk for sleep apnea    STOP-BANG= 4      SENT TO PCP 09-10-2014   GERD (gastroesophageal reflux disease)    History of chicken pox    History of kidney stones    History of mumps as a child    Hypertension    Right knee pain    s/p  ORIF right patella fx 09-10-2014 w/ retained hardware   Schatzki's ring     per pt    Scoliosis    BACK PAIN    Surgical History: Past Surgical History:  Procedure Laterality Date   CYSTOSCOPY WITH INSERTION OF UROLIFT N/A 09/08/2021   Procedure: CYSTOSCOPY WITH INSERTION OF UROLIFT;  Surgeon: Malen Gauze, MD;  Location: [facility];  Service: Urology;  Laterality: N/A;   HARDWARE REMOVAL Right 04/07/2015   Procedure: HARDWARE REMOVAL AND BONE FRAGMENTS,RIGHT PATELLA;  Surgeon: Ollen Gross, MD;  Location: [facility] [place];  Service: Orthopedics;  Laterality: Right;   INGUINAL HERNIA REPAIR Left age 89   KNEE ARTHROSCOPY Bilateral 2003   ORIF LEFT ANKLE FX  1990   retained hardware   ORIF PATELLA Right 09/10/2014   Procedure: OPEN REDUCTION INTERNAL (ORIF) RIGHT PERI  PATELLA FRACTURE;  Surgeon: Ollen Gross, MD;  Location: [facility];  Service: Orthopedics;  Laterality: Right;   TOTAL KNEE ARTHROPLASTY Bilateral 09/17/2013   Procedure: TOTAL KNEE BILATERAL;   Surgeon: Loanne Drilling, MD;  Location: [facility];  Service: Orthopedics;  Laterality: Bilateral;    Home Medications:  Allergies as of 10/07/2021       Reactions   Amlodipine Besylate Swelling   Ankle swelling         Medication List        Accurate as of October 07, 2021  9:09 AM. If you have any questions, ask your nurse or doctor.          finasteride 5 MG tablet Commonly known as: PROSCAR TAKE 1 TABLET (5 MG TOTAL) BY MOUTH DAILY.   hydrochlorothiazide 12.5 MG capsule Commonly known as: MICROZIDE Take 12.5 mg by mouth every morning.   losartan 50 MG tablet Commonly known as: COZAAR Take 50 mg by mouth every morning.   MegaRed Omega-3 Krill Oil 350 MG Caps Take 350 mg by mouth daily.   MULTIVITAMIN MEN 50+ PO Take 1 tablet by mouth daily.   omeprazole 20 MG capsule Commonly known as: PRILOSEC Take 20 mg by mouth every morning.   silodosin 8 MG Caps capsule Commonly known as: RAPAFLO TAKE 1 CAPSULE BY MOUTH DAILY WITH BREAKFAST. What changed: when to take this   simvastatin 40 MG tablet Commonly known as: ZOCOR Take 40 mg by mouth at bedtime.   traMADol 50 MG tablet Commonly known as: Ultram Take 1 tablet (50  mg total) by mouth every 6 (six) hours as needed. What changed: reasons to take this   Vitamin D 50 MCG (2000 UT) tablet Take 2,000 Units by mouth in the morning and at bedtime.        Allergies:  Allergies  Allergen Reactions   Amlodipine Besylate Swelling    Ankle swelling     Family History: No family history on file.  Social History:  reports that he has never smoked. He has never used smokeless tobacco. He reports current alcohol use of about 1.0 standard drink of alcohol per week. He reports that he does not use drugs.  ROS: All other review of systems were reviewed and are negative except what is noted above in HPI  Physical Exam: BP 117/72   Pulse 74   Constitutional:  Alert and oriented, No acute distress. HEENT: [place]  AT, moist mucus membranes.  Trachea midline, no masses. Cardiovascular: No clubbing, cyanosis, or edema. Respiratory: Normal respiratory effort, no increased work of breathing. GI: Abdomen is soft, nontender, nondistended, no abdominal masses GU: No CVA tenderness.  Lymph: No cervical or inguinal lymphadenopathy. Skin: No rashes, bruises or suspicious lesions. Neurologic: Grossly intact, no focal deficits, moving all 4 extremities. Psychiatric: Normal mood and affect.  Laboratory Data: Lab Results  Component Value Date   WBC 5.6 09/09/2014   HGB 16.7 04/07/2015   HCT 49.0 04/07/2015   MCV 90.7 09/09/2014   PLT 191 09/09/2014    Lab Results  Component Value Date   CREATININE 0.70 09/06/2021    No results found for: "PSA"  No results found for: "TESTOSTERONE"  No results found for: "HGBA1C"  Urinalysis    Component Value Date/Time   COLORURINE YELLOW 09/10/2021 1145   APPEARANCEUR CLEAR 09/10/2021 1145   APPEARANCEUR Clear 08/19/2021 0926   LABSPEC 1.012 09/10/2021 1145   PHURINE 6.0 09/10/2021 1145   GLUCOSEU NEGATIVE 09/10/2021 1145   HGBUR LARGE (A) 09/10/2021 1145   BILIRUBINUR NEGATIVE 09/10/2021 1145   BILIRUBINUR Negative 08/19/2021 0926   KETONESUR NEGATIVE 09/10/2021 1145   PROTEINUR 30 (A) 09/10/2021 1145   UROBILINOGEN 1.0 09/05/2013 0805   NITRITE NEGATIVE 09/10/2021 1145   LEUKOCYTESUR TRACE (A) 09/10/2021 1145    Lab Results  Component Value Date   LABMICR See below: 08/19/2021   WBCUA None seen 08/19/2021   LABEPIT None seen 08/19/2021   MUCUS Present 08/19/2021   BACTERIA RARE (A) 09/10/2021    Pertinent Imaging:  No results found for this or any previous visit.  No results found for this or any previous visit.  No results found for this or any previous visit.  No results found for this or any previous visit.  No results found for this or any previous visit.  No results found for this or any previous visit.  No results found for  this or any previous visit.  No results found for this or any previous visit.   Assessment & Plan:    1. Benign prostatic hyperplasia with urinary obstruction -patient to wean rapaflo and then stop finasteride after 3 months - Urinalysis, Routine w reflex microscopic - BLADDER SCAN AMB NON-IMAGING  2. Weak urinary stream -Improved after urolift. Patient to wean rapaflo 8mg    No follow-ups on file.  , MD  [facility] [place]

## 2021-10-07 NOTE — Progress Notes (Signed)
post void residual=120 

## 2021-10-07 NOTE — Patient Instructions (Signed)

## 2021-11-18 DIAGNOSIS — R152 Fecal urgency: Secondary | ICD-10-CM | POA: Diagnosis not present

## 2021-11-18 DIAGNOSIS — E785 Hyperlipidemia, unspecified: Secondary | ICD-10-CM | POA: Diagnosis not present

## 2021-11-18 DIAGNOSIS — Z0000 Encounter for general adult medical examination without abnormal findings: Secondary | ICD-10-CM | POA: Diagnosis not present

## 2021-11-18 DIAGNOSIS — I10 Essential (primary) hypertension: Secondary | ICD-10-CM | POA: Diagnosis not present

## 2021-11-18 DIAGNOSIS — R079 Chest pain, unspecified: Secondary | ICD-10-CM | POA: Diagnosis not present

## 2021-11-18 DIAGNOSIS — K9041 Non-celiac gluten sensitivity: Secondary | ICD-10-CM | POA: Diagnosis not present

## 2021-12-02 ENCOUNTER — Ambulatory Visit: Payer: BC Managed Care – PPO | Admitting: Internal Medicine

## 2021-12-02 ENCOUNTER — Encounter: Payer: Self-pay | Admitting: Internal Medicine

## 2021-12-02 VITALS — BP 134/86 | HR 68 | Ht 70.5 in | Wt 234.0 lb

## 2021-12-02 DIAGNOSIS — E782 Mixed hyperlipidemia: Secondary | ICD-10-CM | POA: Insufficient documentation

## 2021-12-02 DIAGNOSIS — R079 Chest pain, unspecified: Secondary | ICD-10-CM | POA: Insufficient documentation

## 2021-12-02 DIAGNOSIS — I209 Angina pectoris, unspecified: Secondary | ICD-10-CM | POA: Insufficient documentation

## 2021-12-02 DIAGNOSIS — I10 Essential (primary) hypertension: Secondary | ICD-10-CM

## 2021-12-02 NOTE — Progress Notes (Signed)
Appeal letter started/printed and faxed with appropriate documentation requested.   [employer] Plans Attn: Grievance and Appeals Unit [id number] [place], [place] [address] Fax: [phone]

## 2021-12-02 NOTE — Progress Notes (Signed)
Primary Physician/Referring:  Carin Hock, PA  Patient ID: Mitchell Bell, male    DOB: 1960/07/13, 61 y.o.   MRN: [id number]  Chief Complaint  Patient presents with   Chest Pain   Hypertension   HPI:    Mitchell Bell  is a 61 y.o. male with past medical history significant for hypertension and hyperlipidemia who is here to establish care with cardiology.  Patient recently had a physical with his PCP and it was recommended he checked out by cardiology.  Patient has been having chest pain and dyspnea on exertion with activity recently.  Patient has never had anything like this happen to him before.  He states he is compliant with his medications.  He has never been diagnosed with heart disease however it does run in his family.  He does not smoke or drink alcohol.  Patient has never had a stress test or an echocardiogram in the past.  He denies palpitations, diaphoresis, syncope, claudication, edema, orthopnea, PND.  Past Medical History:  Diagnosis Date   ADHD (attention deficit hyperactivity disorder)    Anxiety    Arthritis    At risk for sleep apnea    STOP-BANG= 4      SENT TO PCP 09-10-2014   GERD (gastroesophageal reflux disease)    History of chicken pox    History of kidney stones    History of mumps as a child    Hypertension    Right knee pain    s/p  ORIF right patella fx 09-10-2014 w/ retained hardware   Schatzki's ring     per pt    Scoliosis    BACK PAIN   Past Surgical History:  Procedure Laterality Date   CYSTOSCOPY WITH INSERTION OF UROLIFT N/A 09/08/2021   Procedure: CYSTOSCOPY WITH INSERTION OF UROLIFT;  Surgeon: Malen Gauze, MD;  Location: [facility];  Service: Urology;  Laterality: N/A;   HARDWARE REMOVAL Right 04/07/2015   Procedure: HARDWARE REMOVAL AND BONE FRAGMENTS,RIGHT PATELLA;  Surgeon: Ollen Gross, MD;  Location: [facility] [place];  Service: Orthopedics;  Laterality: Right;   INGUINAL HERNIA REPAIR Left age 58   KNEE ARTHROSCOPY  Bilateral 2003   ORIF LEFT ANKLE FX  1990   retained hardware   ORIF PATELLA Right 09/10/2014   Procedure: OPEN REDUCTION INTERNAL (ORIF) RIGHT PERI  PATELLA FRACTURE;  Surgeon: Ollen Gross, MD;  Location: [facility];  Service: Orthopedics;  Laterality: Right;   TOTAL KNEE ARTHROPLASTY Bilateral 09/17/2013   Procedure: TOTAL KNEE BILATERAL;  Surgeon: Loanne Drilling, MD;  Location: [facility];  Service: Orthopedics;  Laterality: Bilateral;   Family History  Problem Relation Age of Onset   Heart attack Father     Social History   Tobacco Use   Smoking status: Never   Smokeless tobacco: Never  Substance Use Topics   Alcohol use: Yes    Alcohol/week: 1.0 standard drink of alcohol    Types: 1 Shots of liquor per week    Comment: 1 DRINK A WEEK/social   Marital Status: Married  ROS  Review of Systems  Cardiovascular:  Positive for chest pain and dyspnea on exertion.   Objective  Blood pressure 134/86, pulse 68, height 5' 10.5" (1.791 m), weight 234 lb (106.1 kg), SpO2 98 %. Body mass index is 33.1 kg/m.     12/02/2021    8:17 AM 10/07/2021    8:41 AM 09/12/2021    8:35 AM  Vitals with BMI  Height 5'  10.5"    Weight 234 lbs    BMI 33.09    Systolic 134 117 706  Diastolic 86 72 79  Pulse 68 74 96     Physical Exam Vitals reviewed.  HENT:     Head: Normocephalic and atraumatic.  Cardiovascular:     Rate and Rhythm: Normal rate and regular rhythm.     Pulses: Normal pulses.     Heart sounds: Normal heart sounds. No murmur heard. Pulmonary:     Effort: Pulmonary effort is normal.     Breath sounds: Normal breath sounds.  Abdominal:     General: Bowel sounds are normal.  Musculoskeletal:     Right lower leg: No edema.     Left lower leg: No edema.  Skin:    General: Skin is warm and dry.  Neurological:     Mental Status: He is alert.    Medications and allergies   Allergies  Allergen Reactions   Amlodipine Besylate Swelling    Ankle swelling      Medication list  after today's encounter   Current Outpatient Medications:    Cholecalciferol (VITAMIN D) 50 MCG (2000 UT) tablet, Take 2,000 Units by mouth in the morning and at bedtime., Disp: , Rfl:    finasteride (PROSCAR) 5 MG tablet, TAKE 1 TABLET (5 MG TOTAL) BY MOUTH DAILY., Disp: 30 tablet, Rfl: 11   hydrochlorothiazide (MICROZIDE) 12.5 MG capsule, Take 12.5 mg by mouth every morning., Disp: , Rfl:    losartan (COZAAR) 50 MG tablet, Take 50 mg by mouth every morning., Disp: , Rfl:    MegaRed Omega-3 Krill Oil 350 MG CAPS, Take 350 mg by mouth daily., Disp: , Rfl:    Multiple Vitamins-Minerals (MULTIVITAMIN MEN 50+ PO), Take 1 tablet by mouth daily., Disp: , Rfl:    omeprazole (PRILOSEC) 20 MG capsule, Take 20 mg by mouth every morning., Disp: , Rfl:    simvastatin (ZOCOR) 40 MG tablet, Take 40 mg by mouth at bedtime., Disp: , Rfl:   Laboratory examination:   Lab Results  Component Value Date   NA 138 09/06/2021   K 3.4 (L) 09/06/2021   CO2 26 09/06/2021   GLUCOSE 112 (H) 09/06/2021   BUN 11 09/06/2021   CREATININE 0.70 09/06/2021   CALCIUM 9.0 09/06/2021   GFRNONAA >60 09/06/2021       Latest Ref Rng & Units 09/06/2021    3:41 PM 04/07/2015    9:42 AM 09/09/2014   10:45 AM  CMP  Glucose 70 - 99 mg/dL 237  628  315   BUN 6 - 20 mg/dL 11  17  14    Creatinine 0.61 - 1.24 mg/dL  1.76  1.60   Sodium 135 - 145 mmol/L 138  142  140   Potassium 3.5 - 5.1 mmol/L 3.4  3.8  4.0   Chloride 98 - 111 mmol/L 106  104  104   CO2 22 - 32 mmol/L 26   30   Calcium 8.9 - 10.3 mg/dL 9.0   9.7       Latest Ref Rng & Units 04/07/2015    9:42 AM 09/09/2014   10:45 AM 09/21/2013    4:04 AM  CBC  WBC 4.0 - 10.5 K/uL  5.6  8.6   Hemoglobin 13.0 - 17.0 g/dL 09/23/2013  10.6  26.9   Hematocrit 39.0 - 52.0 % 49.0  42.7  30.5   Platelets 150 - 400 K/uL  191  202  Lipid Panel No results for input(s): "CHOL", "TRIG", "LDLCALC", "VLDL", "HDL", "CHOLHDL", "LDLDIRECT" in the last 8760 hours.  HEMOGLOBIN  A1C No results found for: "HGBA1C", "MPG" TSH No results for input(s): "TSH" in the last 8760 hours.  External labs:     Radiology:    Cardiac Studies:   No results found for this or any previous visit from the past 1095 days.     No results found for this or any previous visit from the past 1095 days.     EKG:   12/02/2021: Sinus Rhythm. Low voltage in limb leads. Normal R wave progression  Assessment     ICD-10-CM   1. Chest pain of uncertain etiology  R07.9 EKG 12-Lead    2. Essential hypertension  I10     3. Mixed hyperlipidemia  E78.2        Orders Placed This Encounter  Procedures   EKG 12-Lead    No orders of the defined types were placed in this encounter.   Medications Discontinued During This Encounter  Medication Reason   traMADol (ULTRAM) 50 MG tablet    silodosin (RAPAFLO) 8 MG CAPS capsule Completed Course     Recommendations:   Mitchell Bell is a 61 y.o.  with HTN and HLD presenting with chest pain   Chest pain of uncertain etiology, suggestive of angina Echocardiogram and stress test ordered Patient instructed not to do heavy lifting, heavy exertional activity, swimming until evaluation is complete.  Patient instructed to call if symptoms worse or to go to the ED for further evaluation.   Essential hypertension Continue current cardiac medications. Encourage low-sodium diet, less than 2000 mg daily. Follow-up in 2 months or sooner if needed.   Mixed hyperlipidemia Continue statin      Clotilde Dieter, DO, [facility]  12/02/2021, 8:39 AM Office: [phone] Pager: [phone]

## 2021-12-20 ENCOUNTER — Ambulatory Visit: Payer: BC Managed Care – PPO

## 2021-12-20 DIAGNOSIS — I10 Essential (primary) hypertension: Secondary | ICD-10-CM | POA: Diagnosis not present

## 2021-12-20 DIAGNOSIS — R079 Chest pain, unspecified: Secondary | ICD-10-CM

## 2021-12-20 DIAGNOSIS — E782 Mixed hyperlipidemia: Secondary | ICD-10-CM

## 2021-12-23 ENCOUNTER — Other Ambulatory Visit: Payer: BC Managed Care – PPO

## 2021-12-26 ENCOUNTER — Other Ambulatory Visit: Payer: BC Managed Care – PPO

## 2021-12-27 ENCOUNTER — Ambulatory Visit: Payer: BC Managed Care – PPO

## 2021-12-27 DIAGNOSIS — R079 Chest pain, unspecified: Secondary | ICD-10-CM

## 2021-12-27 DIAGNOSIS — E782 Mixed hyperlipidemia: Secondary | ICD-10-CM

## 2021-12-27 DIAGNOSIS — I10 Essential (primary) hypertension: Secondary | ICD-10-CM | POA: Diagnosis not present

## 2022-01-03 ENCOUNTER — Other Ambulatory Visit: Payer: Self-pay | Admitting: Internal Medicine

## 2022-01-03 MED ORDER — ASPIRIN 81 MG PO TBEC: 81.0000 mg | DELAYED_RELEASE_TABLET | Freq: Every day | ORAL | 3 refills | Status: DC

## 2022-01-03 MED ORDER — METOPROLOL SUCCINATE ER 25 MG PO TB24: 25.0000 mg | ORAL_TABLET | Freq: Every day | ORAL | 6 refills | Status: DC

## 2022-01-03 NOTE — Progress Notes (Signed)
Can I add to your cath schedule this Friday if he answers the phone

## 2022-01-03 NOTE — Progress Notes (Signed)
Let me know if you are able to get ahold of this pt please., I just tried calling. I need to schedule him for heart cath

## 2022-01-04 ENCOUNTER — Other Ambulatory Visit: Payer: Self-pay | Admitting: Internal Medicine

## 2022-01-04 DIAGNOSIS — R079 Chest pain, unspecified: Secondary | ICD-10-CM

## 2022-01-05 ENCOUNTER — Other Ambulatory Visit: Payer: Self-pay | Admitting: Internal Medicine

## 2022-01-05 DIAGNOSIS — R079 Chest pain, unspecified: Secondary | ICD-10-CM | POA: Diagnosis not present

## 2022-01-06 ENCOUNTER — Ambulatory Visit (HOSPITAL_COMMUNITY)
Admission: RE | Admit: 2022-01-06 | Discharge: 2022-01-06 | Disposition: A | Discharge status: Home / Self Care | Payer: BC Managed Care – PPO | Attending: Cardiology | Admitting: Cardiology

## 2022-01-06 ENCOUNTER — Telehealth: Payer: Self-pay | Admitting: Cardiology

## 2022-01-06 ENCOUNTER — Other Ambulatory Visit: Payer: Self-pay

## 2022-01-06 ENCOUNTER — Encounter (HOSPITAL_COMMUNITY)
Admission: RE | Disposition: A | Discharge status: Home / Self Care | Payer: Self-pay | Source: Home / Self Care | Attending: Cardiology

## 2022-01-06 DIAGNOSIS — I10 Essential (primary) hypertension: Secondary | ICD-10-CM | POA: Diagnosis not present

## 2022-01-06 DIAGNOSIS — R079 Chest pain, unspecified: Secondary | ICD-10-CM | POA: Insufficient documentation

## 2022-01-06 DIAGNOSIS — E785 Hyperlipidemia, unspecified: Secondary | ICD-10-CM | POA: Diagnosis not present

## 2022-01-06 DIAGNOSIS — R0609 Other forms of dyspnea: Secondary | ICD-10-CM | POA: Diagnosis not present

## 2022-01-06 DIAGNOSIS — I209 Angina pectoris, unspecified: Secondary | ICD-10-CM | POA: Diagnosis present

## 2022-01-06 DIAGNOSIS — R9439 Abnormal result of other cardiovascular function study: Secondary | ICD-10-CM | POA: Diagnosis not present

## 2022-01-06 HISTORY — PX: CORONARY ANGIOGRAPHY: CATH118303

## 2022-01-06 LAB — BASIC METABOLIC PANEL
Anion gap: 8 (ref 5–15)
BUN/Creatinine Ratio: 13 (ref 9–20)
BUN: 11 mg/dL (ref 6–24)
BUN: 9 mg/dL (ref 6–20)
CO2: 23 mmol/L (ref 20–29)
CO2: 25 mmol/L (ref 22–32)
Calcium: 9.3 mg/dL (ref 8.7–10.2)
Calcium: 9 mg/dL (ref 8.9–10.3)
Chloride: 99 mmol/L (ref 96–106)
Chloride: 107 mmol/L (ref 98–111)
Creatinine, Ser: 0.86 mg/dL (ref 0.76–1.27)
Creatinine, Ser: 0.71 mg/dL (ref 0.61–1.24)
GFR, Estimated: >60 mL/min (ref >60)
Glucose, Bld: 106 mg/dL — ABNORMAL HIGH (ref 70–99)
Glucose: 86 mg/dL (ref 70–99)
Potassium: 3.8 mmol/L (ref 3.5–5.2)
Potassium: 3.6 mmol/L (ref 3.5–5.1)
Sodium: 138 mmol/L (ref 134–144)
Sodium: 140 mmol/L (ref 135–145)
eGFR: 100 mL/min/{1.73_m2} (ref >59)

## 2022-01-06 LAB — CBC
HCT: 40.2 % (ref 39.0–52.0)
Hematocrit: 42.5 % (ref 37.5–51.0)
Hemoglobin: 14.5 g/dL (ref 13.0–17.7)
Hemoglobin: 14.3 g/dL (ref 13.0–17.0)
MCH: 31.2 pg (ref 26.6–33.0)
MCH: 31.8 pg (ref 26.0–34.0)
MCHC: 34.1 g/dL (ref 31.5–35.7)
MCHC: 35.6 g/dL (ref 30.0–36.0)
MCV: 91 fL (ref 79–97)
MCV: 89.5 fL (ref 80.0–100.0)
Platelets: 180 10*3/uL (ref 150–450)
Platelets: 162 10*3/uL (ref 150–400)
RBC: 4.65 x10E6/uL (ref 4.14–5.80)
RBC: 4.49 MIL/uL (ref 4.22–5.81)
RDW: 12.6 % (ref 11.6–15.4)
RDW: 11.9 % (ref 11.5–15.5)
WBC: 8.2 10*3/uL (ref 3.4–10.8)
WBC: 5.1 10*3/uL (ref 4.0–10.5)
nRBC: 0 % (ref 0.0–0.2)

## 2022-01-06 SURGERY — CORONARY ANGIOGRAPHY (CATH LAB)
Anesthesia: LOCAL

## 2022-01-06 MED ORDER — SODIUM CHLORIDE 0.9% FLUSH: 3.0000 mL | INTRAVENOUS | Status: DC | PRN

## 2022-01-06 MED ORDER — SODIUM CHLORIDE 0.9 % IV SOLN: 250.0000 mL | INTRAVENOUS | Status: DC | PRN

## 2022-01-06 MED ORDER — LIDOCAINE HCL (PF) 1 % IJ SOLN
INTRAMUSCULAR | Status: DC | PRN
  Administered 2022-01-06: 2 mL

## 2022-01-06 MED ORDER — FENTANYL CITRATE (PF) 100 MCG/2ML IJ SOLN
INTRAMUSCULAR | Status: AC
  Filled 2022-01-06: qty 2

## 2022-01-06 MED ORDER — MIDAZOLAM HCL 2 MG/2ML IJ SOLN
INTRAMUSCULAR | Status: DC | PRN
  Administered 2022-01-06: 2 mg via INTRAVENOUS

## 2022-01-06 MED ORDER — IOHEXOL 350 MG/ML SOLN
INTRAVENOUS | Status: DC | PRN
  Administered 2022-01-06: 50 mL

## 2022-01-06 MED ORDER — ASPIRIN 81 MG PO CHEW: 81.0000 mg | CHEWABLE_TABLET | ORAL | Status: DC

## 2022-01-06 MED ORDER — HEPARIN SODIUM (PORCINE) 1000 UNIT/ML IJ SOLN
INTRAMUSCULAR | Status: AC
  Filled 2022-01-06: qty 10

## 2022-01-06 MED ORDER — SODIUM CHLORIDE 0.9 % WEIGHT BASED INFUSION: 3.0000 mL/kg/h | INTRAVENOUS | Status: DC

## 2022-01-06 MED ORDER — FENTANYL CITRATE (PF) 100 MCG/2ML IJ SOLN
INTRAMUSCULAR | Status: DC | PRN
  Administered 2022-01-06: 25 ug via INTRAVENOUS

## 2022-01-06 MED ORDER — SODIUM CHLORIDE 0.9 % WEIGHT BASED INFUSION: 1.0000 mL/kg/h | INTRAVENOUS | Status: DC

## 2022-01-06 MED ORDER — VERAPAMIL HCL 2.5 MG/ML IV SOLN
INTRAVENOUS | Status: AC
  Filled 2022-01-06: qty 2

## 2022-01-06 MED ORDER — MIDAZOLAM HCL 2 MG/2ML IJ SOLN
INTRAMUSCULAR | Status: AC
  Filled 2022-01-06: qty 2

## 2022-01-06 MED ORDER — SODIUM CHLORIDE 0.9 % WEIGHT BASED INFUSION
3.0000 mL/kg/h | INTRAVENOUS | Status: AC
  Administered 2022-01-06: 3 mL/kg/h via INTRAVENOUS

## 2022-01-06 MED ORDER — HEPARIN SODIUM (PORCINE) 1000 UNIT/ML IJ SOLN
INTRAMUSCULAR | Status: DC | PRN
  Administered 2022-01-06: 3000 [IU] via INTRAVENOUS
  Administered 2022-01-06: 2000 [IU] via INTRAVENOUS

## 2022-01-06 MED ORDER — ONDANSETRON HCL 4 MG/2ML IJ SOLN: 4.0000 mg | Freq: Four times a day (QID) | INTRAMUSCULAR | Status: DC | PRN

## 2022-01-06 MED ORDER — HEPARIN (PORCINE) IN NACL 1000-0.9 UT/500ML-% IV SOLN
INTRAVENOUS | Status: AC
  Filled 2022-01-06: qty 1000

## 2022-01-06 MED ORDER — ASPIRIN 81 MG PO CHEW
81.0000 mg | CHEWABLE_TABLET | ORAL | Status: AC
  Administered 2022-01-06: 81 mg via ORAL
  Filled 2022-01-06: qty 1

## 2022-01-06 MED ORDER — LIDOCAINE HCL (PF) 1 % IJ SOLN
INTRAMUSCULAR | Status: AC
  Filled 2022-01-06: qty 30

## 2022-01-06 MED ORDER — VERAPAMIL HCL 2.5 MG/ML IV SOLN
INTRAVENOUS | Status: DC | PRN
  Administered 2022-01-06: 10 mL via INTRA_ARTERIAL

## 2022-01-06 MED ORDER — SODIUM CHLORIDE 0.9% FLUSH
3.0000 mL | Freq: Two times a day (BID) | INTRAVENOUS | Status: DC
Start: 2022-01-06 — End: 2022-01-06

## 2022-01-06 MED ORDER — HEPARIN (PORCINE) IN NACL 1000-0.9 UT/500ML-% IV SOLN
INTRAVENOUS | Status: DC | PRN
  Administered 2022-01-06 (×2): 500 mL

## 2022-01-06 MED ORDER — ACETAMINOPHEN 325 MG PO TABS: 650.0000 mg | ORAL_TABLET | ORAL | Status: DC | PRN

## 2022-01-06 SURGICAL SUPPLY — 12 items
CATH 5FR JL3.5 JR4 ANG PIG MP (CATHETERS) IMPLANT
CATH INFINITI 5 FR IM (CATHETERS) IMPLANT
CATH OPTITORQUE TIG 4.0 5F (CATHETERS) IMPLANT
DEVICE RAD COMP TR BAND LRG (VASCULAR PRODUCTS) IMPLANT
GLIDESHEATH SLEND A-KIT 6F 22G (SHEATH) IMPLANT
GUIDEWIRE ANGLED .035X150CM (WIRE) IMPLANT
GUIDEWIRE INQWIRE 1.5J.035X260 (WIRE) IMPLANT
INQWIRE 1.5J .035X260CM (WIRE) ×1
KIT HEART LEFT (KITS) ×1 IMPLANT
PACK CARDIAC CATHETERIZATION (CUSTOM PROCEDURE TRAY) ×1 IMPLANT
TRANSDUCER W/STOPCOCK (MISCELLANEOUS) ×1 IMPLANT
TUBING CIL FLEX 10 FLL-RA (TUBING) ×1 IMPLANT

## 2022-01-06 NOTE — Discharge Instructions (Signed)

## 2022-01-06 NOTE — Interval H&P Note (Signed)
History and Physical Interval Note:  01/06/2022 10:50 AM  Mitchell Bell  has presented today for surgery, with the diagnosis of chest pain.  The various methods of treatment have been discussed with the patient and family. After consideration of risks, benefits and other options for treatment, the patient has consented to  Procedure(s): LEFT HEART CATH AND CORONARY ANGIOGRAPHY (N/A) and possible angioplasty as a surgical intervention.  The patient's history has been reviewed, patient examined, no change in status, stable for surgery.  I have reviewed the patient's chart and labs.  Questions were answered to the patient's satisfaction.     Yates Decamp

## 2022-01-06 NOTE — Progress Notes (Signed)
Primary Physician/Referring:  Amelia Jo, PA  Patient ID: Mitchell Bell, male    DOB: 1960/09/04, 61 y.o.   MRN: [id number]  CC: Chest pain  HPI:    Mitchell Bell  is a 61 y.o. male with past medical history significant for hypertension and hyperlipidemia who is here to establish care with cardiology.  Patient recently had a physical with his PCP and it was recommended he checked out by cardiology.  Patient has been having chest pain and dyspnea on exertion with activity recently.  He denies palpitations, diaphoresis, syncope, claudication, edema, orthopnea, PND.  NO chest pain presently and he has not exerted much since the stress test  Past Medical History:  Diagnosis Date   ADHD (attention deficit hyperactivity disorder)    Anxiety    Arthritis    At risk for sleep apnea    STOP-BANG= 4      SENT TO PCP 09-10-2014   GERD (gastroesophageal reflux disease)    History of chicken pox    History of kidney stones    History of mumps as a child    Hypertension    Right knee pain    s/p  ORIF right patella fx 09-10-2014 w/ retained hardware   Schatzki's ring     per pt    Scoliosis    BACK PAIN   Past Surgical History:  Procedure Laterality Date   CYSTOSCOPY WITH INSERTION OF UROLIFT N/A 09/08/2021   Procedure: CYSTOSCOPY WITH INSERTION OF UROLIFT;  Surgeon: Cleon Gustin, MD;  Location: [facility];  Service: Urology;  Laterality: N/A;   HARDWARE REMOVAL Right 04/07/2015   Procedure: HARDWARE REMOVAL AND BONE FRAGMENTS,RIGHT PATELLA;  Surgeon: Gaynelle Arabian, MD;  Location: [facility];  Service: Orthopedics;  Laterality: Right;   INGUINAL HERNIA REPAIR Left age 65   KNEE ARTHROSCOPY Bilateral 2003   ORIF LEFT ANKLE FX  1990   retained hardware   ORIF PATELLA Right 09/10/2014   Procedure: OPEN REDUCTION INTERNAL (ORIF) RIGHT PERI  PATELLA FRACTURE;  Surgeon: Gaynelle Arabian, MD;  Location: [facility];  Service: Orthopedics;  Laterality: Right;   TOTAL KNEE  ARTHROPLASTY Bilateral 09/17/2013   Procedure: TOTAL KNEE BILATERAL;  Surgeon: Gearlean Alf, MD;  Location: [facility];  Service: Orthopedics;  Laterality: Bilateral;   Family History  Problem Relation Age of Onset   Heart attack Father     Social History   Tobacco Use   Smoking status: Never   Smokeless tobacco: Never  Substance Use Topics   Alcohol use: Yes    Alcohol/week: 1.0 standard drink of alcohol    Types: 1 Shots of liquor per week    Comment: 1 DRINK A WEEK/social   Marital Status: Married  ROS  Review of Systems  Cardiovascular:  Positive for chest pain and dyspnea on exertion.   Objective  Blood pressure (!) 130/90, pulse 62, temperature (!) 97.2 F (36.2 C), temperature source Temporal, resp. rate 17, height 5' 10.5" (1.791 m), weight 102.5 kg, SpO2 98 %. Body mass index is 31.97 kg/m.     01/06/2022    7:24 AM 12/02/2021    8:17 AM 10/07/2021    8:41 AM  Vitals with BMI  Height 5' 10.5" 5' 10.5"   Weight 226 lbs 234 lbs   BMI 10.07 12.19   Systolic 758 832 549  Diastolic 90 86 72  Pulse 62 68 74     Physical Exam Vitals reviewed.  HENT:  Head: Normocephalic and atraumatic.  Cardiovascular:     Rate and Rhythm: Normal rate and regular rhythm.     Pulses: Normal pulses.     Heart sounds: Normal heart sounds. No murmur heard. Pulmonary:     Effort: Pulmonary effort is normal.     Breath sounds: Normal breath sounds.  Abdominal:     General: Bowel sounds are normal.  Musculoskeletal:     Right lower leg: No edema.     Left lower leg: No edema.  Skin:    General: Skin is warm and dry.  Neurological:     Mental Status: He is alert.    Medications and allergies   Allergies  Allergen Reactions   Amlodipine Besylate Swelling    Ankle swelling      Medication    Current Meds  Medication Sig   aspirin EC 81 MG tablet Take 1 tablet (81 mg total) by mouth daily.   Cholecalciferol (VITAMIN D) 50 MCG (2000 UT) tablet Take 2,000 Units by  mouth in the morning and at bedtime.   finasteride (PROSCAR) 5 MG tablet TAKE 1 TABLET (5 MG TOTAL) BY MOUTH DAILY.   hydrochlorothiazide (MICROZIDE) 12.5 MG capsule Take 12.5 mg by mouth every morning.   losartan (COZAAR) 50 MG tablet Take 50 mg by mouth every morning.   MegaRed Omega-3 Krill Oil 350 MG CAPS Take 350 mg by mouth daily.   metoprolol succinate (TOPROL XL) 25 MG 24 hr tablet Take 1 tablet (25 mg total) by mouth daily.   Multiple Vitamins-Minerals (MULTIVITAMIN MEN 50+ PO) Take 1 tablet by mouth daily.   omeprazole (PRILOSEC) 20 MG capsule Take 20 mg by mouth every morning.   simvastatin (ZOCOR) 40 MG tablet Take 40 mg by mouth at bedtime.      Current Facility-Administered Medications:    0.9 %  sodium chloride infusion, 250 mL, Intravenous, PRN, Adrian Prows, MD   [EXPIRED] 0.9% sodium chloride infusion, 3 mL/kg/hr, Intravenous, Continuous, Last Rate: 307.5 mL/hr at 01/06/22 0753, 3 mL/kg/hr at 01/06/22 0753 **FOLLOWED BY** 0.9% sodium chloride infusion, 1 mL/kg/hr, Intravenous, Continuous, Adrian Prows, MD, Last Rate: 102.5 mL/hr at 01/06/22 0853, 1 mL/kg/hr at 01/06/22 0853   sodium chloride flush (NS) 0.9 % injection 3 mL, 3 mL, Intravenous, Q12H, Adrian Prows, MD   sodium chloride flush (NS) 0.9 % injection 3 mL, 3 mL, Intravenous, PRN, Adrian Prows, MD  Laboratory examination:   Lab Results  Component Value Date   NA 140 01/06/2022   K 3.6 01/06/2022   CO2 25 01/06/2022   GLUCOSE 106 (H) 01/06/2022   BUN 9 01/06/2022   CREATININE 0.71 01/06/2022   CALCIUM 9.0 01/06/2022   EGFR 100 01/05/2022   GFRNONAA >60 01/06/2022       Latest Ref Rng & Units 01/06/2022    7:55 AM 01/05/2022   11:14 AM 09/06/2021    3:41 PM  CMP  Glucose 70 - 99 mg/dL 106  86  112   BUN 6 - 20 mg/dL _0 Creatinine 0.61 - 1.24 mg/dL 0.71  0.86  0.70   Sodium 135 - 145 mmol/L 140  138  138   Potassium 3.5 - 5.1 mmol/L 3.6  3.8  3.4   Chloride 98 - 111 mmol/L 107  99  106   CO2 22 -  32 mmol/L _1 Calcium 8.9 - 10.3 mg/dL 9.0  9.3  9.0  Latest Ref Rng & Units 01/06/2022    7:55 AM 01/05/2022   11:14 AM 04/07/2015    9:42 AM  CBC  WBC 4.0 - 10.5 K/uL 5.1  8.2    Hemoglobin 13.0 - 17.0 g/dL 14.3  14.5  16.7   Hematocrit 39.0 - 52.0 % 40.2  42.5  49.0   Platelets 150 - 400 K/uL 162  180     External labs:   Cholesterol, total 121.000 m 11/18/2021 HDL 39.000 mg 11/18/2021 LDL 59.000 mg 11/18/2021 Triglycerides 130.000 m 11/18/2021  Hemoglobin 15.200 g/d 11/18/2021  Creatinine, Serum 0.750 mg/ 11/18/2021 Potassium 4.100 mm 11/18/2021 ALT (SGPT) 37.000 U/L 11/18/2021   Radiology:    Cardiac Studies:   PCV ECHOCARDIOGRAM COMPLETE 12/20/2021  Narrative Echocardiogram 12/20/2021: Left ventricle cavity is normal in size and wall thickness. Cannot exclude mild hypokinesis of mid to apical inferolateral myocardium. Visualization limited due to poor endocardial definition. Normal LV systolic function with EF 54%. Normal diastolic filling pattern. Mild to moderate mitral regurgitation. Mild tricuspid regurgitation. No evidence of pulmonary hypertension.    PCV MYOCARDIAL PERFUSION WO LEXISCAN 12/27/2021  Narrative Exercise nuclear stress test 12/27/2021: There is no ischemia or scar by nuclear perfusion imaging. Diaphragmatic attenuation in the inferior wall and apical thinning noted. The LV is mildly dilated in both rest and stress images.  Overall LV systolic function is moderately abnormal with global hypokinesis. Stress LV EF: 32%. Stress EKG is negative for ischemia. The patient exercised for 8 minutes and 57 seconds of a Bruce protocol, achieving 10.16 METs and 90% MPHR. Stress terminated due to fatigue. There were frequent multifocal PVC in the recovery phase. Rare PAC. The baseline blood pressure was 124/86 mmHg and increased to 190/100 mmHg, which is a hypertensive response to exercise. No previous exam available for comparison. High  risk study due to low LVEF, frequent multifocal PVC in recovery.   EKG:   12/02/2021: Sinus Rhythm. Low voltage in limb leads. Normal R wave progression  Assessment   1.  Angina pectoris 2.  Abnormal nuclear stress test, high risk with severe LV systolic dysfunction and frequent PVCs. 3.  Primary hypertension 4.  Recommendations:   Mitchell Bell is a 62 y.o.  with HTN and HLD presenting with chest pain suggestive of angina pectoris, he has a high risk nuclear stress test.  Patient has had extensive discussion regarding proceeding with cardiac catheterization, he is only on minimal medical therapy with regard to antianginal medications.  However in view of high risk she admission made to proceed.  Schedule for cardiac catheterization, and possible angioplasty. We discussed regarding risks, benefits, alternatives to this including stress testing, CTA and continued medical therapy. Patient wants to proceed. Understands <1-2% risk of death, stroke, MI, urgent CABG, bleeding, infection, renal failure but not limited to these.    Adrian Prows, MD, [facility] 01/06/2022, 10:23 AM Office: [phone] Fax: [phone] Pager: [phone]

## 2022-01-06 NOTE — Telephone Encounter (Signed)
Patient called the on-call service this afternoon.  He recently had a heart cath and was noted to have no obstructive CAD.  His question was if he should continue taking aspirin.    Based on the heart cath report he was advised to stop aspirin and this was we conveyed to the patient and wife over the phone  With regards to other cardiovascular medications have asked him to continue for now until his follow-up visit with his primary cardiologist Dr. Clotilde Dieter.  No charge.  Mitchell Bell [place], DO, [facility]

## 2022-01-06 NOTE — H&P (View-Only) (Signed)
Primary Physician/Referring:  Amelia Jo, PA  Patient ID: Mitchell Bell, male    DOB: 1960/09/04, 61 y.o.   MRN: [id number]  CC: Chest pain  HPI:    [name]  is a 61 y.o. male with past medical history significant for hypertension and hyperlipidemia who is here to establish care with cardiology.  Patient recently had a physical with his PCP and it was recommended he checked out by cardiology.  Patient has been having chest pain and dyspnea on exertion with activity recently.  He denies palpitations, diaphoresis, syncope, claudication, edema, orthopnea, PND.  NO chest pain presently and he has not exerted much since the stress test  Past Medical History:  Diagnosis Date   ADHD (attention deficit hyperactivity disorder)    Anxiety    Arthritis    At risk for sleep apnea    STOP-BANG= 4      SENT TO PCP 09-10-2014   GERD (gastroesophageal reflux disease)    History of chicken pox    History of kidney stones    History of mumps as a child    Hypertension    Right knee pain    s/p  ORIF right patella fx 09-10-2014 w/ retained hardware   Schatzki's ring     per pt    Scoliosis    BACK PAIN   Past Surgical History:  Procedure Laterality Date   CYSTOSCOPY WITH INSERTION OF UROLIFT N/A 09/08/2021   Procedure: CYSTOSCOPY WITH INSERTION OF UROLIFT;  Surgeon: Cleon Gustin, MD;  Location: [facility];  Service: Urology;  Laterality: N/A;   HARDWARE REMOVAL Right 04/07/2015   Procedure: HARDWARE REMOVAL AND BONE FRAGMENTS,RIGHT PATELLA;  Surgeon: Gaynelle Arabian, MD;  Location: [facility];  Service: Orthopedics;  Laterality: Right;   INGUINAL HERNIA REPAIR Left age 65   KNEE ARTHROSCOPY Bilateral 2003   ORIF LEFT ANKLE FX  1990   retained hardware   ORIF PATELLA Right 09/10/2014   Procedure: OPEN REDUCTION INTERNAL (ORIF) RIGHT PERI  PATELLA FRACTURE;  Surgeon: Gaynelle Arabian, MD;  Location: [facility];  Service: Orthopedics;  Laterality: Right;   TOTAL KNEE  ARTHROPLASTY Bilateral 09/17/2013   Procedure: TOTAL KNEE BILATERAL;  Surgeon: Gearlean Alf, MD;  Location: [facility];  Service: Orthopedics;  Laterality: Bilateral;   Family History  Problem Relation Age of Onset   Heart attack Father     Social History   Tobacco Use   Smoking status: Never   Smokeless tobacco: Never  Substance Use Topics   Alcohol use: Yes    Alcohol/week: 1.0 standard drink of alcohol    Types: 1 Shots of liquor per week    Comment: 1 DRINK A WEEK/social   Marital Status: Married  ROS  Review of Systems  Cardiovascular:  Positive for chest pain and dyspnea on exertion.   Objective  Blood pressure (!) 130/90, pulse 62, temperature (!) 97.2 F (36.2 C), temperature source Temporal, resp. rate 17, height 5' 10.5" (1.791 m), weight 102.5 kg, SpO2 98 %. Body mass index is 31.97 kg/m.     01/06/2022    7:24 AM 12/02/2021    8:17 AM 10/07/2021    8:41 AM  Vitals with BMI  Height 5' 10.5" 5' 10.5"   Weight 226 lbs 234 lbs   BMI 10.07 12.19   Systolic 758 832 549  Diastolic 90 86 72  Pulse 62 68 74     Physical Exam Vitals reviewed.  HENT:  Head: Normocephalic and atraumatic.  Cardiovascular:     Rate and Rhythm: Normal rate and regular rhythm.     Pulses: Normal pulses.     Heart sounds: Normal heart sounds. No murmur heard. Pulmonary:     Effort: Pulmonary effort is normal.     Breath sounds: Normal breath sounds.  Abdominal:     General: Bowel sounds are normal.  Musculoskeletal:     Right lower leg: No edema.     Left lower leg: No edema.  Skin:    General: Skin is warm and dry.  Neurological:     Mental Status: He is alert.    Medications and allergies   Allergies  Allergen Reactions   Amlodipine Besylate Swelling    Ankle swelling      Medication    Current Meds  Medication Sig   aspirin EC 81 MG tablet Take 1 tablet (81 mg total) by mouth daily.   Cholecalciferol (VITAMIN D) 50 MCG (2000 UT) tablet Take 2,000 Units by  mouth in the morning and at bedtime.   finasteride (PROSCAR) 5 MG tablet TAKE 1 TABLET (5 MG TOTAL) BY MOUTH DAILY.   hydrochlorothiazide (MICROZIDE) 12.5 MG capsule Take 12.5 mg by mouth every morning.   losartan (COZAAR) 50 MG tablet Take 50 mg by mouth every morning.   MegaRed Omega-3 Krill Oil 350 MG CAPS Take 350 mg by mouth daily.   metoprolol succinate (TOPROL XL) 25 MG 24 hr tablet Take 1 tablet (25 mg total) by mouth daily.   Multiple Vitamins-Minerals (MULTIVITAMIN MEN 50+ PO) Take 1 tablet by mouth daily.   omeprazole (PRILOSEC) 20 MG capsule Take 20 mg by mouth every morning.   simvastatin (ZOCOR) 40 MG tablet Take 40 mg by mouth at bedtime.      Current Facility-Administered Medications:    0.9 %  sodium chloride infusion, 250 mL, Intravenous, PRN, Adrian Prows, MD   [EXPIRED] 0.9% sodium chloride infusion, 3 mL/kg/hr, Intravenous, Continuous, Last Rate: 307.5 mL/hr at 01/06/22 0753, 3 mL/kg/hr at 01/06/22 0753 **FOLLOWED BY** 0.9% sodium chloride infusion, 1 mL/kg/hr, Intravenous, Continuous, Adrian Prows, MD, Last Rate: 102.5 mL/hr at 01/06/22 0853, 1 mL/kg/hr at 01/06/22 0853   sodium chloride flush (NS) 0.9 % injection 3 mL, 3 mL, Intravenous, Q12H, Adrian Prows, MD   sodium chloride flush (NS) 0.9 % injection 3 mL, 3 mL, Intravenous, PRN, Adrian Prows, MD  Laboratory examination:   Lab Results  Component Value Date   NA 140 01/06/2022   K 3.6 01/06/2022   CO2 25 01/06/2022   GLUCOSE 106 (H) 01/06/2022   BUN 9 01/06/2022   CREATININE 0.71 01/06/2022   CALCIUM 9.0 01/06/2022   EGFR 100 01/05/2022   GFRNONAA >60 01/06/2022       Latest Ref Rng & Units 01/06/2022    7:55 AM 01/05/2022   11:14 AM 09/06/2021    3:41 PM  CMP  Glucose 70 - 99 mg/dL 106  86  112   BUN 6 - 20 mg/dL _0 Creatinine 0.61 - 1.24 mg/dL 0.71  0.86  0.70   Sodium 135 - 145 mmol/L 140  138  138   Potassium 3.5 - 5.1 mmol/L 3.6  3.8  3.4   Chloride 98 - 111 mmol/L 107  99  106   CO2 22 -  32 mmol/L _1 Calcium 8.9 - 10.3 mg/dL 9.0  9.3  9.0  Latest Ref Rng & Units 01/06/2022    7:55 AM 01/05/2022   11:14 AM 04/07/2015    9:42 AM  CBC  WBC 4.0 - 10.5 K/uL 5.1  8.2    Hemoglobin 13.0 - 17.0 g/dL 14.3  14.5  16.7   Hematocrit 39.0 - 52.0 % 40.2  42.5  49.0   Platelets 150 - 400 K/uL 162  180     External labs:   Cholesterol, total 121.000 m 11/18/2021 HDL 39.000 mg 11/18/2021 LDL 59.000 mg 11/18/2021 Triglycerides 130.000 m 11/18/2021  Hemoglobin 15.200 g/d 11/18/2021  Creatinine, Serum 0.750 mg/ 11/18/2021 Potassium 4.100 mm 11/18/2021 ALT (SGPT) 37.000 U/L 11/18/2021   Radiology:    Cardiac Studies:   PCV ECHOCARDIOGRAM COMPLETE 12/20/2021  Narrative Echocardiogram 12/20/2021: Left ventricle cavity is normal in size and wall thickness. Cannot exclude mild hypokinesis of mid to apical inferolateral myocardium. Visualization limited due to poor endocardial definition. Normal LV systolic function with EF 54%. Normal diastolic filling pattern. Mild to moderate mitral regurgitation. Mild tricuspid regurgitation. No evidence of pulmonary hypertension.    PCV MYOCARDIAL PERFUSION WO LEXISCAN 12/27/2021  Narrative Exercise nuclear stress test 12/27/2021: There is no ischemia or scar by nuclear perfusion imaging. Diaphragmatic attenuation in the inferior wall and apical thinning noted. The LV is mildly dilated in both rest and stress images.  Overall LV systolic function is moderately abnormal with global hypokinesis. Stress LV EF: 32%. Stress EKG is negative for ischemia. The patient exercised for 8 minutes and 57 seconds of a Bruce protocol, achieving 10.16 METs and 90% MPHR. Stress terminated due to fatigue. There were frequent multifocal PVC in the recovery phase. Rare PAC. The baseline blood pressure was 124/86 mmHg and increased to 190/100 mmHg, which is a hypertensive response to exercise. No previous exam available for comparison. High  risk study due to low LVEF, frequent multifocal PVC in recovery.   EKG:   12/02/2021: Sinus Rhythm. Low voltage in limb leads. Normal R wave progression  Assessment   1.  Angina pectoris 2.  Abnormal nuclear stress test, high risk with severe LV systolic dysfunction and frequent PVCs. 3.  Primary hypertension 4.  Recommendations:   GRANGER CHUI is a 62 y.o.  with HTN and HLD presenting with chest pain suggestive of angina pectoris, he has a high risk nuclear stress test.  Patient has had extensive discussion regarding proceeding with cardiac catheterization, he is only on minimal medical therapy with regard to antianginal medications.  However in view of high risk she admission made to proceed.  Schedule for cardiac catheterization, and possible angioplasty. We discussed regarding risks, benefits, alternatives to this including stress testing, CTA and continued medical therapy. Patient wants to proceed. Understands <1-2% risk of death, stroke, MI, urgent CABG, bleeding, infection, renal failure but not limited to these.    Adrian Prows, MD, [facility] 01/06/2022, 10:23 AM Office: [phone] Fax: [phone] Pager: [phone]

## 2022-01-09 ENCOUNTER — Encounter (HOSPITAL_COMMUNITY): Payer: Self-pay | Admitting: Cardiology

## 2022-01-13 ENCOUNTER — Ambulatory Visit (INDEPENDENT_AMBULATORY_CARE_PROVIDER_SITE_OTHER): Payer: BC Managed Care – PPO | Admitting: Urology

## 2022-01-13 ENCOUNTER — Encounter: Payer: Self-pay | Admitting: Urology

## 2022-01-13 VITALS — BP 137/81 | HR 70

## 2022-01-13 DIAGNOSIS — N138 Other obstructive and reflux uropathy: Secondary | ICD-10-CM | POA: Diagnosis not present

## 2022-01-13 DIAGNOSIS — N401 Enlarged prostate with lower urinary tract symptoms: Secondary | ICD-10-CM

## 2022-01-13 DIAGNOSIS — R3912 Poor urinary stream: Secondary | ICD-10-CM

## 2022-01-13 DIAGNOSIS — R310 Gross hematuria: Secondary | ICD-10-CM | POA: Diagnosis not present

## 2022-01-13 LAB — URINALYSIS, ROUTINE W REFLEX MICROSCOPIC
Bilirubin, UA: NEGATIVE
Glucose, UA: NEGATIVE
Ketones, UA: NEGATIVE
Leukocytes,UA: NEGATIVE
Nitrite, UA: NEGATIVE
Protein,UA: NEGATIVE
Specific Gravity, UA: 1.01 (ref 1.005–1.030)
Urobilinogen, Ur: 0.2 mg/dL (ref 0.2–1.0)
pH, UA: 5.5 (ref 5.0–7.5)

## 2022-01-13 LAB — MICROSCOPIC EXAMINATION
Bacteria, UA: NONE SEEN
Epithelial Cells (non renal): NONE SEEN /hpf (ref 0–10)
WBC, UA: NONE SEEN /hpf (ref 0–5)

## 2022-01-13 MED ORDER — FINASTERIDE 5 MG PO TABS: 5.0000 mg | ORAL_TABLET | Freq: Every day | ORAL | 11 refills | Status: DC

## 2022-01-13 NOTE — Progress Notes (Signed)
post void residual=92 

## 2022-01-13 NOTE — Progress Notes (Signed)
01/13/2022 10:04 AM   Mitchell Bell 1960-02-14 [id number]  Referring provider: Catha Gosselin, MD [address] [place],  [place] [address]  No chief complaint on file.   HPI: Mitchell Bell is a 60yo here for followup for BPH with weak stream. IPSS 7 QOL 1 after Urolift. He has an intermittent weak stream at the end of urination. No straining to urinate. No hematuria since last visit.    PMH: Past Medical History:  Diagnosis Date   ADHD (attention deficit hyperactivity disorder)    Anxiety    Arthritis    At risk for sleep apnea    STOP-BANG= 4      SENT TO PCP 09-10-2014   GERD (gastroesophageal reflux disease)    History of chicken pox    History of kidney stones    History of mumps as a child    Hypertension    Right knee pain    s/p  ORIF right patella fx 09-10-2014 w/ retained hardware   Schatzki's ring     per pt    Scoliosis    BACK PAIN    Surgical History: Past Surgical History:  Procedure Laterality Date   CORONARY ANGIOGRAPHY N/A 01/06/2022   Procedure: CORONARY ANGIOGRAPHY;  Surgeon: Yates Decamp, MD;  [facility]: [facility];  Service: Cardiovascular;  Laterality: N/A;   CYSTOSCOPY WITH INSERTION OF UROLIFT N/A 09/08/2021   Procedure: CYSTOSCOPY WITH INSERTION OF UROLIFT;  Surgeon: Malen Gauze, MD;  Location: [facility];  Service: Urology;  Laterality: N/A;   HARDWARE REMOVAL Right 04/07/2015   Procedure: HARDWARE REMOVAL AND BONE FRAGMENTS,RIGHT PATELLA;  Surgeon: Ollen Gross, MD;  Location: [facility];  Service: Orthopedics;  Laterality: Right;   INGUINAL HERNIA REPAIR Left age 16   KNEE ARTHROSCOPY Bilateral 2003   ORIF LEFT ANKLE FX  1990   retained hardware   ORIF PATELLA Right 09/10/2014   Procedure: OPEN REDUCTION INTERNAL (ORIF) RIGHT PERI  PATELLA FRACTURE;  Surgeon: Ollen Gross, MD;  Location: [facility];  Service: Orthopedics;  Laterality: Right;   TOTAL KNEE ARTHROPLASTY Bilateral 09/17/2013   Procedure: TOTAL KNEE  BILATERAL;  Surgeon: Loanne Drilling, MD;  Location: [facility];  Service: Orthopedics;  Laterality: Bilateral;    Home Medications:  Allergies as of 01/13/2022       Reactions   Amlodipine Besylate Swelling   Ankle swelling         Medication List        Accurate as of January 13, 2022 10:04 AM. If you have any questions, ask your nurse or doctor.          finasteride 5 MG tablet Commonly known as: PROSCAR TAKE 1 TABLET (5 MG TOTAL) BY MOUTH DAILY.   hydrochlorothiazide 12.5 MG capsule Commonly known as: MICROZIDE Take 12.5 mg by mouth every morning.   losartan 50 MG tablet Commonly known as: COZAAR Take 50 mg by mouth every morning.   MegaRed Omega-3 Krill Oil 350 MG Caps Take 350 mg by mouth daily.   metoprolol succinate 25 MG 24 hr tablet Commonly known as: Toprol XL Take 1 tablet (25 mg total) by mouth daily.   MULTIVITAMIN MEN 50+ PO Take 1 tablet by mouth daily.   omeprazole 20 MG capsule Commonly known as: PRILOSEC Take 20 mg by mouth every morning.   simvastatin 40 MG tablet Commonly known as: ZOCOR Take 40 mg by mouth at bedtime.   Vitamin D 50 MCG (2000 UT) tablet Take  2,000 Units by mouth in the morning and at bedtime.        Allergies:  Allergies  Allergen Reactions   Amlodipine Besylate Swelling    Ankle swelling     Family History: Family History  Problem Relation Age of Onset   Heart attack Father     Social History:  reports that he has never smoked. He has never used smokeless tobacco. He reports current alcohol use of about 1.0 standard drink of alcohol per week. He reports that he does not use drugs.  ROS: All other review of systems were reviewed and are negative except what is noted above in HPI  Physical Exam: BP 137/81   Pulse 70   Constitutional:  Alert and oriented, No acute distress. HEENT: [place] AT, moist mucus membranes.  Trachea midline, no masses. Cardiovascular: No clubbing, cyanosis, or edema. Respiratory:  Normal respiratory effort, no increased work of breathing. GI: Abdomen is soft, nontender, nondistended, no abdominal masses GU: No CVA tenderness.  Lymph: No cervical or inguinal lymphadenopathy. Skin: No rashes, bruises or suspicious lesions. Neurologic: Grossly intact, no focal deficits, moving all 4 extremities. Psychiatric: Normal mood and affect.  Laboratory Data: Lab Results  Component Value Date   WBC 5.1 01/06/2022   HGB 14.3 01/06/2022   HCT 40.2 01/06/2022   MCV 89.5 01/06/2022   PLT 162 01/06/2022    Lab Results  Component Value Date   CREATININE 0.71 01/06/2022    No results found for: "PSA"  No results found for: "TESTOSTERONE"  No results found for: "HGBA1C"  Urinalysis    Component Value Date/Time   COLORURINE YELLOW 09/10/2021 1145   APPEARANCEUR Clear 10/07/2021 0847   LABSPEC 1.012 09/10/2021 1145   PHURINE 6.0 09/10/2021 1145   GLUCOSEU Negative 10/07/2021 0847   HGBUR LARGE (A) 09/10/2021 1145   BILIRUBINUR Negative 10/07/2021 0847   KETONESUR NEGATIVE 09/10/2021 1145   PROTEINUR Negative 10/07/2021 0847   PROTEINUR 30 (A) 09/10/2021 1145   UROBILINOGEN 1.0 09/05/2013 0805   NITRITE Negative 10/07/2021 0847   NITRITE NEGATIVE 09/10/2021 1145   LEUKOCYTESUR Negative 10/07/2021 0847   LEUKOCYTESUR TRACE (A) 09/10/2021 1145    Lab Results  Component Value Date   LABMICR See below: 10/07/2021   WBCUA None seen 10/07/2021   LABEPIT None seen 10/07/2021   MUCUS Present 08/19/2021   BACTERIA None seen 10/07/2021    Pertinent Imaging:  No results found for this or any previous visit.  No results found for this or any previous visit.  No results found for this or any previous visit.  No results found for this or any previous visit.  No results found for this or any previous visit.  No valid procedures specified. No results found for this or any previous visit.  No results found for this or any previous visit.   Assessment & Plan:     1. Benign prostatic hyperplasia with urinary obstruction -Patient ding well after Urolift - BLADDER SCAN AMB NON-IMAGING - Urinalysis, Routine w reflex microscopic  2 Weak Urinary stream -Stream improved after urolift  3. Gross hematuria -We have elected to stop the finasteride. If his hematuria returns then we will restart therapy.    No follow-ups on file.  Wilkie Aye, MD  [facility] [place]

## 2022-01-13 NOTE — Patient Instructions (Signed)

## 2022-01-13 NOTE — Progress Notes (Signed)
Patient in office today with denial letter for recent surgery. Appropriate documentation faxed back for insurance appeal today.

## 2022-02-03 ENCOUNTER — Ambulatory Visit: Payer: BC Managed Care – PPO | Admitting: Internal Medicine

## 2022-02-03 ENCOUNTER — Encounter: Payer: Self-pay | Admitting: Internal Medicine

## 2022-02-03 VITALS — BP 142/92 | HR 72 | Ht 70.5 in | Wt 237.2 lb

## 2022-02-03 DIAGNOSIS — I2585 Chronic coronary microvascular dysfunction: Secondary | ICD-10-CM | POA: Diagnosis not present

## 2022-02-03 DIAGNOSIS — E782 Mixed hyperlipidemia: Secondary | ICD-10-CM

## 2022-02-03 DIAGNOSIS — I10 Essential (primary) hypertension: Secondary | ICD-10-CM | POA: Diagnosis not present

## 2022-02-03 MED ORDER — ISOSORBIDE MONONITRATE ER 30 MG PO TB24: 30.0000 mg | ORAL_TABLET | Freq: Every day | ORAL | 3 refills | Status: DC

## 2022-02-03 MED ORDER — ROSUVASTATIN CALCIUM 20 MG PO TABS: 20.0000 mg | ORAL_TABLET | Freq: Every day | ORAL | 3 refills | Status: AC

## 2022-02-03 NOTE — Progress Notes (Signed)
Primary Physician/Referring:  Carin Hock, PA  Patient ID: Mitchell Bell, male    DOB: 1960-02-05, 62 y.o.   MRN: [id number]  Chief Complaint  Patient presents with  . Chest pain of uncertain etiology  . Hospitalization Follow-up   HPI:    Mitchell Bell  is a 62 y.o. male with past medical history significant for hypertension and hyperlipidemia who is here to establish care with cardiology.  Patient recently had a physical with his PCP and it was recommended he checked out by cardiology.  Patient has been having chest pain and dyspnea on exertion with activity recently.  Patient has never had anything like this happen to him before.  He states he is compliant with his medications.  He has never been diagnosed with heart disease however it does run in his family.  He does not smoke or drink alcohol.  Patient has never had a stress test or an echocardiogram in the past.  He denies palpitations, diaphoresis, syncope, claudication, edema, orthopnea, PND.  Past Medical History:  Diagnosis Date  . ADHD (attention deficit hyperactivity disorder)   . Anxiety   . Arthritis   . At risk for sleep apnea    STOP-BANG= 4      SENT TO PCP 09-10-2014  . GERD (gastroesophageal reflux disease)   . History of chicken pox   . History of kidney stones   . History of mumps as a child   . Hypertension   . Right knee pain    s/p  ORIF right patella fx 09-10-2014 w/ retained hardware  . Schatzki's ring     per pt   . Scoliosis    BACK PAIN   Past Surgical History:  Procedure Laterality Date  . CORONARY ANGIOGRAPHY N/A 01/06/2022   Procedure: CORONARY ANGIOGRAPHY;  Surgeon: Yates Decamp, MD;  [facility]: [facility];  Service: Cardiovascular;  Laterality: N/A;  . CYSTOSCOPY WITH INSERTION OF UROLIFT N/A 09/08/2021   Procedure: CYSTOSCOPY WITH INSERTION OF UROLIFT;  Surgeon: Malen Gauze, MD;  Location: [facility];  Service: Urology;  Laterality: N/A;  . HARDWARE REMOVAL Right 04/07/2015    Procedure: HARDWARE REMOVAL AND BONE FRAGMENTS,RIGHT PATELLA;  Surgeon: Ollen Gross, MD;  Location: [facility] [place];  Service: Orthopedics;  Laterality: Right;  . INGUINAL HERNIA REPAIR Left age 47  . KNEE ARTHROSCOPY Bilateral 2003  . ORIF LEFT ANKLE FX  1990   retained hardware  . ORIF PATELLA Right 09/10/2014   Procedure: OPEN REDUCTION INTERNAL (ORIF) RIGHT PERI  PATELLA FRACTURE;  Surgeon: Ollen Gross, MD;  Location: [facility];  Service: Orthopedics;  Laterality: Right;  . TOTAL KNEE ARTHROPLASTY Bilateral 09/17/2013   Procedure: TOTAL KNEE BILATERAL;  Surgeon: Loanne Drilling, MD;  Location: [facility];  Service: Orthopedics;  Laterality: Bilateral;   Family History  Problem Relation Age of Onset  . Heart attack Father     Social History   Tobacco Use  . Smoking status: Never  . Smokeless tobacco: Never  Substance Use Topics  . Alcohol use: Yes    Alcohol/week: 1.0 standard drink of alcohol    Types: 1 Shots of liquor per week    Comment: 1 DRINK A WEEK/social   Marital Status: Married  ROS  Review of Systems  Cardiovascular:  Negative for chest pain and dyspnea on exertion.   Objective  Blood pressure (!) 142/92, pulse 72, height 5' 10.5" (1.791 m), weight 237 lb 3.2 oz (107.6 kg), SpO2 97 %.  Body mass index is 33.55 kg/m.     02/03/2022    9:24 AM 01/13/2022    9:38 AM 01/06/2022    1:10 PM  Vitals with BMI  Height 5' 10.5"    Weight 237 lbs 3 oz    BMI 123456    Systolic [id number]  Diastolic 92 81 73  Pulse 72 70 65     Physical Exam Vitals reviewed.  HENT:     Head: Normocephalic and atraumatic.  Cardiovascular:     Rate and Rhythm: Normal rate and regular rhythm.     Pulses: Normal pulses.     Heart sounds: Normal heart sounds. No murmur heard. Pulmonary:     Effort: Pulmonary effort is normal.     Breath sounds: Normal breath sounds.  Abdominal:     General: Bowel sounds are normal.  Musculoskeletal:     Right lower leg: No edema.     Left  lower leg: No edema.  Skin:    General: Skin is warm and dry.  Neurological:     Mental Status: He is alert.    Medications and allergies   Allergies  Allergen Reactions  . Amlodipine Besylate Swelling    Ankle swelling      Medication list after today's encounter   Current Outpatient Medications:  .  Cholecalciferol (VITAMIN D) 50 MCG (2000 UT) tablet, Take 2,000 Units by mouth in the morning and at bedtime., Disp: , Rfl:  .  hydrochlorothiazide (MICROZIDE) 12.5 MG capsule, Take 12.5 mg by mouth every morning., Disp: , Rfl:  .  losartan (COZAAR) 50 MG tablet, Take 50 mg by mouth every morning., Disp: , Rfl:  .  MegaRed Omega-3 Krill Oil 350 MG CAPS, Take 350 mg by mouth daily., Disp: , Rfl:  .  metoprolol succinate (TOPROL XL) 25 MG 24 hr tablet, Take 1 tablet (25 mg total) by mouth daily., Disp: 30 tablet, Rfl: 6 .  Multiple Vitamins-Minerals (MULTIVITAMIN MEN 50+ PO), Take 1 tablet by mouth daily., Disp: , Rfl:  .  omeprazole (PRILOSEC) 20 MG capsule, Take 20 mg by mouth every morning., Disp: , Rfl:  .  simvastatin (ZOCOR) 40 MG tablet, Take 40 mg by mouth at bedtime., Disp: , Rfl:  .  finasteride (PROSCAR) 5 MG tablet, Take 1 tablet (5 mg total) by mouth daily. (Patient not taking: Reported on 02/03/2022), Disp: 30 tablet, Rfl: 11  Laboratory examination:   Lab Results  Component Value Date   NA 140 01/06/2022   K 3.6 01/06/2022   CO2 25 01/06/2022   GLUCOSE 106 (H) 01/06/2022   BUN 9 01/06/2022   CREATININE 0.71 01/06/2022   CALCIUM 9.0 01/06/2022   EGFR 100 01/05/2022   GFRNONAA >60 01/06/2022       Latest Ref Rng & Units 01/06/2022    7:55 AM 01/05/2022   11:14 AM 09/06/2021    3:41 PM  CMP  Glucose 70 - 99 mg/dL 106  86  112   BUN 6 - 20 mg/dL 9  11  11    Creatinine 0.61 - 1.24 mg/dL 0.71  0.86  0.70   Sodium 135 - 145 mmol/L 140  138  138   Potassium 3.5 - 5.1 mmol/L 3.6  3.8  3.4   Chloride 98 - 111 mmol/L 107  99  106   CO2 22 - 32 mmol/L 25  23  26     Calcium 8.9 - 10.3 mg/dL 9.0  9.3  9.0  Latest Ref Rng & Units 01/06/2022    7:55 AM 01/05/2022   11:14 AM 04/07/2015    9:42 AM  CBC  WBC 4.0 - 10.5 K/uL 5.1  8.2    Hemoglobin 13.0 - 17.0 g/dL 14.3  14.5  16.7   Hematocrit 39.0 - 52.0 % 40.2  42.5  49.0   Platelets 150 - 400 K/uL 162  180      Lipid Panel No results for input(s): "CHOL", "TRIG", "[place]", "VLDL", "HDL", "CHOLHDL", "LDLDIRECT" in the last 8760 hours.  HEMOGLOBIN A1C No results found for: "HGBA1C", "MPG" TSH No results for input(s): "TSH" in the last 8760 hours.  External labs:     Radiology:    Cardiac Studies:   Left Heart Catheterization 01/06/22:  RCA: It is a large vessel, dominant.  Gives origin to large PDA and moderate-sized PL branch.  Slow flow is evident in the RCA suggestive of microvascular disease. LM: Short, smooth and normal. LCx: Very smooth and normal, very tiny OM1 large OM 2 and OM 3.  AV groove circumflex is small. LAD: Smooth and normal.  Gives origin to small diagonal 1 and a very large diagonal 2.  Septal perforator 2 is also large.    NORMAL CORONARY ARTERIES   Impression: Findings suggestive of microvascular disease.  No indication for aspirin.  Continue statins and blood pressure control.  Normal coronaries.   Exercise nuclear stress test 12/27/2021: There is no ischemia or scar by nuclear perfusion imaging. Diaphragmatic attenuation in the inferior wall and apical thinning noted.  The LV is mildly dilated in both rest and stress images.  Overall LV systolic function is moderately abnormal with global hypokinesis. Stress LV EF: 32%.  Stress EKG is negative for ischemia. The patient exercised for 8 minutes and 57 seconds of a Bruce protocol, achieving 10.16 METs and 90% MPHR. Stress terminated due to fatigue.  There were frequent multifocal PVC in the recovery phase. Rare PAC. The baseline blood pressure was 124/86 mmHg and increased to 190/100 mmHg, which is a  hypertensive response to exercise. No previous exam available for comparison. High risk study due to low LVEF, frequent multifocal PVC in recovery.    Echocardiogram 12/20/2021:  Left ventricle cavity is normal in size and wall thickness. Cannot exclude  mild hypokinesis of mid to apical inferolateral myocardium. Visualization  limited due to poor endocardial definition.  Normal LV systolic function with EF 54%.  Normal diastolic filling pattern.  Mild to moderate mitral regurgitation.  Mild tricuspid regurgitation.  No evidence of pulmonary hypertension.    EKG:   12/02/2021: Sinus Rhythm. Low voltage in limb leads. Normal R wave progression  Assessment     ICD-10-CM   1. Essential hypertension  I10     2. Mixed hyperlipidemia  E78.2     3. Cardiac microvascular disease  I25.85        No orders of the defined types were placed in this encounter.   No orders of the defined types were placed in this encounter.   There are no discontinued medications.    Recommendations:   Mitchell Bell is a 62 y.o.  with HTN and HLD presenting with chest pain   Chest pain of uncertain etiology, suggestive of angina Echocardiogram and stress test ordered Patient instructed not to do heavy lifting, heavy exertional activity, swimming until evaluation is complete.  Patient instructed to call if symptoms worse or to go to the ED for further evaluation.   Essential hypertension Continue  current cardiac medications. Encourage low-sodium diet, less than 2000 mg daily. Follow-up in 2 months or sooner if needed.   Mixed hyperlipidemia Continue statin      Floydene Flock, DO, [facility]  02/03/2022, 9:33 AM Office: [phone] Pager: [phone]

## 2022-02-10 ENCOUNTER — Telehealth: Payer: Self-pay

## 2022-02-10 NOTE — Telephone Encounter (Signed)
Patient left a voice message 02-09-2022.  [employer] is denying the August procedure done in 2023.  Needing a call back:  [phone]  Thanks, Helene Kelp

## 2022-02-13 NOTE — Telephone Encounter (Signed)
Returned call to patient- per patient [employer] will re-evaluate claim as his prostate volume as documented in [facility] chart note of 30gm prostate volume. Patient will return call to office after he hears back from [facility]

## 2022-02-17 ENCOUNTER — Encounter: Payer: Self-pay | Admitting: Internal Medicine

## 2022-02-17 ENCOUNTER — Ambulatory Visit: Payer: BC Managed Care – PPO | Admitting: Internal Medicine

## 2022-02-17 VITALS — BP 126/80 | HR 70 | Ht 70.5 in | Wt 239.4 lb

## 2022-02-17 DIAGNOSIS — E669 Obesity, unspecified: Secondary | ICD-10-CM

## 2022-02-17 DIAGNOSIS — E782 Mixed hyperlipidemia: Secondary | ICD-10-CM

## 2022-02-17 DIAGNOSIS — R079 Chest pain, unspecified: Secondary | ICD-10-CM

## 2022-02-17 DIAGNOSIS — I10 Essential (primary) hypertension: Secondary | ICD-10-CM | POA: Diagnosis not present

## 2022-02-17 DIAGNOSIS — I2585 Chronic coronary microvascular dysfunction: Secondary | ICD-10-CM

## 2022-02-17 MED ORDER — METOPROLOL SUCCINATE ER 25 MG PO TB24: 25.0000 mg | ORAL_TABLET | Freq: Two times a day (BID) | ORAL | 6 refills | Status: AC

## 2022-02-17 NOTE — Progress Notes (Unsigned)
Primary Physician/Referring:  Carin Hock, PA  Patient ID: Mitchell Bell, male    DOB: 17-Feb-1960, 62 y.o.   MRN: [id number]  Chief Complaint  Patient presents with   Chest Pain   HPI:    Mitchell Bell  is a 62 y.o. male with past medical history significant for hypertension and hyperlipidemia who is here for a follow-up visit. He is still experiencing chest pain from his microvascular disease. Imdur has not helped control this but he thinks the toprol is helping. He is agreeable to doubling the toprol and stopping the imdur since it is not helping. Patient wants to lose some weight and is requesting referral for nutritionist or dietician. He denies palpitations, diaphoresis, syncope, claudication, edema, orthopnea, PND.  Past Medical History:  Diagnosis Date   ADHD (attention deficit hyperactivity disorder)    Anxiety    Arthritis    At risk for sleep apnea    STOP-BANG= 4      SENT TO PCP 09-10-2014   GERD (gastroesophageal reflux disease)    History of chicken pox    History of kidney stones    History of mumps as a child    Hypertension    Right knee pain    s/p  ORIF right patella fx 09-10-2014 w/ retained hardware   Schatzki's ring     per pt    Scoliosis    BACK PAIN   Past Surgical History:  Procedure Laterality Date   CORONARY ANGIOGRAPHY N/A 01/06/2022   Procedure: CORONARY ANGIOGRAPHY;  Surgeon: Yates Decamp, MD;  Location: [facility];  Service: Cardiovascular;  Laterality: N/A;   CYSTOSCOPY WITH INSERTION OF UROLIFT N/A 09/08/2021   Procedure: CYSTOSCOPY WITH INSERTION OF UROLIFT;  Surgeon: Malen Gauze, MD;  Location: [facility];  Service: Urology;  Laterality: N/A;   HARDWARE REMOVAL Right 04/07/2015   Procedure: HARDWARE REMOVAL AND BONE FRAGMENTS,RIGHT PATELLA;  Surgeon: Ollen Gross, MD;  Location: [facility] [place];  Service: Orthopedics;  Laterality: Right;   INGUINAL HERNIA REPAIR Left age 40   KNEE ARTHROSCOPY Bilateral 2003    ORIF LEFT ANKLE FX  1990   retained hardware   ORIF PATELLA Right 09/10/2014   Procedure: OPEN REDUCTION INTERNAL (ORIF) RIGHT PERI  PATELLA FRACTURE;  Surgeon: Ollen Gross, MD;  Location: [facility];  Service: Orthopedics;  Laterality: Right;   TOTAL KNEE ARTHROPLASTY Bilateral 09/17/2013   Procedure: TOTAL KNEE BILATERAL;  Surgeon: Loanne Drilling, MD;  Location: [facility];  Service: Orthopedics;  Laterality: Bilateral;   Family History  Problem Relation Age of Onset   Heart attack Father     Social History   Tobacco Use   Smoking status: Never   Smokeless tobacco: Never  Substance Use Topics   Alcohol use: Yes    Alcohol/week: 1.0 standard drink of alcohol    Types: 1 Shots of liquor per week    Comment: 1 DRINK A WEEK/social   Marital Status: Married  ROS  Review of Systems  Cardiovascular:  Positive for chest pain. Negative for dyspnea on exertion.   Objective  Blood pressure 126/80, pulse 70, height 5' 10.5" (1.791 m), weight 239 lb 6.4 oz (108.6 kg), SpO2 95 %. Body mass index is 33.86 kg/m.     02/17/2022    1:38 PM 02/03/2022    9:24 AM 01/13/2022    9:38 AM  Vitals with BMI  Height 5' 10.5" 5' 10.5"   Weight 239 lbs 6 oz 237 lbs  3 oz   BMI 62.95 28.41   Systolic 324 401 027  Diastolic 80 92 81  Pulse 70 72 70     Physical Exam Vitals reviewed.  HENT:     Head: Normocephalic and atraumatic.  Cardiovascular:     Rate and Rhythm: Normal rate and regular rhythm.     Pulses: Normal pulses.     Heart sounds: Normal heart sounds. No murmur heard. Pulmonary:     Effort: Pulmonary effort is normal.     Breath sounds: Normal breath sounds.  Abdominal:     General: Bowel sounds are normal.  Musculoskeletal:     Right lower leg: No edema.     Left lower leg: No edema.  Skin:    General: Skin is warm and dry.  Neurological:     Mental Status: He is alert.     Medications and allergies   Allergies  Allergen Reactions   Amlodipine Besylate Swelling    Ankle  swelling      Medication list after today's encounter   Current Outpatient Medications:    Cholecalciferol (VITAMIN D) 50 MCG (2000 UT) tablet, Take 2,000 Units by mouth in the morning and at bedtime., Disp: , Rfl:    hydrochlorothiazide (MICROZIDE) 12.5 MG capsule, Take 12.5 mg by mouth every morning., Disp: , Rfl:    losartan (COZAAR) 50 MG tablet, Take 50 mg by mouth every morning., Disp: , Rfl:    MegaRed Omega-3 Krill Oil 350 MG CAPS, Take 350 mg by mouth daily., Disp: , Rfl:    metoprolol succinate (TOPROL XL) 25 MG 24 hr tablet, Take 1 tablet (25 mg total) by mouth in the morning and at bedtime., Disp: 60 tablet, Rfl: 6   omeprazole (PRILOSEC) 20 MG capsule, Take 20 mg by mouth every morning., Disp: , Rfl:    rosuvastatin (CRESTOR) 20 MG tablet, Take 1 tablet (20 mg total) by mouth at bedtime., Disp: 90 tablet, Rfl: 3  Laboratory examination:   Lab Results  Component Value Date   NA 140 01/06/2022   K 3.6 01/06/2022   CO2 25 01/06/2022   GLUCOSE 106 (H) 01/06/2022   BUN 9 01/06/2022   CREATININE 0.71 01/06/2022   CALCIUM 9.0 01/06/2022   EGFR 100 01/05/2022   GFRNONAA >60 01/06/2022       Latest Ref Rng & Units 01/06/2022    7:55 AM 01/05/2022   11:14 AM 09/06/2021    3:41 PM  CMP  Glucose 70 - 99 mg/dL 106  86  112   BUN 6 - 20 mg/dL 9  11  11    Creatinine 0.61 - 1.24 mg/dL 0.71  0.86  0.70   Sodium 135 - 145 mmol/L 140  138  138   Potassium 3.5 - 5.1 mmol/L 3.6  3.8  3.4   Chloride 98 - 111 mmol/L 107  99  106   CO2 22 - 32 mmol/L 25  23  26    Calcium 8.9 - 10.3 mg/dL 9.0  9.3  9.0       Latest Ref Rng & Units 01/06/2022    7:55 AM 01/05/2022   11:14 AM 04/07/2015    9:42 AM  CBC  WBC 4.0 - 10.5 K/uL 5.1  8.2    Hemoglobin 13.0 - 17.0 g/dL 14.3  14.5  16.7   Hematocrit 39.0 - 52.0 % 40.2  42.5  49.0   Platelets 150 - 400 K/uL 162  180      Lipid Panel No  results for input(s): "CHOL", "TRIG", "[place]", "VLDL", "HDL", "CHOLHDL", "LDLDIRECT" in the last  8760 hours.  HEMOGLOBIN A1C No results found for: "HGBA1C", "MPG" TSH No results for input(s): "TSH" in the last 8760 hours.  External labs:     Radiology:    Cardiac Studies:   Left Heart Catheterization 01/06/22:  RCA: It is a large vessel, dominant.  Gives origin to large PDA and moderate-sized PL branch.  Slow flow is evident in the RCA suggestive of microvascular disease. LM: Short, smooth and normal. LCx: Very smooth and normal, very tiny OM1 large OM 2 and OM 3.  AV groove circumflex is small. LAD: Smooth and normal.  Gives origin to small diagonal 1 and a very large diagonal 2.  Septal perforator 2 is also large.    NORMAL CORONARY ARTERIES   Impression: Findings suggestive of microvascular disease.  No indication for aspirin.  Continue statins and blood pressure control.  Normal coronaries.   Exercise nuclear stress test 12/27/2021: There is no ischemia or scar by nuclear perfusion imaging. Diaphragmatic attenuation in the inferior wall and apical thinning noted.  The LV is mildly dilated in both rest and stress images.  Overall LV systolic function is moderately abnormal with global hypokinesis. Stress LV EF: 32%.  Stress EKG is negative for ischemia. The patient exercised for 8 minutes and 57 seconds of a Bruce protocol, achieving 10.16 METs and 90% MPHR. Stress terminated due to fatigue.  There were frequent multifocal PVC in the recovery phase. Rare PAC. The baseline blood pressure was 124/86 mmHg and increased to 190/100 mmHg, which is a hypertensive response to exercise. No previous exam available for comparison. High risk study due to low LVEF, frequent multifocal PVC in recovery.    Echocardiogram 12/20/2021:  Left ventricle cavity is normal in size and wall thickness. Cannot exclude  mild hypokinesis of mid to apical inferolateral myocardium. Visualization  limited due to poor endocardial definition.  Normal LV systolic function with EF 54%.  Normal  diastolic filling pattern.  Mild to moderate mitral regurgitation.  Mild tricuspid regurgitation.  No evidence of pulmonary hypertension.    EKG:   12/02/2021: Sinus Rhythm. Low voltage in limb leads. Normal R wave progression  02/17/2022: sinus rhythm, normal axis, normal R wave progression. No ischemia  Assessment     ICD-10-CM   1. Chest pain of uncertain etiology  [id number] EKG 12-Lead    Amb ref to Medical Nutrition Therapy-MNT    2. Obesity (BMI 30.0-34.9)  E66.9 Amb ref to Medical Nutrition Therapy-MNT    3. Cardiac microvascular disease  I25.85     4. Mixed hyperlipidemia  E78.2     5. Essential hypertension  I10        Orders Placed This Encounter  Procedures   Amb ref to Medical Nutrition Therapy-MNT    Referral Priority:   Routine    Referral Type:   Consultation    Referral Reason:   Specialty Services Required    Requested Specialty:   Nutrition    Number of Visits Requested:   1   EKG 12-Lead     Meds ordered this encounter  Medications   metoprolol succinate (TOPROL XL) 25 MG 24 hr tablet    Sig: Take 1 tablet (25 mg total) by mouth in the morning and at bedtime.    Dispense:  60 tablet    Refill:  6    Medications Discontinued During This Encounter  Medication Reason   finasteride (PROSCAR) 5 MG  tablet Patient Preference   Multiple Vitamins-Minerals (MULTIVITAMIN MEN 50+ PO) Patient Preference   isosorbide mononitrate (IMDUR) 30 MG 24 hr tablet    metoprolol succinate (TOPROL XL) 25 MG 24 hr tablet       Recommendations:   KASCH BORQUEZ is a 62 y.o.  with HTN and HLD     Essential hypertension Continue current cardiac medications. Encourage low-sodium diet, less than 2000 mg daily.   Mixed hyperlipidemia Continue Crestor   Cardiac microvascular disease Continue with medical management for BP and lipid control He is still complaining of chest pain, will d/c Imdur and double Toprol   Obesity (BMI 30.0-34.9) Patient requesting  referral to nutritionist  Follow-up in 3-6 months or sooner if needed    Floydene Flock, DO, [facility]  02/18/2022, 3:42 PM Office: [phone] Pager: [phone]

## 2022-02-28 ENCOUNTER — Telehealth: Payer: Self-pay

## 2022-02-28 NOTE — Telephone Encounter (Signed)
I tried to reach out to [facility] at [phone] in regards to claim appeal.  Unable to speak to a representative after multiple attempts.  Will try again at a later time.

## 2022-02-28 NOTE — Telephone Encounter (Signed)
I spoke with Mitchell Bell with [employer] in regards to coverage for pts Urolift procedure.  She states she does not deal with claims for the PPO Plan.  I inquired about the number I need to call to resolve the issue.  The claim would have be filed with [facility] [facility] claims.  Mitchell Bell provided me with the number to call for local plan claims. [phone]

## 2022-04-10 ENCOUNTER — Encounter: Payer: Self-pay | Admitting: Internal Medicine

## 2022-04-10 ENCOUNTER — Ambulatory Visit: Payer: BC Managed Care – PPO | Admitting: Internal Medicine

## 2022-04-10 VITALS — BP 141/82 | HR 62 | Resp 16 | Ht 70.5 in | Wt 239.0 lb

## 2022-04-10 DIAGNOSIS — G4733 Obstructive sleep apnea (adult) (pediatric): Secondary | ICD-10-CM | POA: Diagnosis not present

## 2022-04-10 DIAGNOSIS — E782 Mixed hyperlipidemia: Secondary | ICD-10-CM

## 2022-04-10 DIAGNOSIS — I2585 Chronic coronary microvascular dysfunction: Secondary | ICD-10-CM

## 2022-04-10 DIAGNOSIS — I10 Essential (primary) hypertension: Secondary | ICD-10-CM | POA: Diagnosis not present

## 2022-04-10 MED ORDER — LOSARTAN POTASSIUM 25 MG PO TABS: 25.0000 mg | ORAL_TABLET | Freq: Every day | ORAL | 3 refills | Status: AC

## 2022-04-10 NOTE — Progress Notes (Unsigned)
Primary Physician/Referring:  Amelia Jo, PA  Patient ID: Mitchell Bell, male    DOB: 1960-09-11, 62 y.o.   MRN: [id number]  No chief complaint on file.  HPI:    Mitchell Bell  is a 62 y.o. male with past medical history significant for hypertension and hyperlipidemia who is here for a follow-up visit. He is still experiencing chest pain from his microvascular disease. Imdur has not helped control this but he thinks the toprol is helping. He is agreeable to doubling the toprol and stopping the imdur since it is not helping. Patient wants to lose some weight and is requesting referral for nutritionist or dietician. He denies palpitations, diaphoresis, syncope, claudication, edema, orthopnea, PND.  Past Medical History:  Diagnosis Date   ADHD (attention deficit hyperactivity disorder)    Anxiety    Arthritis    At risk for sleep apnea    STOP-BANG= 4      SENT TO PCP 09-10-2014   GERD (gastroesophageal reflux disease)    History of chicken pox    History of kidney stones    History of mumps as a child    Hypertension    Right knee pain    s/p  ORIF right patella fx 09-10-2014 w/ retained hardware   Schatzki's ring     per pt    Scoliosis    BACK PAIN   Past Surgical History:  Procedure Laterality Date   CORONARY ANGIOGRAPHY N/A 01/06/2022   Procedure: CORONARY ANGIOGRAPHY;  Surgeon: Adrian Prows, MD;  Location: [facility];  Service: Cardiovascular;  Laterality: N/A;   CYSTOSCOPY WITH INSERTION OF UROLIFT N/A 09/08/2021   Procedure: CYSTOSCOPY WITH INSERTION OF UROLIFT;  Surgeon: Cleon Gustin, MD;  Location: [facility];  Service: Urology;  Laterality: N/A;   HARDWARE REMOVAL Right 04/07/2015   Procedure: HARDWARE REMOVAL AND BONE FRAGMENTS,RIGHT PATELLA;  Surgeon: Gaynelle Arabian, MD;  Location: [place];  Service: Orthopedics;  Laterality: Right;   INGUINAL HERNIA REPAIR Left age 54   KNEE ARTHROSCOPY Bilateral 2003   ORIF LEFT ANKLE FX  1990    retained hardware   ORIF PATELLA Right 09/10/2014   Procedure: OPEN REDUCTION INTERNAL (ORIF) RIGHT PERI  PATELLA FRACTURE;  Surgeon: Gaynelle Arabian, MD;  Location: [facility];  Service: Orthopedics;  Laterality: Right;   TOTAL KNEE ARTHROPLASTY Bilateral 09/17/2013   Procedure: TOTAL KNEE BILATERAL;  Surgeon: Gearlean Alf, MD;  Location: [facility];  Service: Orthopedics;  Laterality: Bilateral;   Family History  Problem Relation Age of Onset   Heart attack Father     Social History   Tobacco Use   Smoking status: Never   Smokeless tobacco: Never  Substance Use Topics   Alcohol use: Yes    Alcohol/week: 1.0 standard drink of alcohol    Types: 1 Shots of liquor per week    Comment: 1 DRINK A WEEK/social   Marital Status: Married  ROS  Review of Systems  Cardiovascular:  Positive for chest pain. Negative for dyspnea on exertion.   Objective  Blood pressure (!) 141/82, pulse 62, resp. rate 16, height 5' 10.5" (1.791 m), weight 239 lb (108.4 kg), SpO2 95 %. Body mass index is 33.81 kg/m.     04/10/2022    3:16 PM 02/17/2022    1:38 PM 02/03/2022    9:24 AM  Vitals with BMI  Height 5' 10.5" 5' 10.5" 5' 10.5"  Weight 239 lbs 239 lbs 6 oz 237 lbs 3  oz  BMI 33.8 [id number] [id number]  Systolic Q000111Q [id number] [id number]  Diastolic 82 80 92  Pulse 62 70 72     Physical Exam Vitals reviewed.  HENT:     Head: Normocephalic and atraumatic.  Cardiovascular:     Rate and Rhythm: Normal rate and regular rhythm.     Pulses: Normal pulses.     Heart sounds: Normal heart sounds. No murmur heard. Pulmonary:     Effort: Pulmonary effort is normal.     Breath sounds: Normal breath sounds.  Abdominal:     General: Bowel sounds are normal.  Musculoskeletal:     Right lower leg: No edema.     Left lower leg: No edema.  Skin:    General: Skin is warm and dry.  Neurological:     Mental Status: He is alert.     Medications and allergies   Allergies  Allergen Reactions   Amlodipine Besylate Swelling    Ankle  swelling      Medication list after today's encounter   Current Outpatient Medications:    Cholecalciferol (VITAMIN D) 50 MCG (2000 UT) tablet, Take 4,000 Units by mouth in the morning and at bedtime., Disp: , Rfl:    losartan (COZAAR) 25 MG tablet, Take 1 tablet (25 mg total) by mouth at bedtime., Disp: 90 tablet, Rfl: 3   MegaRed Omega-3 Krill Oil 350 MG CAPS, Take 350 mg by mouth daily., Disp: , Rfl:    metoprolol succinate (TOPROL XL) 25 MG 24 hr tablet, Take 1 tablet (25 mg total) by mouth in the morning and at bedtime., Disp: 60 tablet, Rfl: 6   omeprazole (PRILOSEC) 20 MG capsule, Take 20 mg by mouth every morning., Disp: , Rfl:    rosuvastatin (CRESTOR) 20 MG tablet, Take 1 tablet (20 mg total) by mouth at bedtime., Disp: 90 tablet, Rfl: 3  Laboratory examination:   Lab Results  Component Value Date   NA 140 01/06/2022   K 3.6 01/06/2022   CO2 25 01/06/2022   GLUCOSE 106 (H) 01/06/2022   BUN 9 01/06/2022   CREATININE 0.71 01/06/2022   CALCIUM 9.0 01/06/2022   EGFR 100 01/05/2022   GFRNONAA >60 01/06/2022       Latest Ref Rng & Units 01/06/2022    7:55 AM 01/05/2022   11:14 AM 09/06/2021    3:41 PM  CMP  Glucose 70 - 99 mg/dL 106  86  112   BUN 6 - 20 mg/dL 9  11  11    Creatinine 0.61 - 1.24 mg/dL 0.71  0.86  0.70   Sodium 135 - 145 mmol/L 140  138  138   Potassium 3.5 - 5.1 mmol/L 3.6  3.8  3.4   Chloride 98 - 111 mmol/L 107  99  106   CO2 22 - 32 mmol/L 25  23  26    Calcium 8.9 - 10.3 mg/dL 9.0  9.3  9.0       Latest Ref Rng & Units 01/06/2022    7:55 AM 01/05/2022   11:14 AM 04/07/2015    9:42 AM  CBC  WBC 4.0 - 10.5 K/uL 5.1  8.2    Hemoglobin 13.0 - 17.0 g/dL 14.3  14.5  16.7   Hematocrit 39.0 - 52.0 % 40.2  42.5  49.0   Platelets 150 - 400 K/uL 162  180      Lipid Panel No results for input(s): "CHOL", "TRIG", "LDLCALC", "VLDL", "HDL", "CHOLHDL", "LDLDIRECT" in the last 8760 hours.  HEMOGLOBIN A1C No results found for: "HGBA1C", "MPG" TSH No  results for input(s): "TSH" in the last 8760 hours.  External labs:     Radiology:    Cardiac Studies:   Left Heart Catheterization 01/06/22:  RCA: It is a large vessel, dominant.  Gives origin to large PDA and moderate-sized PL branch.  Slow flow is evident in the RCA suggestive of microvascular disease. LM: Short, smooth and normal. LCx: Very smooth and normal, very tiny OM1 large OM 2 and OM 3.  AV groove circumflex is small. LAD: Smooth and normal.  Gives origin to small diagonal 1 and a very large diagonal 2.  Septal perforator 2 is also large.    NORMAL CORONARY ARTERIES   Impression: Findings suggestive of microvascular disease.  No indication for aspirin.  Continue statins and blood pressure control.  Normal coronaries.   Exercise nuclear stress test 12/27/2021: There is no ischemia or scar by nuclear perfusion imaging. Diaphragmatic attenuation in the inferior wall and apical thinning noted.  The LV is mildly dilated in both rest and stress images.  Overall LV systolic function is moderately abnormal with global hypokinesis. Stress LV EF: 32%.  Stress EKG is negative for ischemia. The patient exercised for 8 minutes and 57 seconds of a Bruce protocol, achieving 10.16 METs and 90% MPHR. Stress terminated due to fatigue.  There were frequent multifocal PVC in the recovery phase. Rare PAC. The baseline blood pressure was 124/86 mmHg and increased to 190/100 mmHg, which is a hypertensive response to exercise. No previous exam available for comparison. High risk study due to low LVEF, frequent multifocal PVC in recovery.    Echocardiogram 12/20/2021:  Left ventricle cavity is normal in size and wall thickness. Cannot exclude  mild hypokinesis of mid to apical inferolateral myocardium. Visualization  limited due to poor endocardial definition.  Normal LV systolic function with EF 54%.  Normal diastolic filling pattern.  Mild to moderate mitral regurgitation.  Mild tricuspid  regurgitation.  No evidence of pulmonary hypertension.    EKG:   12/02/2021: Sinus Rhythm. Low voltage in limb leads. Normal R wave progression  02/17/2022: sinus rhythm, normal axis, normal R wave progression. No ischemia  Assessment     ICD-10-CM   1. Cardiac microvascular disease  I25.85     2. Essential hypertension  I10     3. Mixed hyperlipidemia  E78.2     4. OSA (obstructive sleep apnea)  G47.33 Ambulatory referral to Sleep Studies       Orders Placed This Encounter  Procedures   Ambulatory referral to Sleep Studies    Referral Priority:   Routine    Referral Type:   Consultation    Referral Reason:   Specialty Services Required    Referred to Provider:   Dohmeier, Asencion Partridge, MD    Number of Visits Requested:   1     Meds ordered this encounter  Medications   losartan (COZAAR) 25 MG tablet    Sig: Take 1 tablet (25 mg total) by mouth at bedtime.    Dispense:  90 tablet    Refill:  3    Medications Discontinued During This Encounter  Medication Reason   hydrochlorothiazide (MICROZIDE) 12.5 MG capsule    losartan (COZAAR) 50 MG tablet        Recommendations:   Mitchell Bell is a 62 y.o.  with HTN and HLD     Essential hypertension Continue current cardiac medications. Encourage low-sodium diet, less than 2000 mg  daily.   Mixed hyperlipidemia Continue Crestor   Cardiac microvascular disease Continue with medical management for BP and lipid control He is still complaining of chest pain, will d/c Imdur and double Toprol   Obesity (BMI 30.0-34.9) Patient requesting referral to nutritionist  Follow-up in 3-6 months or sooner if needed    Floydene Flock, DO, [facility]  04/10/2022, 3:49 PM Office: [phone] Pager: [phone]

## 2022-04-17 ENCOUNTER — Other Ambulatory Visit: Payer: Self-pay

## 2022-04-17 DIAGNOSIS — R079 Chest pain, unspecified: Secondary | ICD-10-CM

## 2022-04-23 ENCOUNTER — Encounter: Payer: Self-pay | Admitting: Urology

## 2022-04-23 ENCOUNTER — Encounter: Payer: Self-pay | Admitting: Internal Medicine

## 2022-04-24 ENCOUNTER — Telehealth: Payer: Self-pay

## 2022-04-24 NOTE — Telephone Encounter (Signed)
CS patient

## 2022-04-24 NOTE — Telephone Encounter (Signed)
From patient.

## 2022-04-24 NOTE — Telephone Encounter (Signed)
Patient called requesting to speak to either of you regarding a procedure bill. He advised you were aware of concerns and to just notify you that he called back.    Thank you

## 2022-04-24 NOTE — Telephone Encounter (Signed)
yes

## 2022-04-24 NOTE — Telephone Encounter (Signed)
Called and spoke with patient concerning insurance. Surgery scheduler Darcus Austin CMA) will try to contact insurance again tomorrow and will call patient with follow up.

## 2022-04-27 NOTE — Telephone Encounter (Signed)
I was unable to reach claims with his insurance after a 45 minute phone call.  I did speak to patient and he is aware that we will try to reach out to our claims department to see what else can be done as well as reaching out to our manager.  Patient states he will try to continue to reach out to his insurance to see if he can gather any other information on how to resolve the issue.

## 2022-05-05 NOTE — Telephone Encounter (Signed)
My chart message sent informing patient we have been in touch with our claims department to review insurance denial.

## 2022-05-11 DIAGNOSIS — E559 Vitamin D deficiency, unspecified: Secondary | ICD-10-CM | POA: Diagnosis not present

## 2022-05-11 DIAGNOSIS — E785 Hyperlipidemia, unspecified: Secondary | ICD-10-CM | POA: Diagnosis not present

## 2022-05-11 DIAGNOSIS — I10 Essential (primary) hypertension: Secondary | ICD-10-CM | POA: Diagnosis not present

## 2022-05-11 DIAGNOSIS — K219 Gastro-esophageal reflux disease without esophagitis: Secondary | ICD-10-CM | POA: Diagnosis not present

## 2022-05-17 ENCOUNTER — Telehealth: Payer: Self-pay

## 2022-05-17 NOTE — Telephone Encounter (Signed)
Patient called advising he needed to know on  August 19, 2021 if his prostate was infact measured. He advised he is needing the measurement to provide to the insurance company.

## 2022-05-17 NOTE — Telephone Encounter (Signed)
Mychart message sent to patient.

## 2022-05-19 ENCOUNTER — Telehealth: Payer: Self-pay

## 2022-05-19 NOTE — Telephone Encounter (Signed)
Patient called advising last August he had a procedure and his insurance is not wanting to pay for the procedure. He also advised that his insurance is requesting the measurement of his prostate. He advised that on 08/19/2021 the cysto and he advised his prostate was measured at this appointment. He requested to speak with Dr. Ronne Binning directly. I advised patient that you are in clinic and unable to speak with him at this time. He advised he could be reached at [phone].  Thank you

## 2022-05-22 ENCOUNTER — Encounter: Payer: Self-pay | Admitting: Neurology

## 2022-05-22 ENCOUNTER — Ambulatory Visit (INDEPENDENT_AMBULATORY_CARE_PROVIDER_SITE_OTHER): Payer: BC Managed Care – PPO | Admitting: Neurology

## 2022-05-22 VITALS — BP 120/68 | HR 62 | Ht 70.5 in | Wt 242.0 lb

## 2022-05-22 DIAGNOSIS — E66811 Obesity, class 1: Secondary | ICD-10-CM

## 2022-05-22 DIAGNOSIS — R0683 Snoring: Secondary | ICD-10-CM | POA: Diagnosis not present

## 2022-05-22 DIAGNOSIS — G478 Other sleep disorders: Secondary | ICD-10-CM | POA: Insufficient documentation

## 2022-05-22 DIAGNOSIS — E6609 Other obesity due to excess calories: Secondary | ICD-10-CM | POA: Diagnosis not present

## 2022-05-22 DIAGNOSIS — Z6834 Body mass index (BMI) 34.0-34.9, adult: Secondary | ICD-10-CM

## 2022-05-22 DIAGNOSIS — R9439 Abnormal result of other cardiovascular function study: Secondary | ICD-10-CM

## 2022-05-22 DIAGNOSIS — N401 Enlarged prostate with lower urinary tract symptoms: Secondary | ICD-10-CM

## 2022-05-22 DIAGNOSIS — N138 Other obstructive and reflux uropathy: Secondary | ICD-10-CM

## 2022-05-22 NOTE — Progress Notes (Signed)
SLEEP MEDICINE CLINIC    Provider:  Melvyn Novas, MD  Primary Care Physician:  Carin Hock, PA [address] [place] [address]     Referring Provider: Clotilde Dieter, Do [address] [place],  [place] [address]          Chief Complaint according to patient   Patient presents with:     New Patient (Initial Visit)     Pt alone, rm 1. He is here to rule out concern for OSA. He avg 7 hrs a night may wake up 1x to void. When wakes up, he is not feeling refreshed. He has never had a SS. Wife witnessed snoring but no apnea. In dec 2023 had a stress test completed and had a heart cath. They have completed cardiac work up. CMA      HISTORY OF PRESENT ILLNESS:  Mitchell Bell is a 62 y.o. male patient who is seen upon referral on 05/22/2022 from Dr Melton Alar, cardiology for a sleep consult .  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 "    I have the pleasure of seeing Mitchell Bell 05/22/22 a right-handed male with a possible sleep disorder.    Sleep relevant medical history: Nocturia, urge to urinate- 2 times, BPH, No Tonsillectomy, but epistaxis, snoring but no apnea witnessed.   Family medical /sleep history: No other biological  family member on CPAP with OSA,  mother is alive at 33, father passed of MI at age 63.   Social history:  Patient is working as a Insurance claims handler, he makes valves for gas tanks, programming, daytime 6 AM-4 PM. Family status is married , with 2 grown children,no pets.   Tobacco use: none .  ETOH use ; socially-  2-3 drinks / week ,  Caffeine intake in form of Coffee( decaffeinated ) Soda( one coke in AM 12 ounces. ) Tea ( /) or energy drinks     Sleep habits are as follows: The patient's dinner time is between 6-6.30  PM. The patient goes to bed at 9-9.30 PM and continues to sleep for 7-7.5  hours, wakes for 1-2 bathroom  breaks, the first time at midnight, 1 AM.   The preferred sleep position is left sided , he snores when on his back, bedroom is cool, quiet and dark- with the support of 1 pillow, flat bed.  Dreams are reportedly infrequent.   The patient wakes up spontaneously just before his alarm 4. 10-AM - 4.25  AM is the usual rise time. He reports not feeling refreshed or restored in AM, with symptoms such as dry mouth, allergies affect him-  Naps are taken  very infrequently, last time after he had a COVID shot-    Cardiology: Mitchell Bell  is a 61 y.o. male with past medical history significant for hypertension and hyperlipidemia who is here for a follow-up visit. He has been doing well since our last visit. No new concerns or complaints. He denies palpitations, diaphoresis, syncope, claudication, edema, orthopnea, PND.       Past Medical History:  Diagnosis Date   ADHD (attention deficit hyperactivity disorder)     Anxiety     Arthritis     At risk for sleep apnea      STOP-BANG= 4      SENT TO PCP 09-10-2014  GERD (gastroesophageal reflux disease)     History of chicken pox     History of kidney stones     History of mumps as a child     Hypertension     Right knee pain      s/p  ORIF right patella fx 09-10-2014 w/ retained hardware   Schatzki's ring       per pt    Scoliosis      BACK PAIN   Review of Systems: Out of a complete 14 system review, the patient complains of only the following symptoms, and all other reviewed systems are negative.:  Fatigue, sleepiness , snoring, fragmented sleep, Insomnia, RLS, Nocturia 1-2   How likely are you to doze in the following situations: 0 = not likely, 1 = slight chance, 2 = moderate chance, 3 = high chance   Sitting and Reading? Watching Television? Sitting inactive in a public place (theater or meeting)? As a passenger in a car for an hour without a break? Lying down in the afternoon when circumstances permit? Sitting and talking to  someone? Sitting quietly after lunch without alcohol? In a car, while stopped for a few minutes in traffic?   Total = 02/ 24 points   FSS endorsed at 11/ 63 points.   Social History   Socioeconomic History   Marital status: Married    Spouse name: Not on file   Number of children: Not on file   Years of education: Not on file   Highest education level: Not on file  Occupational History   Not on file  Tobacco Use   Smoking status: Never   Smokeless tobacco: Never  Vaping Use   Vaping Use: Never used  Substance and Sexual Activity   Alcohol use: Yes    Alcohol/week: 1.0 standard drink of alcohol    Types: 1 Shots of liquor per week    Comment: 1 DRINK A WEEK/social   Drug use: No   Sexual activity: Yes  Other Topics Concern   Not on file  Social History Narrative   Not on file   Social Determinants of Health   Financial Resource Strain: Not on file  Food Insecurity: Not on file  Transportation Needs: Not on file  Physical Activity: Not on file  Stress: Not on file  Social Connections: Not on file    Family History  Problem Relation Age of Onset   Heart attack Father     Past Medical History:  Diagnosis Date   ADHD (attention deficit hyperactivity disorder)    Anxiety    Arthritis    At risk for sleep apnea    STOP-BANG= 4      SENT TO PCP 09-10-2014   GERD (gastroesophageal reflux disease)    History of chicken pox    History of kidney stones    History of mumps as a child    Hypertension    Right knee pain    s/p  ORIF right patella fx 09-10-2014 w/ retained hardware   Schatzki's ring     per pt    Scoliosis    BACK PAIN    Past Surgical History:  Procedure Laterality Date   CORONARY ANGIOGRAPHY N/A 01/06/2022   Procedure: CORONARY ANGIOGRAPHY;  Surgeon: Yates Decamp, MD;  [facility];  Service: Cardiovascular;  Laterality: N/A;   CYSTOSCOPY WITH INSERTION OF UROLIFT N/A 09/08/2021   Procedure: CYSTOSCOPY WITH INSERTION OF  UROLIFT;  Surgeon: Malen Gauze, MD;  Location: [facility];  Service: Urology;  Laterality: N/A;   HARDWARE REMOVAL Right 04/07/2015   Procedure: HARDWARE REMOVAL AND BONE FRAGMENTS,RIGHT PATELLA;  Surgeon: Ollen Gross, MD;  [facility] ;  Service: Orthopedics;  Laterality: Right;   INGUINAL HERNIA REPAIR Left age 39   KNEE ARTHROSCOPY Bilateral 2003   ORIF LEFT ANKLE FX  1990   retained hardware   ORIF PATELLA Right 09/10/2014   Procedure: OPEN REDUCTION INTERNAL (ORIF) RIGHT PERI  PATELLA FRACTURE;  Surgeon: Ollen Gross, MD;  Location: [facility];  Service: Orthopedics;  Laterality: Right;   TOTAL KNEE ARTHROPLASTY Bilateral 09/17/2013   Procedure: TOTAL KNEE BILATERAL;  Surgeon: Loanne Drilling, MD;  Location: [facility];  Service: Orthopedics;  Laterality: Bilateral;     Current Outpatient Medications on File Prior to Visit  Medication Sig Dispense Refill   Cholecalciferol (VITAMIN D) 50 MCG (2000 UT) tablet Take 4,000 Units by mouth in the morning and at bedtime.     hydrochlorothiazide (MICROZIDE) 12.5 MG capsule Take 12.5 mg by mouth daily.     losartan (COZAAR) 25 MG tablet Take 1 tablet (25 mg total) by mouth at bedtime. 90 tablet 3   MegaRed Omega-3 Krill Oil 350 MG CAPS Take 350 mg by mouth daily.     metoprolol succinate (TOPROL XL) 25 MG 24 hr tablet Take 1 tablet (25 mg total) by mouth in the morning and at bedtime. 60 tablet 6   omeprazole (PRILOSEC) 20 MG capsule Take 20 mg by mouth every morning.     rosuvastatin (CRESTOR) 20 MG tablet Take 1 tablet (20 mg total) by mouth at bedtime. 90 tablet 3   No current facility-administered medications on file prior to visit.    Allergies  Allergen Reactions   Amlodipine Besylate Swelling    Ankle swelling      DIAGNOSTIC DATA (LABS, IMAGING, TESTING) - I reviewed patient records, labs, notes, testing and imaging myself where available.  Left Heart Catheterization 01/06/22:  RCA: It is a large vessel,  dominant.  Gives origin to large PDA and  moderate-sized PL branch.  Slow flow is evident in the RCA suggestive of  microvascular disease.  LM: Short, smooth and normal.  LCx: Very smooth and normal, very tiny OM1 large OM 2 and OM 3.  AV groove  circumflex is small.  LAD: Smooth and normal.  Gives origin to small diagonal 1 and a very large  diagonal 2.  Septal perforator 2 is also large.   Lab Results  Component Value Date   WBC 5.1 01/06/2022   HGB 14.3 01/06/2022   HCT 40.2 01/06/2022   MCV 89.5 01/06/2022   PLT 162 01/06/2022      Component Value Date/Time   NA 140 01/06/2022 0755   NA 138 01/05/2022 1114   K 3.6 01/06/2022 0755   CL 107 01/06/2022 0755   CO2 25 01/06/2022 0755   GLUCOSE 106 (H) 01/06/2022 0755   BUN 9 01/06/2022 0755   BUN 11 01/05/2022 1114   CREATININE 0.71 01/06/2022 0755   CALCIUM 9.0 01/06/2022 0755   PROT 6.7 09/05/2013 0850   ALBUMIN 3.7 09/05/2013 0850   AST 16 09/05/2013 0850   ALT 23 09/05/2013 0850   ALKPHOS 71 09/05/2013 0850   BILITOT 0.9 09/05/2013 0850   GFRNONAA >60 01/06/2022 0755   GFRAA >60 09/09/2014 1045   No results found for: "CHOL", "HDL", "LDLCALC", "LDLDIRECT", "TRIG", "CHOLHDL" No results found for: "HGBA1C" No results found for: "VITAMINB12"  No results found for: "TSH"  PHYSICAL EXAM:  Today's Vitals   05/22/22 1517  BP: 120/68  Pulse: 62  Weight: 242 lb (109.8 kg)  Height: 5' 10.5" (1.791 m)   Body mass index is 34.23 kg/m.   Wt Readings from Last 3 Encounters:  05/22/22 242 lb (109.8 kg)  04/10/22 239 lb (108.4 kg)  02/17/22 239 lb 6.4 oz (108.6 kg)     Ht Readings from Last 3 Encounters:  05/22/22 5' 10.5" (1.791 m)  04/10/22 5' 10.5" (1.791 m)  02/17/22 5' 10.5" (1.791 m)      General: The patient is awake, alert and appears not in acute distress. The patient is well groomed. Head: Normocephalic, atraumatic. Neck is supple.  Mallampati 2-3,  neck circumference:18.5 inches .  Nasal airflow  patent.   Retrognathia is not seen. Facial hair.  Dental status: biological  Cardiovascular:  Regular rate and cardiac rhythm by pulse,  without distended neck veins. Respiratory: Lungs are clear to auscultation.  Skin:  Without evidence of ankle edema, or rash. Trunk: The patient's posture is erect.   NEUROLOGIC EXAM: The patient is awake and alert, oriented to place and time.   Memory subjective described as intact.  Attention span & concentration ability appears normal.  Speech is fluent,  without  dysarthria, dysphonia or aphasia.  Mood and affect are appropriate.   Cranial nerves: no loss of smell or taste reported  Pupils are equal and briskly reactive to light. Funduscopic exam deferred. .  Extraocular movements in vertical and horizontal planes were intact and without nystagmus. No Diplopia. Visual fields by finger perimetry are intact. Hearing was intact to soft voice.    Facial sensation intact to fine touch.  Facial motor strength is symmetric and tongue and uvula move midline.  Neck ROM : rotation, tilt and flexion extension were normal for age and shoulder shrug was symmetrical.    Motor exam:  Symmetric bulk, tone and ROM.   Normal tone without cog- wheeling, symmetric grip strength .   Sensory:  def.   Coordination: without evidence of ataxia, dysmetria or tremor.   Gait and station: Patient could rise unassisted from a seated position, walked without assistive device.  Stance is of normal width/ base and the patient turned with 3 steps.  Toe and heel walk were deferred.  Deep tendon reflexes: in the  upper and lower extremities are symmetric and intact.  Status post TK replacement in 4 age 59.  Babinski response was deferred.    ASSESSMENT AND PLAN 62 y.o. year old male  here with:    1) The patient is sure he snores, he wakes up from it once in a while, and usually when he slept on his back. Apnea has not ben witnessed. . But some risk factors are there,  larger neck size and BMI near 35.  He has a muscular built, he likes to be active.  2) non- restorative sleep, but no hypersomnia.   3) He gets 6.5 hours of sleep -   I will order a HST for screening for sleep apnea.    I plan to follow up either personally or through our NP within 3-6 months.   I would like to thank Carin Hock, PA and [place], [place], Do [address] [place],  [place] [address] for allowing me to meet with and to take care of this pleasant patient.   CC: I will share my notes with PCP and Cardiologist .  After spending a total time of  35  minutes face to face and additional time for physical and neurologic examination, review of laboratory studies,  personal review of imaging studies, reports and results of other testing and review of referral information / records as far as provided in visit,   Electronically signed by: Melvyn Novas, MD 05/22/2022 3:50 PM  [facility] Board certified by The [employer] of Sleep Medicine and Diplomate of the [employer] of Sleep Medicine. Board certified In Neurology through the ABPN, Fellow of the [employer] of Neurology. Medical Director of [facility].

## 2022-06-05 DIAGNOSIS — D2271 Melanocytic nevi of right lower limb, including hip: Secondary | ICD-10-CM | POA: Diagnosis not present

## 2022-06-05 DIAGNOSIS — D2262 Melanocytic nevi of left upper limb, including shoulder: Secondary | ICD-10-CM | POA: Diagnosis not present

## 2022-06-05 DIAGNOSIS — D2261 Melanocytic nevi of right upper limb, including shoulder: Secondary | ICD-10-CM | POA: Diagnosis not present

## 2022-06-05 DIAGNOSIS — D225 Melanocytic nevi of trunk: Secondary | ICD-10-CM | POA: Diagnosis not present

## 2022-06-05 DIAGNOSIS — L820 Inflamed seborrheic keratosis: Secondary | ICD-10-CM | POA: Diagnosis not present

## 2022-06-05 DIAGNOSIS — D485 Neoplasm of uncertain behavior of skin: Secondary | ICD-10-CM | POA: Diagnosis not present

## 2022-06-05 DIAGNOSIS — L570 Actinic keratosis: Secondary | ICD-10-CM | POA: Diagnosis not present

## 2022-06-06 NOTE — Telephone Encounter (Signed)
Please see patient request to speak with you.

## 2022-06-12 ENCOUNTER — Encounter: Payer: Self-pay | Admitting: Neurology

## 2022-06-21 ENCOUNTER — Encounter: Payer: Self-pay | Admitting: Urology

## 2022-06-27 ENCOUNTER — Ambulatory Visit: Payer: BC Managed Care – PPO | Admitting: Neurology

## 2022-06-27 DIAGNOSIS — G4733 Obstructive sleep apnea (adult) (pediatric): Secondary | ICD-10-CM

## 2022-06-27 DIAGNOSIS — I209 Angina pectoris, unspecified: Secondary | ICD-10-CM

## 2022-06-27 DIAGNOSIS — R0683 Snoring: Secondary | ICD-10-CM

## 2022-06-27 DIAGNOSIS — E6609 Other obesity due to excess calories: Secondary | ICD-10-CM

## 2022-06-27 DIAGNOSIS — G478 Other sleep disorders: Secondary | ICD-10-CM

## 2022-07-03 NOTE — Progress Notes (Signed)
[facility]   HOME SLEEP TEST REPORT ( MAIL out- by Watch PAT)   STUDY DATE:  07-03-2022 Mitchell Penna "Donnie" 62 year old male 13-Aug-1960 ORDERING CLINICIAN: Melvyn Novas, MD  REFERRING CLINICIAN: Dr. Melton Alar , DO cardiology   CLINICAL INFORMATION/HISTORY: [name] is a 62 y.o. male patient who is seen upon referral on 05/22/2022  - referral from Dr Melton Alar, cardiology for a sleep consult .  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 The patient reports  having undergone a coronary angiography under dr Jacinto Halim in Dec 2023. Nocturia " Hx of GERD, high Stop Bang  score of 4 in 2016, ADHD, Scoliosis.    Epworth sleepiness score: 2 /24.  FSS at 11/ 63 points.    BMI: 34 kg/m   Neck Circumference: 18.5"   FINDINGS:   Sleep Summary:   Total Recording Time (hours, min):   8 hours 10 minutes  Total Sleep Time (hours, min):   7 hours 40 minutes              Percent REM (%): 32.5%      Sleep latency was 17 minutes, REM sleep latency 54 minutes long.  Sleep was not fragmented.                                  Respiratory Indices:   Calculated pAHI (per hour): 16.8/h                           REM pAHI: 26.2/h                                               NREM pAHI:   12/h                           Supine AHI: This patient slept 250 minutes in supine position associated with an AHI of 23.4/h and an RDI of 32.2/h.  204 minutes of sleep were recorded in right lateral position associated with an AHI of only 6.2/h.  Snoring statistics show a mean volume of snoring at only 40 dB and only for less than 9% of total sleep time.                                                  Oxygen Saturation Statistics:         O2 Saturation Range (%):     Between a nadir at 81% and a maximum saturation of 99% with a mean saturation of 92%.                                   O2 Saturation (minutes) <89%:    0.7 minutes.       Pulse Rate Statistics:   Pulse Mean (bpm):    64 bpm  Pulse Range: Between 44 and 93 bpm               IMPRESSION:  This HST confirms the presence of moderate severe obstructive sleep apnea which was exacerbated and clearly dependent on REM sleep.  In addition there is a positional dependency as well.  The REM sleep component can only be addressed with positive airway pressure, supine component can be addressed with behavioral changes and with her request to avoid sleeping on the back.  I was surprised how little snoring was recorded.   RECOMMENDATION:  Auto- titrating CPAP with a setting between 5 and 16 cm water pressure, 2 cm EPR, heated humidifier and mask of patient's choice.  This patient has facial hair, please fit accordingly.  The patient should be advised to contact the medical equipment company with all questions of machine function and mask fitting.  Every visit in the sleep clinic will take place between the 30th and 90th day of CPAP use.  Please confirm that the patient will bring his machine and all attachments to the first visit in the sleep clinic after starting on his CPAP machine.  Compliance is defined as 4 hours or more of daily use.    INTERPRETING PHYSICIAN:   Melvyn Novas, MD  [facility].

## 2022-07-14 ENCOUNTER — Ambulatory Visit (INDEPENDENT_AMBULATORY_CARE_PROVIDER_SITE_OTHER): Payer: BC Managed Care – PPO | Admitting: Urology

## 2022-07-14 VITALS — BP 129/84 | HR 67 | Ht 70.5 in | Wt 234.0 lb

## 2022-07-14 DIAGNOSIS — N138 Other obstructive and reflux uropathy: Secondary | ICD-10-CM

## 2022-07-14 DIAGNOSIS — R310 Gross hematuria: Secondary | ICD-10-CM

## 2022-07-14 DIAGNOSIS — R3912 Poor urinary stream: Secondary | ICD-10-CM | POA: Diagnosis not present

## 2022-07-14 DIAGNOSIS — N401 Enlarged prostate with lower urinary tract symptoms: Secondary | ICD-10-CM | POA: Diagnosis not present

## 2022-07-14 LAB — MICROSCOPIC EXAMINATION: Bacteria, UA: NONE SEEN

## 2022-07-14 LAB — URINALYSIS, ROUTINE W REFLEX MICROSCOPIC
Bilirubin, UA: NEGATIVE
Glucose, UA: NEGATIVE
Ketones, UA: NEGATIVE
Leukocytes,UA: NEGATIVE
Nitrite, UA: NEGATIVE
Protein,UA: NEGATIVE
Specific Gravity, UA: 1.02 (ref 1.005–1.030)
Urobilinogen, Ur: 1 mg/dL (ref 0.2–1.0)
pH, UA: 6.5 (ref 5.0–7.5)

## 2022-07-14 MED ORDER — SILODOSIN 8 MG PO CAPS: 8.0000 mg | ORAL_CAPSULE | Freq: Every day | ORAL | 11 refills | Status: DC

## 2022-07-14 NOTE — Progress Notes (Unsigned)
07/14/2022 9:05 AM   Mitchell Bell [birth year] [id number]  Referring provider: Carin Hock, PA [address] [place],  [place] [address]  Followup BPH   HPI: Mitchell Bell is a 61yo here for followup for BPH. IPSS 11 QOL 3 on no BPH therapy. He notes his stream has weakened significantly since last visit. He has urinary hesitancy which bothers him. No gross hematuria since last visit. He denies straining to urinate  PMH: Past Medical History:  Diagnosis Date   ADHD (attention deficit hyperactivity disorder)    Anxiety    Arthritis    At risk for sleep apnea    STOP-BANG= 4      SENT TO PCP 09-10-2014   GERD (gastroesophageal reflux disease)    History of chicken pox    History of kidney stones    History of mumps as a child    Hypertension    Right knee pain    s/p  ORIF right patella fx 09-10-2014 w/ retained hardware   Schatzki's ring     per pt    Scoliosis    BACK PAIN    Surgical History: Past Surgical History:  Procedure Laterality Date   CORONARY ANGIOGRAPHY N/A 01/06/2022   Procedure: CORONARY ANGIOGRAPHY;  Surgeon: Yates Decamp, MD;  [facility];  Service: Cardiovascular;  Laterality: N/A;   CYSTOSCOPY WITH INSERTION OF UROLIFT N/A 09/08/2021   Procedure: CYSTOSCOPY WITH INSERTION OF UROLIFT;  Surgeon: Malen Gauze, MD;  Location: [facility];  Service: Urology;  Laterality: N/A;   HARDWARE REMOVAL Right 04/07/2015   Procedure: HARDWARE REMOVAL AND BONE FRAGMENTS,RIGHT PATELLA;  Surgeon: Ollen Gross, MD;  Location: [facility];  Service: Orthopedics;  Laterality: Right;   INGUINAL HERNIA REPAIR Left age 64   KNEE ARTHROSCOPY Bilateral 2003   ORIF LEFT ANKLE FX  1990   retained hardware   ORIF PATELLA Right 09/10/2014   Procedure: OPEN REDUCTION INTERNAL (ORIF) RIGHT PERI  PATELLA FRACTURE;  Surgeon: Ollen Gross, MD;  Location: [facility];  Service: Orthopedics;  Laterality: Right;   TOTAL KNEE ARTHROPLASTY Bilateral  09/17/2013   Procedure: TOTAL KNEE BILATERAL;  Surgeon: Loanne Drilling, MD;  Location: [facility];  Service: Orthopedics;  Laterality: Bilateral;    Home Medications:  Allergies as of 07/14/2022       Reactions   Amlodipine Besylate Swelling   Ankle swelling         Medication List        Accurate as of July 14, 2022  9:05 AM. If you have any questions, ask your nurse or doctor.          hydrochlorothiazide 12.5 MG capsule Commonly known as: MICROZIDE Take 12.5 mg by mouth daily.   losartan 25 MG tablet Commonly known as: COZAAR Take 1 tablet (25 mg total) by mouth at bedtime.   MegaRed Omega-3 Krill Oil 350 MG Caps Take 350 mg by mouth daily.   metoprolol succinate 25 MG 24 hr tablet Commonly known as: Toprol XL Take 1 tablet (25 mg total) by mouth in the morning and at bedtime.   omeprazole 20 MG capsule Commonly known as: PRILOSEC Take 20 mg by mouth every morning.   rosuvastatin 20 MG tablet Commonly known as: CRESTOR Take 1 tablet (20 mg total) by mouth at bedtime.   Vitamin D 50 MCG (2000 UT) tablet Take 4,000 Units by mouth in the morning and at bedtime.        Allergies:  Allergies  Allergen Reactions   Amlodipine Besylate Swelling    Ankle swelling     Family History: Family History  Problem Relation Age of Onset   Heart attack Father     Social History:  reports that he has never smoked. He has never used smokeless tobacco. He reports current alcohol use of about 1.0 standard drink of alcohol per week. He reports that he does not use drugs.  ROS: All other review of systems were reviewed and are negative except what is noted above in HPI  Physical Exam: BP 129/84   Pulse 67   Ht 5' 10.5" (1.791 m)   Wt 234 lb (106.1 kg)   BMI 33.10 kg/m   Constitutional:  Alert and oriented, No acute distress. HEENT: [place] AT, moist mucus membranes.  Trachea midline, no masses. Cardiovascular: No clubbing, cyanosis, or edema. Respiratory: Normal  respiratory effort, no increased work of breathing. GI: Abdomen is soft, nontender, nondistended, no abdominal masses GU: No CVA tenderness.  Lymph: No cervical or inguinal lymphadenopathy. Skin: No rashes, bruises or suspicious lesions. Neurologic: Grossly intact, no focal deficits, moving all 4 extremities. Psychiatric: Normal mood and affect.  Laboratory Data: Lab Results  Component Value Date   WBC 5.1 01/06/2022   HGB 14.3 01/06/2022   HCT 40.2 01/06/2022   MCV 89.5 01/06/2022   PLT 162 01/06/2022    Lab Results  Component Value Date   CREATININE 0.71 01/06/2022    No results found for: "PSA"  No results found for: "TESTOSTERONE"  No results found for: "HGBA1C"  Urinalysis    Component Value Date/Time   COLORURINE YELLOW 09/10/2021 1145   APPEARANCEUR Clear 01/13/2022 0940   LABSPEC 1.012 09/10/2021 1145   PHURINE 6.0 09/10/2021 1145   GLUCOSEU Negative 01/13/2022 0940   HGBUR LARGE (A) 09/10/2021 1145   BILIRUBINUR Negative 01/13/2022 0940   KETONESUR NEGATIVE 09/10/2021 1145   PROTEINUR Negative 01/13/2022 0940   PROTEINUR 30 (A) 09/10/2021 1145   UROBILINOGEN 1.0 09/05/2013 0805   NITRITE Negative 01/13/2022 0940   NITRITE NEGATIVE 09/10/2021 1145   LEUKOCYTESUR Negative 01/13/2022 0940   LEUKOCYTESUR TRACE (A) 09/10/2021 1145    Lab Results  Component Value Date   LABMICR See below: 01/13/2022   WBCUA None seen 01/13/2022   LABEPIT None seen 01/13/2022   MUCUS Present 08/19/2021   BACTERIA None seen 01/13/2022    Pertinent Imaging:  No results found for this or any previous visit.  No results found for this or any previous visit.  No results found for this or any previous visit.  No results found for this or any previous visit.  No results found for this or any previous visit.  No valid procedures specified. No results found for this or any previous visit.  No results found for this or any previous visit.   Assessment & Plan:     1. Benign prostatic hyperplasia with urinary obstruction -restart rapaflo 8mg  - Urinalysis, Routine w reflex microscopic  2. Weak urinary stream -restart rapaflo 8mg . If his stream fails to improve we will proceed with cystoscopy  3. Gross hematuria resolved   No follow-ups on file.  Wilkie Aye, MD  [facility] [place]

## 2022-07-16 NOTE — Procedures (Signed)
          [facility]   HOME SLEEP TEST REPORT ( MAIL out- by Watch PAT)   STUDY DATE:  07-03-2022 Mitchell Bell "Donnie" 62 year old male 01/11/1961 ORDERING CLINICIAN: Alanea Woolridge, MD  REFERRING CLINICIAN: Dr. Custovic , DO cardiology   CLINICAL INFORMATION/HISTORY: [name] is a 62 y.o. male patient who is seen upon referral on 05/22/2022  - referral from Dr Custovic, cardiology for a sleep consult .  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 The patient reports  having undergone a coronary angiography under dr Ganji in Dec 2023. Nocturia " Hx of GERD, high Stop Bang  score of 4 in 2016, ADHD, Scoliosis.    Epworth sleepiness score: 2 /24.  FSS at 11/ 63 points.    BMI: 34 kg/m   Neck Circumference: 18.5"   FINDINGS:   Sleep Summary:   Total Recording Time (hours, min):   8 hours 10 minutes  Total Sleep Time (hours, min):   7 hours 40 minutes              Percent REM (%): 32.5%      Sleep latency was 17 minutes, REM sleep latency 54 minutes long.  Sleep was not fragmented.                                  Respiratory Indices:   Calculated pAHI (per hour): 16.8/h                           REM pAHI: 26.2/h                                               NREM pAHI:   12/h                           Supine AHI: This patient slept 250 minutes in supine position associated with an AHI of 23.4/h and an RDI of 32.2/h.  204 minutes of sleep were recorded in right lateral position associated with an AHI of only 6.2/h.  Snoring statistics show a mean volume of snoring at only 40 dB and only for less than 9% of total sleep time.                                                  Oxygen Saturation Statistics:         O2 Saturation Range (%):     Between a nadir at 81% and a maximum saturation of 99% with a mean saturation of 92%.                                   O2 Saturation (minutes) <89%:    0.7 minutes.       Pulse Rate Statistics:   Pulse Mean (bpm):    64 bpm               Pulse Range: Between 44 and 93 bpm               IMPRESSION:  This HST confirms the presence of moderate severe obstructive sleep apnea which was exacerbated and clearly dependent on REM sleep.  In addition there is a positional dependency as well.  The REM sleep component can only be addressed with positive airway pressure, supine component can be addressed with behavioral changes and with her request to avoid sleeping on the back.  I was surprised how little snoring was recorded.   RECOMMENDATION:  Auto- titrating CPAP with a setting between 5 and 16 cm water pressure, 2 cm EPR, heated humidifier and mask of patient's choice.  This patient has facial hair, please fit accordingly.  The patient should be advised to contact the medical equipment company with all questions of machine function and mask fitting.  Every visit in the sleep clinic will take place between the 30th and 90th day of CPAP use.  Please confirm that the patient will bring his machine and all attachments to the first visit in the sleep clinic after starting on his CPAP machine.  Compliance is defined as 4 hours or more of daily use.    INTERPRETING PHYSICIAN:   Jerell Demery, MD  [facility].                    

## 2022-07-19 ENCOUNTER — Telehealth: Payer: Self-pay | Admitting: Neurology

## 2022-07-19 NOTE — Telephone Encounter (Signed)
Called patient to discuss sleep study results. No answer at this time. LVM for the patient to call back.  I will also send a mychart message.   "Auto- titrating CPAP with a setting between 5 and 16 cm water pressure, 2 cm EPR, heated humidifier and mask of patient's choice.  This patient has facial hair, please fit accordingly.  The patient should be advised to contact the medical equipment company with all questions of machine function and mask fitting.  Every visit in the sleep clinic will take place between the 30th and 90th day of CPAP use.  Please confirm that the patient will bring his machine and all attachments to the first visit in the sleep clinic after starting on his CPAP machine.  Compliance is defined as 4 hours or more of daily use."

## 2022-07-20 ENCOUNTER — Encounter: Payer: Self-pay | Admitting: Urology

## 2022-07-20 NOTE — Patient Instructions (Signed)

## 2022-08-04 ENCOUNTER — Ambulatory Visit: Payer: BC Managed Care – PPO | Admitting: Internal Medicine

## 2022-08-04 ENCOUNTER — Ambulatory Visit: Payer: BC Managed Care – PPO | Admitting: Cardiology

## 2022-08-04 DIAGNOSIS — I2089 Other forms of angina pectoris: Secondary | ICD-10-CM | POA: Diagnosis not present

## 2022-08-04 DIAGNOSIS — G4733 Obstructive sleep apnea (adult) (pediatric): Secondary | ICD-10-CM | POA: Diagnosis not present

## 2022-08-04 DIAGNOSIS — E782 Mixed hyperlipidemia: Secondary | ICD-10-CM | POA: Diagnosis not present

## 2022-08-04 DIAGNOSIS — I2585 Chronic coronary microvascular dysfunction: Secondary | ICD-10-CM | POA: Diagnosis not present

## 2022-08-23 ENCOUNTER — Other Ambulatory Visit: Payer: BC Managed Care – PPO | Admitting: Urology

## 2022-08-25 ENCOUNTER — Encounter: Payer: Self-pay | Admitting: Dietician

## 2022-08-25 ENCOUNTER — Encounter: Payer: BC Managed Care – PPO | Attending: Physician Assistant | Admitting: Dietician

## 2022-08-25 VITALS — Ht 70.5 in | Wt 237.8 lb

## 2022-08-25 DIAGNOSIS — Z6834 Body mass index (BMI) 34.0-34.9, adult: Secondary | ICD-10-CM

## 2022-08-25 DIAGNOSIS — E6609 Other obesity due to excess calories: Secondary | ICD-10-CM | POA: Diagnosis not present

## 2022-08-25 NOTE — Progress Notes (Signed)
Medical Nutrition Therapy  Appointment Start time:  [phone]  Appointment End time:  0940  Primary concerns today: Weight loss  Referral diagnosis: E66.9 - Obesity Preferred learning style: No preference indicated Learning readiness: Ready   NUTRITION ASSESSMENT   Anthropometrics  Ht: 5'10.5" Wt: 237.8 lbs BMI: 33.64 kg/m2  UBW: 232 - 236 lbs. Goal Weight: 200 lbs  Clinical Medical Hx: HTN, HLD, GERD Medications: Losartan, Hydrochlorothiazide, Rosuvastatin, Omeprazole, Labs: N/A Notable Signs/Symptoms: N/A   Lifestyle & Dietary Hx Pt reports desire to lose weight (~30 lbs), states that they previously lost weight and their knees felt better and snored less.  Pt reports eating largest meal for lunch and snacking less in the evenings to achieve this weight loss. This occurred when working 2nd shift, pt now works 1st shift and dietary habits have changed, more snacking/drinking in the evenings. Pt reports weighing daily. Pt reports desire to minimize medication as much as possible. Pt reports failing a cardiac stress test in December, got a cardiac catheter placed in January. 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 Pt reports gluten sensitivity, larger amounts can upset stomach. Pt also reports GERD that is usually controlled with daily Omeprazole, but can still be problematic at times.   Estimated daily fluid intake: 64 oz Supplements: Krill Oil, Vit D Sleep: Done sleep study, showed OSA, recommended CPAP Stress / self-care: Low Current average weekly physical activity: ADLs, somewhat active at work   24-Hr Dietary Recall First Meal: Cottage cheese and peaches Snack: [employer] PB cookie, 12 oz Coke Second Meal: Spaghetti w/ meat sauce, 1 slice of bread, water, 2 dove dark chocolate bites Snack: Apple, water Third Meal: Gluten free macaroni w/ meat sauce, parmesan cheese, water Snack: 4 oz wine Beverages: Water,  coke   NUTRITION DIAGNOSIS  NB-1.1 Food and nutrition-related knowledge deficit As related to obesity.  As evidenced by BMI of 33.64 kg/m2, sedentary lifestyle, late day snacking on energy dense foods/beverages.   NUTRITION INTERVENTION  Nutrition education (E-1) on the following topics:  Educated patient on the two components of energy balance: Energy in (calories), and energy out (activity). Explain the role of negative energy balance in weight loss. Discussed options with patient to achieve a negative energy balance and how to best control energy in and energy out to accommodate their lifestyle. Educated patient on the calorie content of macronutrients (carbs, protein, fat, alcohol), and choosing nutrient dense foods more often than energy dense foods. Educated patient on mindful eating, slowing down meals, and listening to hunger/satiety cues.  Handouts Provided Include  MyPlate Kcal per macronutrient GERD Nutrition Therapy  Learning Style & Readiness for Change Teaching method utilized: Visual & Auditory  Demonstrated degree of understanding via: Teach Back  Barriers to learning/adherence to lifestyle change: None  Goals Established by Pt When having packaged foods, try to have no more than 1g of saturated fat (<7%) per serving. Use a combination of these three strategies to effectively lower your consumption of foods of concern (sweets, high fat foods, alcohol) 1) Consume a smaller portion when having these foods 2) Consume foods of concern less frequently 3) Find a reduced or low fat or calorie version Begin to recognize carbohydrates, proteins, and non-starchy vegetables in your food choices! Begin to build your meals using the proportions of the Balanced Plate. First, select your carb choice(s) for the meal. Make this 25% of your meal. Next, select your source of protein to pair with your carb choice(s). Make  this another 25% of your meal. Finally, complete your meal with a  variety of non-starchy vegetables. Make this the remaining 50% of your meal. Follow the guidelines of your GERD handout to reduce reflux in the evenings Weigh yourself once a week on the same day (Thursday morning), look for a loss of no more than 1-2 pounds per week.   MONITORING & EVALUATION Dietary intake, weekly physical activity, and weight change in 8 weeks.  Next Steps  Patient is to follow up with RD.

## 2022-08-25 NOTE — Patient Instructions (Addendum)
When having packaged foods, try to have no more than 1g of saturated fat (<7%) per serving.  Use a combination of these three strategies to effectively lower your consumption of foods of concern (sweets, high fat foods, alcohol) 1) Consume a smaller portion when having these foods 2) Consume foods of concern less frequently 3) Find a reduced or low fat or calorie version  Begin to recognize carbohydrates, proteins, and non-starchy vegetables in your food choices!  Begin to build your meals using the proportions of the Balanced Plate. First, select your carb choice(s) for the meal. Make this 25% of your meal. Next, select your source of protein to pair with your carb choice(s). Make this another 25% of your meal. Finally, complete your meal with a variety of non-starchy vegetables. Make this the remaining 50% of your meal.  Follow the guidelines of your GERD handout to reduce reflux in the evenings  Weigh yourself once a week on the same day (Thursday morning), look for a loss of no more than 1-2 pounds per week.

## 2022-09-11 ENCOUNTER — Telehealth: Payer: Self-pay

## 2022-09-11 NOTE — Telephone Encounter (Signed)
Patient left a voice message 09-11-2022.  Needing medication to take the "edge" off. He stated he spoke with Dr. Ronne Binning.  Please advise.

## 2022-09-12 NOTE — Telephone Encounter (Signed)
Patient is requesting a valium be sent in prior to his cysto with Dr. Ronne Binning tomorrow.

## 2022-09-13 ENCOUNTER — Other Ambulatory Visit: Payer: Self-pay | Admitting: Internal Medicine

## 2022-09-13 ENCOUNTER — Ambulatory Visit: Payer: BC Managed Care – PPO | Admitting: Urology

## 2022-09-13 ENCOUNTER — Telehealth: Payer: Self-pay

## 2022-09-13 ENCOUNTER — Other Ambulatory Visit: Payer: Self-pay | Admitting: Urology

## 2022-09-13 VITALS — BP 119/78 | HR 63

## 2022-09-13 DIAGNOSIS — N400 Enlarged prostate without lower urinary tract symptoms: Secondary | ICD-10-CM

## 2022-09-13 DIAGNOSIS — R3912 Poor urinary stream: Secondary | ICD-10-CM | POA: Diagnosis not present

## 2022-09-13 MED ORDER — DIAZEPAM 10 MG PO TABS: 10.0000 mg | ORAL_TABLET | Freq: Once | ORAL | 0 refills | Status: AC

## 2022-09-13 MED ORDER — DIAZEPAM 10 MG PO TABS: 10.0000 mg | ORAL_TABLET | Freq: Once | ORAL | 0 refills | Status: DC

## 2022-09-13 MED ORDER — CIPROFLOXACIN HCL 500 MG PO TABS
500.0000 mg | ORAL_TABLET | Freq: Once | ORAL | Status: AC
Start: 2022-09-13 — End: 2022-09-13
  Administered 2022-09-13: 500 mg via ORAL

## 2022-09-13 NOTE — Telephone Encounter (Signed)
Patient left a voice message 09-13-2022.  Wanting to speak with Marchelle Folks regarding his upcoming surgery. Insurance is requiring a pre authorization, wanting to know if Dr. Wilkie Aye is in network?  Please advise.  Call:  [phone]

## 2022-09-14 LAB — URINALYSIS, ROUTINE W REFLEX MICROSCOPIC
Bilirubin, UA: NEGATIVE
Glucose, UA: NEGATIVE
Ketones, UA: NEGATIVE
Leukocytes,UA: NEGATIVE
Nitrite, UA: NEGATIVE
Protein,UA: NEGATIVE
Specific Gravity, UA: 1.01 (ref 1.005–1.030)
Urobilinogen, Ur: 0.2 mg/dL (ref 0.2–1.0)
pH, UA: 7 (ref 5.0–7.5)

## 2022-09-14 LAB — MICROSCOPIC EXAMINATION
Bacteria, UA: NONE SEEN
Epithelial Cells (non renal): NONE SEEN /HPF (ref 0–10)
WBC, UA: NONE SEEN /HPF (ref 0–5)

## 2022-09-15 NOTE — Telephone Encounter (Signed)
Will be scheduled for surgery- please see patient request concerning insurance.

## 2022-09-19 NOTE — Patient Instructions (Signed)

## 2022-09-19 NOTE — Progress Notes (Signed)
   09/13/2022  CC: difficulty urinating   HPI: Mr Mitchell Bell is a 61yo here for cystoscopy for difficulty urinating Blood pressure 119/78, pulse 63. NED. A&Ox3.   No respiratory distress   Abd soft, NT, ND Normal phallus with bilateral descended testicles  Cystoscopy Procedure Note  Patient identification was confirmed, informed consent was obtained, and patient was prepped using Betadine solution.  Lidocaine jelly was administered per urethral meatus.     Pre-Procedure: - Inspection reveals a normal caliber ureteral meatus.  Procedure: The flexible cystoscope was introduced without difficulty - No urethral strictures/lesions are present. - Enlarged prostate bladder neck obstructed - Elevated bladder neck - Bilateral ureteral orifices identified - Bladder mucosa  reveals no ulcers, tumors, or lesions - No bladder stones - No trabeculation     Post-Procedure: - Patient tolerated the procedure well  Assessment/ Plan: We discussed the management of his BPH including continued medical therapy, Rezum, Urolift, TURP and simple prostatectomy. After discussing the options the patient the patient will calkl with his decision   No follow-ups on file.  Mitchell Aye, MD

## 2022-09-20 NOTE — Telephone Encounter (Signed)
Patient calling to speak with Guss Bunde, CMA.  Patient spoke with his insurance.  Call:  [phone]

## 2022-09-20 NOTE — Telephone Encounter (Signed)
Patient requested 10/24 surgery date.  Please add to surgery workque

## 2022-09-26 ENCOUNTER — Other Ambulatory Visit: Payer: Self-pay | Admitting: Urology

## 2022-09-26 DIAGNOSIS — N138 Other obstructive and reflux uropathy: Secondary | ICD-10-CM

## 2022-10-19 ENCOUNTER — Telehealth: Payer: Self-pay

## 2022-10-19 NOTE — Telephone Encounter (Signed)
Patient has concerns with his catheter getting stopped up post procedure.  He does not want to end up in the hospital over the weekend.  He is asking if someone can show him how to flush his own catheter prior to leaving the hospital.  Can you write orders to have a nurse go over instructions with him and his wife prior to discharge.  Surgery scheduled for 11/16/2022.

## 2022-10-27 ENCOUNTER — Encounter: Payer: BC Managed Care – PPO | Attending: Physician Assistant | Admitting: Dietician

## 2022-10-27 ENCOUNTER — Encounter: Payer: Self-pay | Admitting: Dietician

## 2022-10-27 VITALS — Ht 70.5 in | Wt 233.1 lb

## 2022-10-27 DIAGNOSIS — E6609 Other obesity due to excess calories: Secondary | ICD-10-CM | POA: Insufficient documentation

## 2022-10-27 DIAGNOSIS — Z6834 Body mass index (BMI) 34.0-34.9, adult: Secondary | ICD-10-CM | POA: Insufficient documentation

## 2022-10-27 DIAGNOSIS — E66811 Obesity, class 1: Secondary | ICD-10-CM | POA: Diagnosis not present

## 2022-10-27 NOTE — Patient Instructions (Addendum)
When snacking in the evenings, have Cheez-its or animal crackers 1 at a time and be sure to chew and swallow each piece before having the next. Have a sip of water in between bites!  Try to walk twice each week with some intensity to increase your rate of weight loss.  Keep up the great work!!!!

## 2022-10-27 NOTE — Progress Notes (Signed)
Medical Nutrition Therapy  Appointment Start time:  [phone]  Appointment End time:  0850  Primary concerns today: Weight loss  Referral diagnosis: E66.9 - Obesity Preferred learning style: No preference indicated Learning readiness: Ready   NUTRITION ASSESSMENT   Anthropometrics  Ht: 5'10.5" Wt: 233.1 lbs BMI: 32.97 kg/m2  Weight Change: -4.7 lbs UBW: 232 - 236 lbs. Goal Weight: 200 lbs  Clinical Medical Hx: HTN, HLD, GERD Medications: Losartan, Hydrochlorothiazide, Rosuvastatin, Omeprazole, Labs: N/A Notable Signs/Symptoms: N/A   Lifestyle & Dietary Hx Pt reports trying to build meals to the proportion of the balanced plate, states it can be tough at times. Pt reports working on incorporating more vegetables into meals, smaller portions of protein and less starch-dense foods. Pt reports still snacking in the evenings but is having smaller portions and drinking water. Pt reports having Oreos less often, and will break them in half if they eat them. Pt reports occasionally snacking on PB crackers at work, switched to [employer]. Pt reports spending less on groceries now. Pt reports walking less lately, working more and bad weather have gotten in the way, usually. Pt reports sleeping with an extra pillow under their head, states they are experiencing less reflux.   Estimated daily fluid intake: 64 oz Supplements: Krill Oil, Vit D Sleep: Done sleep study, showed OSA, recommended CPAP Stress / self-care: Low Current average weekly physical activity: ADLs, somewhat active at work   24-Hr Dietary Recall First Meal: Peaches and cottage cheese Snack:  Second Meal: [place] meatloaf, green beans, water Snack: Apple, water Third Meal: [place] meatloaf sandwich, honey mustard chips Snack: Beverages: Water, coke   NUTRITION DIAGNOSIS  NB-1.1 Food and nutrition-related knowledge deficit As related to obesity.  As evidenced by BMI of 33.64 kg/m2, sedentary lifestyle, late day  snacking on energy dense foods/beverages.   NUTRITION INTERVENTION  Nutrition education (E-1) on the following topics:  Educated patient on the two components of energy balance: Energy in (calories), and energy out (activity). Explain the role of negative energy balance in weight loss. Discussed options with patient to achieve a negative energy balance and how to best control energy in and energy out to accommodate their lifestyle. Educated patient on the calorie content of macronutrients (carbs, protein, fat, alcohol), and choosing nutrient dense foods more often than energy dense foods. Educated patient on mindful eating, slowing down meals, and listening to hunger/satiety cues.  Handouts Provided Include  MyPlate Kcal per macronutrient GERD Nutrition Therapy  Learning Style & Readiness for Change Teaching method utilized: Visual & Auditory  Demonstrated degree of understanding via: Teach Back  Barriers to learning/adherence to lifestyle change: None  Goals Established by Pt When snacking in the evenings, have Cheez-its or animal crackers 1 at a time and be sure to chew and swallow each piece before having the next. Have a sip of water in between bites! Try to walk twice each week with some intensity to increase your rate of weight loss. Keep up the great work!!!!   MONITORING & EVALUATION Dietary intake, weekly physical activity, and weight change in 3 months.  Next Steps  Patient is to follow up with RD.

## 2022-11-03 DIAGNOSIS — G4733 Obstructive sleep apnea (adult) (pediatric): Secondary | ICD-10-CM | POA: Diagnosis not present

## 2022-11-03 DIAGNOSIS — E782 Mixed hyperlipidemia: Secondary | ICD-10-CM | POA: Diagnosis not present

## 2022-11-03 DIAGNOSIS — I2585 Chronic coronary microvascular dysfunction: Secondary | ICD-10-CM | POA: Diagnosis not present

## 2022-11-10 NOTE — Patient Instructions (Addendum)
Mitchell Bell  11/10/2022     @PREFPERIOPPHARMACY @   Your procedure is scheduled on  11/16/2022.   Report to [facility] at  0600 A.M.   Call this number if you have problems the morning of surgery:  [phone]  If you experience any cold or flu symptoms such as cough, fever, chills, shortness of breath, etc. between now and your scheduled surgery, please notify us at the above number.   Remember:  Do not eat after midnight.   You may drink clear liquids until 0330 am on 11/16/2022.     Clear liquids allowed are:                    Water, Juice (No red color; non-citric and without pulp; diabetics please choose diet or no sugar options), Carbonated beverages (diabetics please choose diet or no sugar options), Clear Tea (No creamer, milk, or cream, including half & half and powdered creamer), Black Coffee Only (No creamer, milk or cream, including half & half and powdered creamer), and Clear Sports drink (No red color; diabetics please choose diet or no sugar options)    Take these medicines the morning of surgery with A SIP OF WATER                               metoprolol, omeprazole.     Do not wear jewelry, make-up or nail polish, including gel polish,  artificial nails, or any other type of covering on natural nails (fingers and  toes).  Do not wear lotions, powders, or perfumes, or deodorant.  Do not shave 48 hours prior to surgery.  Men may shave face and neck.  Do not bring valuables to the hospital.  [facility] is not responsible for any belongings or valuables.  Contacts, dentures or bridgework may not be worn into surgery.  Leave your suitcase in the car.  After surgery it may be brought to your room.  For patients admitted to the hospital, discharge time will be determined by your treatment team.  Patients discharged the day of surgery will not be allowed to drive home and must have someone with them for 24 hours.    Special instructions:   DO NOT  smoke tobacco or vape for 24 hours before your procedure.  Please read over the following fact sheets that you were given. Coughing and Deep Breathing, Surgical Site Infection Prevention, Anesthesia Post-op Instructions, and Care and Recovery After Surgery         Transurethral Resection of the Prostate, Care After The following information offers guidance on how to care for yourself after your procedure. Your health care provider may also give you more specific instructions. If you have problems or questions, contact your health care provider. What can I expect after the procedure? After the procedure, it is common to have: Mild pain in your lower abdomen. Soreness or mild discomfort in your penis or when you urinate. This is from having the catheter inserted during the procedure. A sudden urge to urinate (urgency). A need to urinate often. A small amount of blood in your urine. You may notice some small blood clots in your urine. These are normal. Follow these instructions at home: Medicines Take over-the-counter and prescription medicines only as told by your health care provider. If you were prescribed an antibiotic medicine, take it as told by your health care provider. Do  not stop taking the antibiotic even if you start to feel better. Activity  Rest as told by your health care provider. Avoid sitting for a long time without moving. Get up to take short walks every 1-2 hours. This is important to improve blood flow and breathing. Ask for help if you feel weak or unsteady. You may increase your physical activity gradually as you start to feel better. Do not drive or operate machinery until your health care provider says that it is safe. Do not ride in a car for long periods of time, or as told by your health care provider. Avoid intense physical activity for as long as told by your health care provider. Do not lift anything that is heavier than 10 lb (4.5 kg), or the limit that you  are told, until your health care provider says that it is safe. Do not have sex until your health care provider approves. Return to your normal activities as told by your health care provider. Ask your health care provider what activities are safe for you. Preventing constipation You may need to take these actions to prevent or treat constipation: Drink enough fluid to keep your urine pale yellow. Take over-the-counter or prescription medicines. Eat foods that are high in fiber, such as beans, whole grains, and fresh fruits and vegetables. Limit foods that are high in fat and processed sugars, such as fried or sweet foods.  General instructions Do not strain when you have a bowel movement. Straining may lead to bleeding from the prostate. This may cause blood clots and trouble urinating. Do not use any products that contain nicotine or tobacco. These products include cigarettes, chewing tobacco, and vaping devices, such as e-cigarettes. If you need help quitting, ask your health care provider. If you go home with a tube draining your urine (urinary catheter), care for the catheter as told by your health care provider. Wear compression stockings as told by your health care provider. These stockings help to prevent blood clots and reduce swelling in your legs. Keep all follow-up visits. This is important. Contact a health care provider if: You have signs of infection, such as: Fever or chills. Urine that smells very bad. Swelling around your urethra that is getting worse. Swelling in your penis or testicles. You have difficulty urinating. You have pain that gets worse or does not improve with medicine. You have blood in your urine that does not go away after 1 week of resting and drinking more fluids. You have trouble having a bowel movement. You have trouble having or keeping an erection. No semen comes out during orgasm (dry ejaculation). You have a urinary catheter in place, and you  have: Spasms or pain. Problems with your catheter or your catheter is blocked. Get help right away if: You are unable to urinate. You are having more blood clots in your urine instead of fewer. You have: Large blood clots. A lot of blood in your urine. Pain in your back or lower abdomen. You have difficulty breathing or shortness of breath. You develop swelling or pain in your leg. These symptoms may be an emergency. Get help right away. Call 911. Do not wait to see if the symptoms will go away. Do not drive yourself to the hospital. Summary After the procedure, it is common to have a small amount of blood in your urine. Follow restrictions about lifting and sexual activity as told by your health care provider. Ask what activities are safe for you. Keep all  follow-up visits. This is important. This information is not intended to replace advice given to you by your health care provider. Make sure you discuss any questions you have with your health care provider. Document Revised: 10/05/2020 Document Reviewed: 10/05/2020 Elsevier Patient Education  2024 [employer] General Anesthesia, Adult, Care After The following information offers guidance on how to care for yourself after your procedure. Your health care provider may also give you more specific instructions. If you have problems or questions, contact your health care provider. What can I expect after the procedure? After the procedure, it is common for people to: Have pain or discomfort at the IV site. Have nausea or vomiting. Have a sore throat or hoarseness. Have trouble concentrating. Feel cold or chills. Feel weak, sleepy, or tired (fatigue). Have soreness and body aches. These can affect parts of the body that were not involved in surgery. Follow these instructions at home: For the time period you were told by your health care provider:  Rest. Do not participate in activities where you could fall or become injured. Do  not drive or use machinery. Do not drink alcohol. Do not take sleeping pills or medicines that cause drowsiness. Do not make important decisions or sign legal documents. Do not take care of children on your own. General instructions Drink enough fluid to keep your urine pale yellow. If you have sleep apnea, surgery and certain medicines can increase your risk for breathing problems. Follow instructions from your health care provider about wearing your sleep device: Anytime you are sleeping, including during daytime naps. While taking prescription pain medicines, sleeping medicines, or medicines that make you drowsy. Return to your normal activities as told by your health care provider. Ask your health care provider what activities are safe for you. Take over-the-counter and prescription medicines only as told by your health care provider. Do not use any products that contain nicotine or tobacco. These products include cigarettes, chewing tobacco, and vaping devices, such as e-cigarettes. These can delay incision healing after surgery. If you need help quitting, ask your health care provider. Contact a health care provider if: You have nausea or vomiting that does not get better with medicine. You vomit every time you eat or drink. You have pain that does not get better with medicine. You cannot urinate or have bloody urine. You develop a skin rash. You have a fever. Get help right away if: You have trouble breathing. You have chest pain. You vomit blood. These symptoms may be an emergency. Get help right away. Call 911. Do not wait to see if the symptoms will go away. Do not drive yourself to the hospital. Summary After the procedure, it is common to have a sore throat, hoarseness, nausea, vomiting, or to feel weak, sleepy, or fatigue. For the time period you were told by your health care provider, do not drive or use machinery. Get help right away if you have difficulty breathing, have  chest pain, or vomit blood. These symptoms may be an emergency. This information is not intended to replace advice given to you by your health care provider. Make sure you discuss any questions you have with your health care provider. Document Revised: 04/08/2021 Document Reviewed: 04/08/2021 Elsevier Patient Education  2024 [employer] How to Use Chlorhexidine Before Surgery Chlorhexidine gluconate (CHG) is a germ-killing (antiseptic) solution that is used to clean the skin. It can get rid of the bacteria that normally live on the skin and can keep them away for  about 24 hours. To clean your skin with CHG, you may be given: A CHG solution to use in the shower or as part of a sponge bath. A prepackaged cloth that contains CHG. Cleaning your skin with CHG may help lower the risk for infection: While you are staying in the intensive care unit of the hospital. If you have a vascular access, such as a central line, to provide short-term or long-term access to your veins. If you have a catheter to drain urine from your bladder. If you are on a ventilator. A ventilator is a machine that helps you breathe by moving air in and out of your lungs. After surgery. What are the risks? Risks of using CHG include: A skin reaction. Hearing loss, if CHG gets in your ears and you have a perforated eardrum. Eye injury, if CHG gets in your eyes and is not rinsed out. The CHG product catching fire. Make sure that you avoid smoking and flames after applying CHG to your skin. Do not use CHG: If you have a chlorhexidine allergy or have previously reacted to chlorhexidine. On babies younger than 6 months of age. How to use CHG solution Use CHG only as told by your health care provider, and follow the instructions on the label. Use the full amount of CHG as directed. Usually, this is one bottle. During a shower Follow these steps when using CHG solution during a shower (unless your health care provider gives  you different instructions): Start the shower. Use your normal soap and shampoo to wash your face and hair. Turn off the shower or move out of the shower stream. Pour the CHG onto a clean washcloth. Do not use any type of brush or rough-edged sponge. Starting at your neck, lather your body down to your toes. Make sure you follow these instructions: If you will be having surgery, pay special attention to the part of your body where you will be having surgery. Scrub this area for at least 1 minute. Do not use CHG on your head or face. If the solution gets into your ears or eyes, rinse them well with water. Avoid your genital area. Avoid any areas of skin that have broken skin, cuts, or scrapes. Scrub your back and under your arms. Make sure to wash skin folds. Let the lather sit on your skin for 1-2 minutes or as long as told by your health care provider. Thoroughly rinse your entire body in the shower. Make sure that all body creases and crevices are rinsed well. Dry off with a clean towel. Do not put any substances on your body afterward--such as powder, lotion, or perfume--unless you are told to do so by your health care provider. Only use lotions that are recommended by the manufacturer. Put on clean clothes or pajamas. If it is the night before your surgery, sleep in clean sheets.  During a sponge bath Follow these steps when using CHG solution during a sponge bath (unless your health care provider gives you different instructions): Use your normal soap and shampoo to wash your face and hair. Pour the CHG onto a clean washcloth. Starting at your neck, lather your body down to your toes. Make sure you follow these instructions: If you will be having surgery, pay special attention to the part of your body where you will be having surgery. Scrub this area for at least 1 minute. Do not use CHG on your head or face. If the solution gets into your ears or  eyes, rinse them well with water. Avoid  your genital area. Avoid any areas of skin that have broken skin, cuts, or scrapes. Scrub your back and under your arms. Make sure to wash skin folds. Let the lather sit on your skin for 1-2 minutes or as long as told by your health care provider. Using a different clean, wet washcloth, thoroughly rinse your entire body. Make sure that all body creases and crevices are rinsed well. Dry off with a clean towel. Do not put any substances on your body afterward--such as powder, lotion, or perfume--unless you are told to do so by your health care provider. Only use lotions that are recommended by the manufacturer. Put on clean clothes or pajamas. If it is the night before your surgery, sleep in clean sheets. How to use CHG prepackaged cloths Only use CHG cloths as told by your health care provider, and follow the instructions on the label. Use the CHG cloth on clean, dry skin. Do not use the CHG cloth on your head or face unless your health care provider tells you to. When washing with the CHG cloth: Avoid your genital area. Avoid any areas of skin that have broken skin, cuts, or scrapes. Before surgery Follow these steps when using a CHG cloth to clean before surgery (unless your health care provider gives you different instructions): Using the CHG cloth, vigorously scrub the part of your body where you will be having surgery. Scrub using a back-and-forth motion for 3 minutes. The area on your body should be completely wet with CHG when you are done scrubbing. Do not rinse. Discard the cloth and let the area air-dry. Do not put any substances on the area afterward, such as powder, lotion, or perfume. Put on clean clothes or pajamas. If it is the night before your surgery, sleep in clean sheets.  For general bathing Follow these steps when using CHG cloths for general bathing (unless your health care provider gives you different instructions). Use a separate CHG cloth for each area of your body.  Make sure you wash between any folds of skin and between your fingers and toes. Wash your body in the following order, switching to a new cloth after each step: The front of your neck, shoulders, and chest. Both of your arms, under your arms, and your hands. Your stomach and groin area, avoiding the genitals. Your right leg and foot. Your left leg and foot. The back of your neck, your back, and your buttocks. Do not rinse. Discard the cloth and let the area air-dry. Do not put any substances on your body afterward--such as powder, lotion, or perfume--unless you are told to do so by your health care provider. Only use lotions that are recommended by the manufacturer. Put on clean clothes or pajamas. Contact a health care provider if: Your skin gets irritated after scrubbing. You have questions about using your solution or cloth. You swallow any chlorhexidine. Call your local poison control center ([phone] in the [place] Get help right away if: Your eyes itch badly, or they become very red or swollen. Your skin itches badly and is red or swollen. Your hearing changes. You have trouble seeing. You have swelling or tingling in your mouth or throat. You have trouble breathing. These symptoms may represent a serious problem that is an emergency. Do not wait to see if the symptoms will go away. Get medical help right away. Call your local emergency services (911 in the [place] Do not drive yourself  to the hospital. Summary Chlorhexidine gluconate (CHG) is a germ-killing (antiseptic) solution that is used to clean the skin. Cleaning your skin with CHG may help to lower your risk for infection. You may be given CHG to use for bathing. It may be in a bottle or in a prepackaged cloth to use on your skin. Carefully follow your health care provider's instructions and the instructions on the product label. Do not use CHG if you have a chlorhexidine allergy. Contact your health care provider if your  skin gets irritated after scrubbing. This information is not intended to replace advice given to you by your health care provider. Make sure you discuss any questions you have with your health care provider. Document Revised: 05/09/2021 Document Reviewed: 03/22/2020 Elsevier Patient Education  2023 [employer].

## 2022-11-13 ENCOUNTER — Encounter (HOSPITAL_COMMUNITY)
Admission: RE | Admit: 2022-11-13 | Discharge: 2022-11-13 | Disposition: A | Discharge status: Home / Self Care | Payer: BC Managed Care – PPO | Source: Ambulatory Visit | Attending: Urology

## 2022-11-13 ENCOUNTER — Other Ambulatory Visit: Payer: Self-pay

## 2022-11-13 ENCOUNTER — Encounter (HOSPITAL_COMMUNITY): Payer: Self-pay

## 2022-11-13 VITALS — Ht 70.5 in | Wt 233.1 lb

## 2022-11-13 DIAGNOSIS — K219 Gastro-esophageal reflux disease without esophagitis: Secondary | ICD-10-CM | POA: Diagnosis not present

## 2022-11-13 DIAGNOSIS — Z01812 Encounter for preprocedural laboratory examination: Secondary | ICD-10-CM | POA: Insufficient documentation

## 2022-11-13 DIAGNOSIS — Z79899 Other long term (current) drug therapy: Secondary | ICD-10-CM | POA: Insufficient documentation

## 2022-11-13 DIAGNOSIS — R3911 Hesitancy of micturition: Secondary | ICD-10-CM | POA: Diagnosis not present

## 2022-11-13 DIAGNOSIS — N401 Enlarged prostate with lower urinary tract symptoms: Secondary | ICD-10-CM | POA: Diagnosis not present

## 2022-11-13 DIAGNOSIS — I10 Essential (primary) hypertension: Secondary | ICD-10-CM | POA: Diagnosis not present

## 2022-11-13 DIAGNOSIS — N138 Other obstructive and reflux uropathy: Secondary | ICD-10-CM | POA: Diagnosis not present

## 2022-11-13 DIAGNOSIS — R310 Gross hematuria: Secondary | ICD-10-CM | POA: Insufficient documentation

## 2022-11-13 LAB — CBC WITH DIFFERENTIAL/PLATELET
Abs Immature Granulocytes: 0 10*3/uL (ref 0.00–0.07)
Basophils Absolute: 0 10*3/uL (ref 0.0–0.1)
Basophils Relative: 0 %
Eosinophils Absolute: 0.1 10*3/uL (ref 0.0–0.5)
Eosinophils Relative: 1 %
HCT: 41.5 % (ref 39.0–52.0)
Hemoglobin: 14.6 g/dL (ref 13.0–17.0)
Immature Granulocytes: 0 %
Lymphocytes Relative: 54 %
Lymphs Abs: 2.7 10*3/uL (ref 0.7–4.0)
MCH: 31.6 pg (ref 26.0–34.0)
MCHC: 35.2 g/dL (ref 30.0–36.0)
MCV: 89.8 fL (ref 80.0–100.0)
Monocytes Absolute: 0.5 10*3/uL (ref 0.1–1.0)
Monocytes Relative: 10 %
Neutro Abs: 1.8 10*3/uL (ref 1.7–7.7)
Neutrophils Relative %: 35 %
Platelets: 144 10*3/uL — ABNORMAL LOW (ref 150–400)
RBC: 4.62 MIL/uL (ref 4.22–5.81)
RDW: 12.2 % (ref 11.5–15.5)
WBC: 5 10*3/uL (ref 4.0–10.5)
nRBC: 0 % (ref 0.0–0.2)

## 2022-11-13 LAB — BASIC METABOLIC PANEL
Anion gap: 8 (ref 5–15)
BUN: 16 mg/dL (ref 6–20)
CO2: 26 mmol/L (ref 22–32)
Calcium: 9.1 mg/dL (ref 8.9–10.3)
Chloride: 101 mmol/L (ref 98–111)
Creatinine, Ser: 0.71 mg/dL (ref 0.61–1.24)
GFR, Estimated: >60 mL/min (ref >60)
Glucose, Bld: 124 mg/dL — ABNORMAL HIGH (ref 70–99)
Potassium: 3.3 mmol/L — ABNORMAL LOW (ref 3.5–5.1)
Sodium: 135 mmol/L (ref 135–145)

## 2022-11-14 ENCOUNTER — Telehealth: Payer: Self-pay

## 2022-11-14 NOTE — Telephone Encounter (Signed)
Patient's wife is asking if his low platelets will be an issue or his surgery knowing that he should expect bleeding and clotting after surgery.  She is also asking how much pain he should expect to be in and how long he will be out of work.  Verbal from Dr. Ronne Binning that his platelet count should not be an issue with his upcoming procedure and that patient's surgery is not as invasive as his first surgery so his post operative symptoms should not be severe.  Patient's wife aware, she was also encouraged to ask Dr. Ronne Binning all other questions and address concerns the day of surgery.

## 2022-11-15 NOTE — Anesthesia Preprocedure Evaluation (Addendum)
Anesthesia Evaluation  Patient identified by MRN, date of birth, ID band Patient awake    Reviewed: Allergy & Precautions, NPO status , Patient's Chart, lab work & pertinent test results  Airway Mallampati: II  TM Distance: >3 FB Neck ROM: Full    Dental no notable dental hx. (+) Dental Advisory Given, Teeth Intact   Pulmonary neg pulmonary ROS Stop Bang 3   Pulmonary exam normal breath sounds clear to auscultation       Cardiovascular hypertension, Pt. on medications Normal cardiovascular exam Rhythm:Regular Rate:Normal     Neuro/Psych  PSYCHIATRIC DISORDERS Anxiety     negative neurological ROS     GI/Hepatic Neg liver ROS,GERD  Medicated,,  Endo/Other  negative endocrine ROS    Renal/GU negative Renal ROS  negative genitourinary   Musculoskeletal  (+) Arthritis , Osteoarthritis,    Abdominal Normal abdominal exam  (+)   Peds negative pediatric ROS (+) ADHD Hematology negative hematology ROS (+)   Anesthesia Other Findings   Reproductive/Obstetrics negative OB ROS                             Anesthesia Physical Anesthesia Plan  ASA: 2  Anesthesia Plan: General   Post-op Pain Management: Minimal or no pain anticipated   Induction: Intravenous  PONV Risk Score and Plan: 4 or greater and Ondansetron, Dexamethasone and Midazolam  Airway Management Planned: LMA  Additional Equipment:   Intra-op Plan:   Post-operative Plan:   Informed Consent: I have reviewed the patients History and Physical, chart, labs and discussed the procedure including the risks, benefits and alternatives for the proposed anesthesia with the patient or authorized representative who has indicated his/her understanding and acceptance.     Dental advisory given  Plan Discussed with: CRNA  Anesthesia Plan Comments:         Anesthesia Quick Evaluation

## 2022-11-16 ENCOUNTER — Ambulatory Visit (HOSPITAL_COMMUNITY): Payer: Self-pay | Admitting: Anesthesiology

## 2022-11-16 ENCOUNTER — Ambulatory Visit (HOSPITAL_COMMUNITY)
Admission: RE | Admit: 2022-11-16 | Discharge: 2022-11-16 | Disposition: A | Discharge status: Home / Self Care | Payer: BC Managed Care – PPO | Attending: Urology | Admitting: Urology

## 2022-11-16 ENCOUNTER — Encounter (HOSPITAL_COMMUNITY)
Admission: RE | Disposition: A | Discharge status: Home / Self Care | Payer: Self-pay | Source: Home / Self Care | Attending: Urology

## 2022-11-16 DIAGNOSIS — I10 Essential (primary) hypertension: Secondary | ICD-10-CM | POA: Insufficient documentation

## 2022-11-16 DIAGNOSIS — N401 Enlarged prostate with lower urinary tract symptoms: Secondary | ICD-10-CM | POA: Insufficient documentation

## 2022-11-16 DIAGNOSIS — N138 Other obstructive and reflux uropathy: Secondary | ICD-10-CM | POA: Diagnosis not present

## 2022-11-16 DIAGNOSIS — N400 Enlarged prostate without lower urinary tract symptoms: Secondary | ICD-10-CM | POA: Diagnosis not present

## 2022-11-16 DIAGNOSIS — Z79899 Other long term (current) drug therapy: Secondary | ICD-10-CM | POA: Insufficient documentation

## 2022-11-16 DIAGNOSIS — K219 Gastro-esophageal reflux disease without esophagitis: Secondary | ICD-10-CM | POA: Insufficient documentation

## 2022-11-16 DIAGNOSIS — N139 Obstructive and reflux uropathy, unspecified: Secondary | ICD-10-CM | POA: Diagnosis not present

## 2022-11-16 DIAGNOSIS — R3911 Hesitancy of micturition: Secondary | ICD-10-CM | POA: Insufficient documentation

## 2022-11-16 HISTORY — PX: CYSTOSCOPY: SHX5120

## 2022-11-16 HISTORY — PX: TRANSURETHRAL RESECTION OF PROSTATE: SHX73

## 2022-11-16 SURGERY — CYSTOSCOPY
Anesthesia: General | Site: Bladder

## 2022-11-16 MED ORDER — OXYCODONE HCL 5 MG PO TABS
5.0000 mg | ORAL_TABLET | Freq: Once | ORAL | Status: AC | PRN
  Administered 2022-11-16: 5 mg via ORAL
  Filled 2022-11-16: qty 1

## 2022-11-16 MED ORDER — WATER FOR IRRIGATION, STERILE IR SOLN
Status: DC | PRN
  Administered 2022-11-16: 500 mL via INTRAVESICAL

## 2022-11-16 MED ORDER — LACTATED RINGERS IV SOLN: INTRAVENOUS | Status: DC | PRN

## 2022-11-16 MED ORDER — PROPOFOL 10 MG/ML IV BOLUS
INTRAVENOUS | Status: DC | PRN
  Administered 2022-11-16: 150 mg via INTRAVENOUS

## 2022-11-16 MED ORDER — MIDAZOLAM HCL 2 MG/2ML IJ SOLN
INTRAMUSCULAR | Status: AC
Start: 2022-11-16 — End: ?
  Filled 2022-11-16: qty 2

## 2022-11-16 MED ORDER — SODIUM CHLORIDE 0.9 % IR SOLN
Status: DC | PRN
  Administered 2022-11-16: 3000 mL
  Administered 2022-11-16: 3000 mL via INTRAVESICAL
  Administered 2022-11-16: 6000 mL via INTRAVESICAL

## 2022-11-16 MED ORDER — GLYCOPYRROLATE PF 0.2 MG/ML IJ SOSY
PREFILLED_SYRINGE | INTRAMUSCULAR | Status: DC | PRN
  Administered 2022-11-16: .2 mg via INTRAVENOUS

## 2022-11-16 MED ORDER — DEXAMETHASONE SODIUM PHOSPHATE 10 MG/ML IJ SOLN
INTRAMUSCULAR | Status: AC
Start: 2022-11-16 — End: ?
  Filled 2022-11-16: qty 1

## 2022-11-16 MED ORDER — ORAL CARE MOUTH RINSE: 15.0000 mL | Freq: Once | OROMUCOSAL | Status: DC

## 2022-11-16 MED ORDER — LIDOCAINE HCL (CARDIAC) PF 100 MG/5ML IV SOSY
PREFILLED_SYRINGE | INTRAVENOUS | Status: DC | PRN
  Administered 2022-11-16: 100 mg via INTRAVENOUS

## 2022-11-16 MED ORDER — OXYCODONE HCL 5 MG/5ML PO SOLN: 5.0000 mg | Freq: Once | ORAL | Status: AC | PRN

## 2022-11-16 MED ORDER — FENTANYL CITRATE (PF) 100 MCG/2ML IJ SOLN
INTRAMUSCULAR | Status: DC | PRN
  Administered 2022-11-16 (×2): 50 ug via INTRAVENOUS

## 2022-11-16 MED ORDER — KETAMINE HCL 50 MG/5ML IJ SOSY
PREFILLED_SYRINGE | INTRAMUSCULAR | Status: AC
  Filled 2022-11-16: qty 5

## 2022-11-16 MED ORDER — SODIUM CHLORIDE 0.9 % IV SOLN
2.0000 g | INTRAVENOUS | Status: AC
  Administered 2022-11-16: 2 g via INTRAVENOUS
  Filled 2022-11-16: qty 20

## 2022-11-16 MED ORDER — FENTANYL CITRATE PF 50 MCG/ML IJ SOSY
25.0000 ug | PREFILLED_SYRINGE | INTRAMUSCULAR | Status: DC | PRN
  Administered 2022-11-16: 50 ug via INTRAVENOUS
  Filled 2022-11-16: qty 1

## 2022-11-16 MED ORDER — ONDANSETRON HCL 4 MG/2ML IJ SOLN
INTRAMUSCULAR | Status: DC | PRN
  Administered 2022-11-16: 4 mg via INTRAVENOUS

## 2022-11-16 MED ORDER — GLYCOPYRROLATE PF 0.2 MG/ML IJ SOSY
PREFILLED_SYRINGE | INTRAMUSCULAR | Status: AC
  Filled 2022-11-16: qty 1

## 2022-11-16 MED ORDER — ONDANSETRON HCL 4 MG/2ML IJ SOLN
4.0000 mg | Freq: Once | INTRAMUSCULAR | Status: DC | PRN
Start: 2022-11-16 — End: 2022-11-16

## 2022-11-16 MED ORDER — PROPOFOL 10 MG/ML IV BOLUS
INTRAVENOUS | Status: AC
Start: 2022-11-16 — End: ?
  Filled 2022-11-16: qty 20

## 2022-11-16 MED ORDER — CHLORHEXIDINE GLUCONATE 0.12 % MT SOLN
15.0000 mL | Freq: Once | OROMUCOSAL | Status: DC
  Filled 2022-11-16: qty 15

## 2022-11-16 MED ORDER — LIDOCAINE HCL (PF) 2 % IJ SOLN
INTRAMUSCULAR | Status: AC
  Filled 2022-11-16: qty 5

## 2022-11-16 MED ORDER — KETAMINE HCL 10 MG/ML IJ SOLN
INTRAMUSCULAR | Status: DC | PRN
  Administered 2022-11-16: 50 mg via INTRAVENOUS

## 2022-11-16 MED ORDER — HYDROCODONE-ACETAMINOPHEN 5-325 MG PO TABS: 1.0000 | ORAL_TABLET | Freq: Four times a day (QID) | ORAL | 0 refills | Status: DC | PRN

## 2022-11-16 MED ORDER — FENTANYL CITRATE (PF) 100 MCG/2ML IJ SOLN
INTRAMUSCULAR | Status: AC
  Filled 2022-11-16: qty 2

## 2022-11-16 MED ORDER — DEXAMETHASONE SODIUM PHOSPHATE 10 MG/ML IJ SOLN
INTRAMUSCULAR | Status: DC | PRN
  Administered 2022-11-16: 10 mg via INTRAVENOUS

## 2022-11-16 MED ORDER — MIDAZOLAM HCL 5 MG/5ML IJ SOLN
INTRAMUSCULAR | Status: DC | PRN
  Administered 2022-11-16: 2 mg via INTRAVENOUS

## 2022-11-16 MED ORDER — ONDANSETRON HCL 4 MG/2ML IJ SOLN
INTRAMUSCULAR | Status: AC
  Filled 2022-11-16: qty 2

## 2022-11-16 SURGICAL SUPPLY — 26 items
BAG DRAIN URO TABLE W/ADPT NS (BAG) ×1 IMPLANT
BAG DRN 8 ADPR NS SKTRN CSTL (BAG) ×1
BAG DRN URN TUBE DRIP CHMBR (OSTOMY) ×1
BAG HAMPER (MISCELLANEOUS) ×1 IMPLANT
BAG URINE DRAIN TURP 4L (OSTOMY) ×1 IMPLANT
CATH FOLEY 3WAY 30CC 22FR (CATHETERS) IMPLANT
ELECT REM PT RETURN 9FT ADLT (ELECTROSURGICAL) ×1
ELECTRODE REM PT RTRN 9FT ADLT (ELECTROSURGICAL) ×1 IMPLANT
GLOVE BIO SURGEON STRL SZ8 (GLOVE) ×1 IMPLANT
GLOVE BIOGEL PI IND STRL 7.0 (GLOVE) ×2 IMPLANT
GLOVE BIOGEL PI IND STRL 8 (GLOVE) IMPLANT
GOWN STRL REUS W/TWL LRG LVL3 (GOWN DISPOSABLE) ×2 IMPLANT
GOWN STRL REUS W/TWL XL LVL3 (GOWN DISPOSABLE) ×1 IMPLANT
IV NS IRRIG 3000ML ARTHROMATIC (IV SOLUTION) ×4 IMPLANT
KIT TURNOVER CYSTO (KITS) ×1 IMPLANT
LOOP CUT BIPOLAR 24F LRG (ELECTROSURGICAL) ×1 IMPLANT
MANIFOLD NEPTUNE II (INSTRUMENTS) ×1 IMPLANT
PACK CYSTO (CUSTOM PROCEDURE TRAY) ×1 IMPLANT
PAD ARMBOARD 7.5X6 YLW CONV (MISCELLANEOUS) ×1 IMPLANT
PLUG CATH AND CAP STER (CATHETERS) IMPLANT
POSITIONER HEAD 8X9X4 ADT (SOFTGOODS) ×1 IMPLANT
SYR 30ML LL (SYRINGE) ×1 IMPLANT
SYR TOOMEY IRRIG 70ML (MISCELLANEOUS) ×1
SYRINGE TOOMEY IRRIG 70ML (MISCELLANEOUS) ×1 IMPLANT
TOWEL OR 17X26 4PK STRL BLUE (TOWEL DISPOSABLE) ×1 IMPLANT
WATER STERILE IRR 500ML POUR (IV SOLUTION) ×1 IMPLANT

## 2022-11-16 NOTE — Anesthesia Postprocedure Evaluation (Signed)
Anesthesia Post Note  Patient: Mitchell Bell  Procedure(s) Performed: CYSTOSCOPY (Bladder) TRANSURETHRAL RESECTION OF THE PROSTATE (TURP) (Bladder)  Patient location during evaluation: PACU Anesthesia Type: General Level of consciousness: awake and alert Pain management: pain level controlled Vital Signs Assessment: post-procedure vital signs reviewed and stable Respiratory status: spontaneous breathing, nonlabored ventilation, respiratory function stable and patient connected to nasal cannula oxygen Cardiovascular status: blood pressure returned to baseline and stable Postop Assessment: no apparent nausea or vomiting Anesthetic complications: no   There were no known notable events for this encounter.   Last Vitals:  Vitals:   11/16/22 0830 11/16/22 0845  BP: 105/69 122/70  Pulse: (!) 56 61  Resp: (!) 9 (!) 9  Temp:    SpO2: 99% 100%    Last Pain:  Vitals:   11/16/22 0858  TempSrc:   PainSc: 4                  Cathyann Kilfoyle L Jagger Beahm

## 2022-11-16 NOTE — Discharge Instructions (Signed)
Irrigation of Catheter as needed: Use syringe and water to flush the bladder as needed to prevent catheter from clotting off and being able to drain into drainage bag. Increase fluids to stay hydrated. No driving or signing legal documents for 24 hours after anesthesia. If a fever greater than 101, pain unrelieved by pain medicine, or if catheter clots off with no change after irrigating, call Dr. Ronne Binning. Any other questions contact Dr. Dimas Millin office

## 2022-11-16 NOTE — Op Note (Signed)
Preoperative diagnosis: BPH  Postoperative diagnosis: BPH  Procedure: 1 cystoscopy 2. Transurethral resection of the prostate  Attending: Wilkie Aye  Anesthesia: General  Estimated blood loss: Minimal  Drains: 22 French foley  Specimens: 1. Prostate Chips  Antibiotics: Rocephin  Findings: Bilobar prostate enlargement. Ureteral orifices in normal anatomic location.   Indications: Patient is a 62 year old male with a history of BPH and difficulty urinating.  After discussing treatment options, they decided proceed with transurethral resection of the prostate.  Procedure in detail: The patient was brought to the operating room and a brief timeout was done to ensure correct patient, correct procedure, correct site.  General anesthesia was administered patient was placed in dorsal lithotomy position.  Their genitalia was then prepped and draped in usual sterile fashion.  A rigid 22 French cystoscope was passed in the urethra and the bladder.  Bladder was inspected and we noted no masses or lesions.  the ureteral orifices were in the normal orthotopic locations. removed the cystoscope and placed a resectoscope into the bladder. We then turned our attention to the prostate resection. Using the bipolar resectoscope we resected the median lobe first from the bladder neck to the verumontanum. We then started at the 12 oclock position on the left lobe and resection to the 6 o'clock position from the bladder neck to the verumontanum. We then did the same resection of the right lobe. Once the resection was complete we then cauterized individual bleeders. We then removed the prostate chips and sent them for pathology.  We then re-inspected the prostatic fossa and found no residual bleeding.  the bladder was then drained, a 22 French foley was placed and this concluded the procedure which was well tolerated by patient.  Complications: None  Condition: Stable, extubated, transferred to PACU  Plan:  Patient is admitted overnight with continuous bladder irrigation. If their urine is clear tomorrow they will be discharged home and followup in 5 days for foley catheter removal and pathology discussion.

## 2022-11-16 NOTE — Anesthesia Procedure Notes (Signed)
Procedure Name: LMA Insertion Date/Time: 11/16/2022 7:38 AM  Performed by: Jeanette Caprice, CRNAPre-anesthesia Checklist: Patient identified, Emergency Drugs available, Suction available and Patient being monitored Patient Re-evaluated:Patient Re-evaluated prior to induction Oxygen Delivery Method: Circle system utilized Preoxygenation: Pre-oxygenation with 100% oxygen Induction Type: IV induction LMA: LMA inserted LMA Size: 5.0 Tube type: Oral Number of attempts: 1 Placement Confirmation: positive ETCO2 and breath sounds checked- equal and bilateral Tube secured with: Tape Dental Injury: Teeth and Oropharynx as per pre-operative assessment

## 2022-11-16 NOTE — Transfer of Care (Signed)
Immediate Anesthesia Transfer of Care Note  Patient: Mitchell Bell  Procedure(s) Performed: CYSTOSCOPY (Bladder) TRANSURETHRAL RESECTION OF THE PROSTATE (TURP) (Bladder)  Patient Location: PACU  Anesthesia Type:General  Level of Consciousness: sedated  Airway & Oxygen Therapy: Patient Spontanous Breathing and Patient connected to face mask oxygen  Post-op Assessment: Report given to RN and Post -op Vital signs reviewed and stable  Post vital signs: Reviewed and stable  Last Vitals:  Vitals Value Taken Time  BP 105/69 11/16/22 0830  Temp 36.6 C 11/16/22 0828  Pulse 57 11/16/22 0832  Resp 10 11/16/22 0832  SpO2 99 % 11/16/22 0832  Vitals shown include unfiled device data.  Last Pain:  Vitals:   11/16/22 0634  TempSrc: Oral  PainSc: 0-No pain      Patients Stated Pain Goal: 7 (11/16/22 1610)  Complications: No notable events documented.

## 2022-11-16 NOTE — H&P (Signed)
HPI: Mitchell Bell is a 61yo here for TURP. IPSS 11 QOL 3 on no BPH therapy. He notes his stream has weakened significantly since last visit. He has urinary hesitancy which bothers him. No gross hematuria since last visit. He denies straining to urinate   PMH:     Past Medical History:  Diagnosis Date   ADHD (attention deficit hyperactivity disorder)     Anxiety     Arthritis     At risk for sleep apnea      STOP-BANG= 4      SENT TO PCP 09-10-2014   GERD (gastroesophageal reflux disease)     History of chicken pox     History of kidney stones     History of mumps as a child     Hypertension     Right knee pain      s/p  ORIF right patella fx 09-10-2014 w/ retained hardware   Schatzki's ring       per pt    Scoliosis      BACK PAIN          Surgical History:      Past Surgical History:  Procedure Laterality Date   CORONARY ANGIOGRAPHY N/A 01/06/2022    Procedure: CORONARY ANGIOGRAPHY;  Surgeon: Yates Decamp, MD;  [facility];  Service: Cardiovascular;  Laterality: N/A;   CYSTOSCOPY WITH INSERTION OF UROLIFT N/A 09/08/2021    Procedure: CYSTOSCOPY WITH INSERTION OF UROLIFT;  Surgeon: Malen Gauze, MD;  Location: [facility];  Service: Urology;  Laterality: N/A;   HARDWARE REMOVAL Right 04/07/2015    Procedure: HARDWARE REMOVAL AND BONE FRAGMENTS,RIGHT PATELLA;  Surgeon: Ollen Gross, MD;  Location: [facility];  Service: Orthopedics;  Laterality: Right;   INGUINAL HERNIA REPAIR Left age 42   KNEE ARTHROSCOPY Bilateral 2003   ORIF LEFT ANKLE FX   1990    retained hardware   ORIF PATELLA Right 09/10/2014    Procedure: OPEN REDUCTION INTERNAL (ORIF) RIGHT PERI  PATELLA FRACTURE;  Surgeon: Ollen Gross, MD;  Location: [facility];  Service: Orthopedics;  Laterality: Right;   TOTAL KNEE ARTHROPLASTY Bilateral 09/17/2013    Procedure: TOTAL KNEE BILATERAL;  Surgeon: Loanne Drilling, MD;  Location: [facility];  Service: Orthopedics;  Laterality: Bilateral;           Home Medications:  Allergies as of 07/14/2022         Reactions    Amlodipine Besylate Swelling    Ankle swelling             Medication List           Accurate as of July 14, 2022  9:05 AM. If you have any questions, ask your nurse or doctor.              hydrochlorothiazide 12.5 MG capsule Commonly known as: MICROZIDE Take 12.5 mg by mouth daily.    losartan 25 MG tablet Commonly known as: COZAAR Take 1 tablet (25 mg total) by mouth at bedtime.    MegaRed Omega-3 Krill Oil 350 MG Caps Take 350 mg by mouth daily.    metoprolol succinate 25 MG 24 hr tablet Commonly known as: Toprol XL Take 1 tablet (25 mg total) by mouth in the morning and at bedtime.    omeprazole 20 MG capsule Commonly known as: PRILOSEC Take 20 mg by mouth every morning.    rosuvastatin 20 MG tablet Commonly known as: CRESTOR Take 1 tablet (20 mg  total) by mouth at bedtime.    Vitamin D 50 MCG (2000 UT) tablet Take 4,000 Units by mouth in the morning and at bedtime.             Allergies:  Allergies       Allergies  Allergen Reactions   Amlodipine Besylate Swelling      Ankle swelling         Family History:      Family History  Problem Relation Age of Onset   Heart attack Father            Social History:  reports that he has never smoked. He has never used smokeless tobacco. He reports current alcohol use of about 1.0 standard drink of alcohol per week. He reports that he does not use drugs.   ROS: All other review of systems were reviewed and are negative except what is noted above in HPI   Physical Exam: BP 129/84   Pulse 67   Ht 5' 10.5" (1.791 m)   Wt 234 lb (106.1 kg)   BMI 33.10 kg/m   Constitutional:  Alert and oriented, No acute distress. HEENT: [place] AT, moist mucus membranes.  Trachea midline, no masses. Cardiovascular: No clubbing, cyanosis, or edema. Respiratory: Normal respiratory effort, no increased work of breathing. GI: Abdomen is soft,  nontender, nondistended, no abdominal masses GU: No CVA tenderness.  Lymph: No cervical or inguinal lymphadenopathy. Skin: No rashes, bruises or suspicious lesions. Neurologic: Grossly intact, no focal deficits, moving all 4 extremities. Psychiatric: Normal mood and affect.   Laboratory Data: Recent Labs       Lab Results  Component Value Date    WBC 5.1 01/06/2022    HGB 14.3 01/06/2022    HCT 40.2 01/06/2022    MCV 89.5 01/06/2022    PLT 162 01/06/2022        Recent Labs       Lab Results  Component Value Date    CREATININE 0.71 01/06/2022        Recent Labs  No results found for: "PSA"     Recent Labs  No results found for: "TESTOSTERONE"     Recent Labs  No results found for: "HGBA1C"     Urinalysis Labs (Brief)          Component Value Date/Time    COLORURINE YELLOW 09/10/2021 1145    APPEARANCEUR Clear 01/13/2022 0940    LABSPEC 1.012 09/10/2021 1145    PHURINE 6.0 09/10/2021 1145    GLUCOSEU Negative 01/13/2022 0940    HGBUR LARGE (A) 09/10/2021 1145    BILIRUBINUR Negative 01/13/2022 0940    KETONESUR NEGATIVE 09/10/2021 1145    PROTEINUR Negative 01/13/2022 0940    PROTEINUR 30 (A) 09/10/2021 1145    UROBILINOGEN 1.0 09/05/2013 0805    NITRITE Negative 01/13/2022 0940    NITRITE NEGATIVE 09/10/2021 1145    LEUKOCYTESUR Negative 01/13/2022 0940    LEUKOCYTESUR TRACE (A) 09/10/2021 1145        Recent Labs       Lab Results  Component Value Date    LABMICR See below: 01/13/2022    WBCUA None seen 01/13/2022    LABEPIT None seen 01/13/2022    MUCUS Present 08/19/2021    BACTERIA None seen 01/13/2022        Pertinent Imaging:   No results found for this or any previous visit.   No results found for this or any previous visit.   No results found for  this or any previous visit.   No results found for this or any previous visit.   No results found for this or any previous visit.   No valid procedures specified. No results found  for this or any previous visit.   No results found for this or any previous visit.     Assessment & Plan:     1. Benign prostatic hyperplasia with urinary obstruction We discussed the management of his BPH including continued medical therapy, Rezum, Urolift, TURP and simple prostatectomy. After discussing the options the patient has elected to proceed with TURP. Risks/benefits/alternatives discussed.

## 2022-11-20 ENCOUNTER — Encounter: Payer: Self-pay | Admitting: Urology

## 2022-11-20 ENCOUNTER — Ambulatory Visit (INDEPENDENT_AMBULATORY_CARE_PROVIDER_SITE_OTHER): Payer: BC Managed Care – PPO | Admitting: Urology

## 2022-11-20 VITALS — BP 103/73 | HR 74 | Temp 98.5°F

## 2022-11-20 DIAGNOSIS — Z09 Encounter for follow-up examination after completed treatment for conditions other than malignant neoplasm: Secondary | ICD-10-CM | POA: Diagnosis not present

## 2022-11-20 DIAGNOSIS — N138 Other obstructive and reflux uropathy: Secondary | ICD-10-CM

## 2022-11-20 DIAGNOSIS — Z87438 Personal history of other diseases of male genital organs: Secondary | ICD-10-CM

## 2022-11-20 LAB — SURGICAL PATHOLOGY

## 2022-11-20 MED ORDER — CIPROFLOXACIN HCL 500 MG PO TABS
500.0000 mg | ORAL_TABLET | Freq: Once | ORAL | Status: AC
  Administered 2022-11-20: 500 mg via ORAL

## 2022-11-20 NOTE — Progress Notes (Signed)
Name: Mitchell Bell DOB: [birth year] MRN: [id number]  Diagnoses: Post-operative state  HPI: Mitchell Bell presents post-operatively s/p TURP procedure by Dr. Ronne Binning on 11/16/2022.  Pathology:  A. PROSTATE TUMOR, TURP:  Benign prostatic hyperplasia   Postop course: Today He reports doing well overall. Reports some bladder spasms. Denies gross hematuria, flank pain, abdominal pain, fevers, nausea, or vomiting.   Fall Screening: Do you usually have a device to assist in your mobility? No   Medications: Current Outpatient Medications  Medication Sig Dispense Refill   Cholecalciferol (VITAMIN D) 50 MCG (2000 UT) tablet Take 2,000 Units by mouth in the morning and at bedtime.     hydrochlorothiazide (MICROZIDE) 12.5 MG capsule Take 12.5 mg by mouth daily.     HYDROcodone-acetaminophen (NORCO) 5-325 MG tablet Take 1 tablet by mouth every 6 (six) hours as needed for moderate pain (pain score 4-6). (Patient not taking: Reported on 11/20/2022) 15 tablet 0   losartan (COZAAR) 25 MG tablet Take 1 tablet (25 mg total) by mouth at bedtime. 90 tablet 3   MegaRed Omega-3 Krill Oil 350 MG CAPS Take 350 mg by mouth daily.     metoprolol succinate (TOPROL XL) 25 MG 24 hr tablet Take 1 tablet (25 mg total) by mouth in the morning and at bedtime. 60 tablet 6   omeprazole (PRILOSEC) 20 MG capsule Take 20 mg by mouth every morning.     rosuvastatin (CRESTOR) 20 MG tablet Take 1 tablet (20 mg total) by mouth at bedtime. 90 tablet 3   No current facility-administered medications for this visit.    Allergies: Allergies  Allergen Reactions   Amlodipine Besylate Swelling    Ankle swelling     Past Medical History:  Diagnosis Date   ADHD (attention deficit hyperactivity disorder)    Anxiety    Arthritis    At risk for sleep apnea    STOP-BANG= 4      SENT TO PCP 09-10-2014   GERD (gastroesophageal reflux disease)    History of chicken pox    History of kidney stones    History of mumps  as a child    Hypertension    Right knee pain    s/p  ORIF right patella fx 09-10-2014 w/ retained hardware   Schatzki's ring     per pt    Scoliosis    BACK PAIN   Past Surgical History:  Procedure Laterality Date   CORONARY ANGIOGRAPHY N/A 01/06/2022   Procedure: CORONARY ANGIOGRAPHY;  Surgeon: Yates Decamp, MD;  [facility];  Service: Cardiovascular;  Laterality: N/A;   CYSTOSCOPY WITH INSERTION OF UROLIFT N/A 09/08/2021   Procedure: CYSTOSCOPY WITH INSERTION OF UROLIFT;  Surgeon: Malen Gauze, MD;  Location: [facility];  Service: Urology;  Laterality: N/A;   HARDWARE REMOVAL Right 04/07/2015   Procedure: HARDWARE REMOVAL AND BONE FRAGMENTS,RIGHT PATELLA;  Surgeon: Ollen Gross, MD;  Location: [facility];  Service: Orthopedics;  Laterality: Right;   INGUINAL HERNIA REPAIR Left age 5   KNEE ARTHROSCOPY Bilateral 2003   ORIF LEFT ANKLE FX  1990   retained hardware   ORIF PATELLA Right 09/10/2014   Procedure: OPEN REDUCTION INTERNAL (ORIF) RIGHT PERI  PATELLA FRACTURE;  Surgeon: Ollen Gross, MD;  Location: [facility];  Service: Orthopedics;  Laterality: Right;   TOTAL KNEE ARTHROPLASTY Bilateral 09/17/2013   Procedure: TOTAL KNEE BILATERAL;  Surgeon: Loanne Drilling, MD;  Location: [facility];  Service: Orthopedics;  Laterality: Bilateral;  Family History  Problem Relation Age of Onset   Heart attack Father    Social History   Socioeconomic History   Marital status: Married    Spouse name: Not on file   Number of children: Not on file   Years of education: Not on file   Highest education level: Not on file  Occupational History   Not on file  Tobacco Use   Smoking status: Never   Smokeless tobacco: Never  Vaping Use   Vaping status: Never Used  Substance and Sexual Activity   Alcohol use: Yes    Alcohol/week: 1.0 standard drink of alcohol    Types: 1 Shots of liquor per week    Comment: 1 DRINK A WEEK/social   Drug use: No   Sexual  activity: Yes  Other Topics Concern   Not on file  Social History Narrative   Not on file   Social Determinants of Health   Financial Resource Strain: Not on file  Food Insecurity: Not on file  Transportation Needs: Not on file  Physical Activity: Not on file  Stress: Not on file  Social Connections: Unknown (06/07/2021)   Received from [facility]   Social Network    Social Network: Not on file  Intimate Partner Violence: Unknown (04/28/2021)   Received from [facility]   HITS    Physically Hurt: Not on file    Insult or Talk Down To: Not on file    Threaten Physical Harm: Not on file    Scream or Curse: Not on file    SUBJECTIVE  Review of Systems Constitutional: Patient denies any unintentional weight loss or change in strength lntegumentary: Patient denies any rashes or pruritus Cardiovascular: Patient denies chest pain or syncope Respiratory: Patient denies shortness of breath Gastrointestinal: Patient denies nausea, vomiting, constipation, or diarrhea Musculoskeletal: Patient denies muscle cramps or weakness Neurologic: Patient denies convulsions or seizures Allergic/Immunologic: Patient denies recent allergic reaction(s) Hematologic/Lymphatic: Patient denies bleeding tendencies Endocrine: Patient denies heat/cold intolerance  GU: As per HPI.  OBJECTIVE Vitals:   11/20/22 1132  BP: 103/73  Pulse: 74  Temp: 98.5 F (36.9 C)   There is no height or weight on file to calculate BMI.  Physical Examination Constitutional: No obvious distress; patient is non-toxic appearing  Cardiovascular: No visible lower extremity edema.  Respiratory: The patient does not have audible wheezing/stridor; respirations do not appear labored  Gastrointestinal: Abdomen non-distended Musculoskeletal: Normal ROM of UEs  Skin: No obvious rashes/open sores  Neurologic: CN 2-12 grossly intact Psychiatric: Answered questions appropriately with normal  affect  Hematologic/Lymphatic/Immunologic: No obvious bruises or sites of spontaneous bleeding   ASSESSMENT Benign prostatic hyperplasia with urinary obstruction - Plan: Bladder Voiding Trial, ciprofloxacin (CIPRO) tablet 500 mg  We reviewed the operative procedures and findings. Bladder spasms impaired voiding trial in office today. Dr. Ronne Binning was consulted and advised for Foley catheter to be removed. Advised nurse visit for PVR check in 2-3 days to ensure adequately bladder emptying. Will plan for follow up in 3 months or sooner if needed. Pt verbalized understanding and agreement. All questions were answered.  PLAN Advised the following: Foley catheter discontinued. Nurse visit for PVR check in 2-3 days. Return in about 3 months (around 02/20/2023) for UA, PVR, & f/u with Evette Georges NP.  Orders Placed This Encounter  Procedures   Bladder Voiding Trial    It has been explained that the patient is to follow regularly with their PCP in addition  to all other providers involved in their care and to follow instructions provided by these respective offices. Patient advised to contact urology clinic if any urologic-pertaining questions, concerns, new symptoms or problems arise in the interim period.  There are no Patient Instructions on file for this visit.  Electronically signed by:  Donnita Falls, MSN, FNP-C, CUNP 11/20/2022 1:15 PM

## 2022-11-20 NOTE — Progress Notes (Signed)
Fill and Pull Catheter Removal  Patient is present today for a catheter removal.  Patient was cleaned and prepped in a sterile fashion of sterile water/ saline was instilled into the bladder  twice when the patient felt the urge to urinate and before catheter could be removed the water came back through the syringe. Dr. Ronne Binning made aware and stated to remove the catheter.  30ml of water was then drained from the balloon.  A 22FR foley cath was removed from the bladder no complications were noted. Patient tolerated well.  Performed by: Tina Gruner LPN  Follow up/ Additional notes: per NP note.

## 2022-11-22 ENCOUNTER — Ambulatory Visit: Payer: BC Managed Care – PPO

## 2022-11-22 ENCOUNTER — Encounter (HOSPITAL_COMMUNITY): Payer: Self-pay | Admitting: Urology

## 2022-11-22 DIAGNOSIS — N138 Other obstructive and reflux uropathy: Secondary | ICD-10-CM

## 2022-11-22 DIAGNOSIS — N401 Enlarged prostate with lower urinary tract symptoms: Secondary | ICD-10-CM | POA: Diagnosis not present

## 2022-11-22 LAB — BLADDER SCAN AMB NON-IMAGING: Scan Result: 26

## 2022-11-22 NOTE — Progress Notes (Signed)
post void residual =19mL

## 2022-11-27 ENCOUNTER — Encounter: Payer: Self-pay | Admitting: Urology

## 2022-12-08 DIAGNOSIS — N400 Enlarged prostate without lower urinary tract symptoms: Secondary | ICD-10-CM | POA: Diagnosis not present

## 2022-12-08 DIAGNOSIS — Z1389 Encounter for screening for other disorder: Secondary | ICD-10-CM | POA: Diagnosis not present

## 2022-12-08 DIAGNOSIS — Z0000 Encounter for general adult medical examination without abnormal findings: Secondary | ICD-10-CM | POA: Diagnosis not present

## 2022-12-08 DIAGNOSIS — I10 Essential (primary) hypertension: Secondary | ICD-10-CM | POA: Diagnosis not present

## 2022-12-08 DIAGNOSIS — E785 Hyperlipidemia, unspecified: Secondary | ICD-10-CM | POA: Diagnosis not present

## 2022-12-08 DIAGNOSIS — K219 Gastro-esophageal reflux disease without esophagitis: Secondary | ICD-10-CM | POA: Diagnosis not present

## 2022-12-11 ENCOUNTER — Telehealth: Payer: Self-pay | Admitting: Neurology

## 2022-12-11 NOTE — Telephone Encounter (Signed)
Please advise patient that for moderate obstructive sleep apnea we recommend PAP therapy as first-line treatment.  If he would still rather consider an oral appliance, which is usually our second line option, you can put in a referral to dentistry on Dr. Oliva Bustard behalf.  I will be happy to sign the referral.

## 2022-12-11 NOTE — Telephone Encounter (Signed)
Pt is asking for a call to discuss options other than CPAP, please call.

## 2022-12-11 NOTE — Telephone Encounter (Signed)
Attempted to call the patient back, there was on answer. LVM asking for the pt to call back and advised I will send a mychart message as well.

## 2023-01-04 DIAGNOSIS — G4733 Obstructive sleep apnea (adult) (pediatric): Secondary | ICD-10-CM | POA: Diagnosis not present

## 2023-01-23 ENCOUNTER — Other Ambulatory Visit: Payer: Self-pay | Admitting: Internal Medicine

## 2023-01-24 ENCOUNTER — Encounter: Payer: Self-pay | Admitting: Neurology

## 2023-02-04 DIAGNOSIS — G4733 Obstructive sleep apnea (adult) (pediatric): Secondary | ICD-10-CM | POA: Diagnosis not present

## 2023-02-16 DIAGNOSIS — I10 Essential (primary) hypertension: Secondary | ICD-10-CM | POA: Diagnosis not present

## 2023-02-16 DIAGNOSIS — R7303 Prediabetes: Secondary | ICD-10-CM | POA: Diagnosis not present

## 2023-02-23 ENCOUNTER — Ambulatory Visit (INDEPENDENT_AMBULATORY_CARE_PROVIDER_SITE_OTHER): Payer: BC Managed Care – PPO | Admitting: Urology

## 2023-02-23 VITALS — BP 121/72 | HR 60

## 2023-02-23 DIAGNOSIS — N401 Enlarged prostate with lower urinary tract symptoms: Secondary | ICD-10-CM

## 2023-02-23 DIAGNOSIS — N138 Other obstructive and reflux uropathy: Secondary | ICD-10-CM | POA: Diagnosis not present

## 2023-02-23 DIAGNOSIS — R3912 Poor urinary stream: Secondary | ICD-10-CM

## 2023-02-23 LAB — URINALYSIS, ROUTINE W REFLEX MICROSCOPIC
Bilirubin, UA: NEGATIVE
Glucose, UA: NEGATIVE
Ketones, UA: NEGATIVE
Leukocytes,UA: NEGATIVE
Nitrite, UA: NEGATIVE
Protein,UA: NEGATIVE
Specific Gravity, UA: 1.01 (ref 1.005–1.030)
Urobilinogen, Ur: 0.2 mg/dL (ref 0.2–1.0)
pH, UA: 6 (ref 5.0–7.5)

## 2023-02-23 LAB — MICROSCOPIC EXAMINATION: Bacteria, UA: NONE SEEN

## 2023-02-23 NOTE — Progress Notes (Unsigned)
02/23/2023 1:24 PM   Mitchell Bell 05/01/1960 [id number]  Referring provider: Carin Hock, PA [address] [place],  [place] [address]  BPH   HPI: Mitchell Bell is a 62yo here for followup for BPH with a weak urinary stream.  IPSS 3 QOL 0 after TURP. He has urinary urgency which has been improving. Urine stream is strong. No straining to urinate. No other complaints today   PMH: Past Medical History:  Diagnosis Date   ADHD (attention deficit hyperactivity disorder)    Anxiety    Arthritis    At risk for sleep apnea    STOP-BANG= 4      SENT TO PCP 09-10-2014   GERD (gastroesophageal reflux disease)    History of chicken pox    History of kidney stones    History of mumps as a child    Hypertension    Right knee pain    s/p  ORIF right patella fx 09-10-2014 w/ retained hardware   Schatzki's ring     per pt    Scoliosis    BACK PAIN    Surgical History: Past Surgical History:  Procedure Laterality Date   CORONARY ANGIOGRAPHY N/A 01/06/2022   Procedure: CORONARY ANGIOGRAPHY;  Surgeon: Yates Decamp, MD;  [facility];  Service: Cardiovascular;  Laterality: N/A;   CYSTOSCOPY N/A 11/16/2022   Procedure: CYSTOSCOPY;  Surgeon: Malen Gauze, MD;  Location: [facility];  Service: Urology;  Laterality: N/A;   CYSTOSCOPY WITH INSERTION OF UROLIFT N/A 09/08/2021   Procedure: CYSTOSCOPY WITH INSERTION OF UROLIFT;  Surgeon: Malen Gauze, MD;  Location: [facility];  Service: Urology;  Laterality: N/A;   HARDWARE REMOVAL Right 04/07/2015   Procedure: HARDWARE REMOVAL AND BONE FRAGMENTS,RIGHT PATELLA;  Surgeon: Ollen Gross, MD;  Location: [facility];  Service: Orthopedics;  Laterality: Right;   INGUINAL HERNIA REPAIR Left age 75   KNEE ARTHROSCOPY Bilateral 2003   ORIF LEFT ANKLE FX  1990   retained hardware   ORIF PATELLA Right 09/10/2014   Procedure: OPEN REDUCTION INTERNAL (ORIF) RIGHT PERI  PATELLA FRACTURE;  Surgeon: Ollen Gross, MD;   Location: [facility];  Service: Orthopedics;  Laterality: Right;   TOTAL KNEE ARTHROPLASTY Bilateral 09/17/2013   Procedure: TOTAL KNEE BILATERAL;  Surgeon: Loanne Drilling, MD;  Location: [facility];  Service: Orthopedics;  Laterality: Bilateral;   TRANSURETHRAL RESECTION OF PROSTATE N/A 11/16/2022   Procedure: TRANSURETHRAL RESECTION OF THE PROSTATE (TURP);  Surgeon: Malen Gauze, MD;  Location: [facility];  Service: Urology;  Laterality: N/A;    Home Medications:  Allergies as of 02/23/2023       Reactions   Amlodipine Besylate Swelling   Ankle swelling         Medication List        Accurate as of February 23, 2023  1:24 PM. If you have any questions, ask your nurse or doctor.          hydrochlorothiazide 12.5 MG capsule Commonly known as: MICROZIDE Take 12.5 mg by mouth daily.   HYDROcodone-acetaminophen 5-325 MG tablet Commonly known as: Norco Take 1 tablet by mouth every 6 (six) hours as needed for moderate pain (pain score 4-6).   losartan 25 MG tablet Commonly known as: COZAAR Take 1 tablet (25 mg total) by mouth at bedtime.   MegaRed Omega-3 Krill Oil 350 MG Caps Take 350 mg by mouth daily.   metoprolol succinate 25 MG 24 hr tablet Commonly known as:  Toprol XL Take 1 tablet (25 mg total) by mouth in the morning and at bedtime.   omeprazole 20 MG capsule Commonly known as: PRILOSEC Take 20 mg by mouth every morning.   rosuvastatin 20 MG tablet Commonly known as: CRESTOR Take 1 tablet (20 mg total) by mouth at bedtime.   Vitamin D 50 MCG (2000 UT) tablet Take 2,000 Units by mouth in the morning and at bedtime.        Allergies:  Allergies  Allergen Reactions   Amlodipine Besylate Swelling    Ankle swelling     Family History: Family History  Problem Relation Age of Onset   Heart attack Father     Social History:  reports that he has never smoked. He has never used smokeless tobacco. He reports current alcohol use of about 1.0 standard drink of  alcohol per week. He reports that he does not use drugs.  ROS: All other review of systems were reviewed and are negative except what is noted above in HPI  Physical Exam: BP 121/72   Pulse 60   Constitutional:  Alert and oriented, No acute distress. HEENT: [place] AT, moist mucus membranes.  Trachea midline, no masses. Cardiovascular: No clubbing, cyanosis, or edema. Respiratory: Normal respiratory effort, no increased work of breathing. GI: Abdomen is soft, nontender, nondistended, no abdominal masses GU: No CVA tenderness.  Lymph: No cervical or inguinal lymphadenopathy. Skin: No rashes, bruises or suspicious lesions. Neurologic: Grossly intact, no focal deficits, moving all 4 extremities. Psychiatric: Normal mood and affect.  Laboratory Data: Lab Results  Component Value Date   WBC 5.0 11/13/2022   HGB 14.6 11/13/2022   HCT 41.5 11/13/2022   MCV 89.8 11/13/2022   PLT 144 (L) 11/13/2022    Lab Results  Component Value Date   CREATININE 0.71 11/13/2022    No results found for: "PSA"  No results found for: "TESTOSTERONE"  No results found for: "HGBA1C"  Urinalysis    Component Value Date/Time   COLORURINE YELLOW 09/10/2021 1145   APPEARANCEUR Clear 09/13/2022 1517   LABSPEC 1.012 09/10/2021 1145   PHURINE 6.0 09/10/2021 1145   GLUCOSEU Negative 09/13/2022 1517   HGBUR LARGE (A) 09/10/2021 1145   BILIRUBINUR Negative 09/13/2022 1517   KETONESUR NEGATIVE 09/10/2021 1145   PROTEINUR Negative 09/13/2022 1517   PROTEINUR 30 (A) 09/10/2021 1145   UROBILINOGEN 1.0 09/05/2013 0805   NITRITE Negative 09/13/2022 1517   NITRITE NEGATIVE 09/10/2021 1145   LEUKOCYTESUR Negative 09/13/2022 1517   LEUKOCYTESUR TRACE (A) 09/10/2021 1145    Lab Results  Component Value Date   LABMICR See below: 09/13/2022   WBCUA None seen 09/13/2022   LABEPIT None seen 09/13/2022   MUCUS Present 08/19/2021   BACTERIA None seen 09/13/2022    Pertinent Imaging:  No results found for  this or any previous visit.  No results found for this or any previous visit.  No results found for this or any previous visit.  No results found for this or any previous visit.  No results found for this or any previous visit.  No results found for this or any previous visit.  No results found for this or any previous visit.  No results found for this or any previous visit.   Assessment & Plan:    1. Benign prostatic hyperplasia with urinary obstruction (Primary) Followup 6 months - Urinalysis, Routine w reflex microscopic  2. Weak urinary stream -resolved after TURP   No follow-ups on file.  Wilkie Aye, MD  [facility] [place]

## 2023-02-27 ENCOUNTER — Encounter: Payer: Self-pay | Admitting: Urology

## 2023-02-27 NOTE — Patient Instructions (Signed)

## 2023-03-05 NOTE — Progress Notes (Signed)
Mitchell Bell

## 2023-03-06 ENCOUNTER — Encounter: Payer: Self-pay | Admitting: Neurology

## 2023-03-06 ENCOUNTER — Ambulatory Visit (INDEPENDENT_AMBULATORY_CARE_PROVIDER_SITE_OTHER): Payer: BC Managed Care – PPO | Admitting: Neurology

## 2023-03-06 VITALS — BP 131/71 | HR 58 | Ht 70.0 in | Wt 243.0 lb

## 2023-03-06 DIAGNOSIS — G478 Other sleep disorders: Secondary | ICD-10-CM

## 2023-03-06 DIAGNOSIS — G4733 Obstructive sleep apnea (adult) (pediatric): Secondary | ICD-10-CM | POA: Insufficient documentation

## 2023-03-06 NOTE — Progress Notes (Signed)
Provider:  Melvyn Novas, MD  Primary Care Physician:  Carin Hock, PA [address] [place] [address]     Referring Provider:Custovic, Rozell Searing, DO  [address]  [place], [place] [address]        Chief Complaint according to patient   Patient presents with:                HISTORY OF PRESENT ILLNESS:  Mitchell Bell is a 63 y.o. male patient who is here for revisit 03/06/2023 for  Here today for initial cpap follow up. DME [facility]. Overall doing well with. Still difficulty with sleeping through the night due to the mask moves etc. Has concerns of not feeling as rested in the morning as he did when he initially started .  Chief concern according to patient :  I am not feeling as refreshed as I started to do in Spring last year.    Mitchell Bell is a 63 y.o. male patient who is seen upon referral on 05/22/2022 from Dr Melton Alar, cardiology for a sleep consult .  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 "   06-27-2022:  This HST confirms the presence of moderate severe obstructive sleep apnea which was exacerbated and clearly dependent on REM sleep.  In addition there is a positional dependency as well.  The REM sleep component can only be addressed with positive airway pressure, supine component can be addressed with behavioral changes and with her request to avoid sleeping on the back.  I was surprised how little snoring was recorded.   RECOMMENDATION:   Auto- titrating CPAP with a setting between 5 and 16 cm water pressure, 2 cm EPR, heated humidifier and mask of patient's choice.  This patient has facial hair, please fit accordingly.   pAHI (per hour): 16.8/h                           REM pAHI: 26.2/h                                             NREM pAHI:   12/h                           Supine AHI: This patient slept 250 minutes in supine  position associated with an AHI of 23.4/h and an RDI of 32.2/h.  204 minutes of sleep were recorded in right lateral position associated with an AHI of only 6.2/h. Snoring statistics show a mean volume of snoring at only 40 dB and only for less than 9% of total sleep time.                                                  I have the pleasure of seeing Mitchell Bell on 05/22/22 a right-handed male with a possible sleep disorder.  Sleep relevant medical history: Nocturia, urge to urinate- 2 times, BPH, No Tonsillectomy, but epistaxis, snoring but no apnea witnessed.   Family medical /  sleep history: No other biological  family member on CPAP with OSA,  mother is alive at 19, father passed of MI at age 44.   Social history:  Patient is working as a Insurance claims handler, he makes valves for gas tanks, programming, daytime 6 AM-4 PM. Family status is married , with 2 grown children,no pets.   Tobacco use: none .  ETOH use ; socially-  2-3 drinks / week ,  Caffeine intake in form of Coffee( decaffeinated ) Soda( one coke in AM 12 ounces. ) Tea ( /) or energy drinks      Review of Systems: Out of a complete 14 system review, the patient complains of only the following symptoms, and all other reviewed systems are negative.:  Fatigue, sleepiness , snoring, fragmented sleep, Insomnia, RLS, Nocturia    How likely are you to doze in the following situations: 0 = not likely, 1 = slight chance, 2 = moderate chance, 3 = high chance   Sitting and Reading? Watching Television? Sitting inactive in a public place (theater or meeting)? As a passenger in a car for an hour without a break? Lying down in the afternoon when circumstances permit? Sitting and talking to someone? Sitting quietly after lunch without alcohol? In a car, while stopped for a few minutes in traffic?   Total = 6/ 24 points   FSS endorsed at 19/ 63 points.  He started to nap .  Feels better in AM and feels more fatigued in PM.  GDS 1/ 15 .    Social History   Socioeconomic History   Marital status: Married    Spouse name: Not on file   Number of children: Not on file   Years of education: Not on file   Highest education level: Not on file  Occupational History   Not on file  Tobacco Use   Smoking status: Never   Smokeless tobacco: Never  Vaping Use   Vaping status: Never Used  Substance and Sexual Activity   Alcohol use: Yes    Alcohol/week: 1.0 standard drink of alcohol    Types: 1 Shots of liquor per week    Comment: 1 DRINK A WEEK/social   Drug use: No   Sexual activity: Yes  Other Topics Concern   Not on file  Social History Narrative   Not on file   Social Drivers of Health   Financial Resource Strain: Not on file  Food Insecurity: Not on file  Transportation Needs: Not on file  Physical Activity: Not on file  Stress: Not on file  Social Connections: Unknown (06/07/2021)   [facility] from [facility]   Social Network    Social Network: Not on file    Family History  Problem Relation Age of Onset   Heart attack Father     Past Medical History:  Diagnosis Date   ADHD (attention deficit hyperactivity disorder)    Anxiety    Arthritis    At risk for sleep apnea    STOP-BANG= 4      SENT TO PCP 09-10-2014   GERD (gastroesophageal reflux disease)    History of chicken pox    History of kidney stones    History of mumps as a child    Hypertension    Right knee pain    s/p  ORIF right patella fx 09-10-2014 w/ retained hardware   Schatzki's ring     per pt    Scoliosis    BACK PAIN  Past Surgical History:  Procedure Laterality Date   CORONARY ANGIOGRAPHY N/A 01/06/2022   Procedure: CORONARY ANGIOGRAPHY;  Surgeon: Yates Decamp, MD;  [facility];  Service: Cardiovascular;  Laterality: N/A;   CYSTOSCOPY N/A 11/16/2022   Procedure: CYSTOSCOPY;  Surgeon: Malen Gauze, MD;  Location: [facility];  Service: Urology;  Laterality: N/A;   CYSTOSCOPY WITH  INSERTION OF UROLIFT N/A 09/08/2021   Procedure: CYSTOSCOPY WITH INSERTION OF UROLIFT;  Surgeon: Malen Gauze, MD;  Location: [facility];  Service: Urology;  Laterality: N/A;   HARDWARE REMOVAL Right 04/07/2015   Procedure: HARDWARE REMOVAL AND BONE FRAGMENTS,RIGHT PATELLA;  Surgeon: Ollen Gross, MD;  [facility];  Service: Orthopedics;  Laterality: Right;   INGUINAL HERNIA REPAIR Left age 58   KNEE ARTHROSCOPY Bilateral 2003   ORIF LEFT ANKLE FX  1990   retained hardware   ORIF PATELLA Right 09/10/2014   Procedure: OPEN REDUCTION INTERNAL (ORIF) RIGHT PERI  PATELLA FRACTURE;  Surgeon: Ollen Gross, MD;  Location: [facility];  Service: Orthopedics;  Laterality: Right;   TOTAL KNEE ARTHROPLASTY Bilateral 09/17/2013   Procedure: TOTAL KNEE BILATERAL;  Surgeon: Loanne Drilling, MD;  Location: [facility];  Service: Orthopedics;  Laterality: Bilateral;   TRANSURETHRAL RESECTION OF PROSTATE N/A 11/16/2022   Procedure: TRANSURETHRAL RESECTION OF THE PROSTATE (TURP);  Surgeon: Malen Gauze, MD;  Location: [facility];  Service: Urology;  Laterality: N/A;     Current Outpatient Medications on File Prior to Visit  Medication Sig Dispense Refill   Cholecalciferol (VITAMIN D) 50 MCG (2000 UT) tablet Take 2,000 Units by mouth in the morning and at bedtime.     hydrochlorothiazide (MICROZIDE) 12.5 MG capsule Take 12.5 mg by mouth daily.     MegaRed Omega-3 Krill Oil 350 MG CAPS Take 350 mg by mouth daily.     metoprolol succinate (TOPROL XL) 25 MG 24 hr tablet Take 1 tablet (25 mg total) by mouth in the morning and at bedtime. 60 tablet 6   omeprazole (PRILOSEC) 20 MG capsule Take 20 mg by mouth every morning.     losartan (COZAAR) 25 MG tablet Take 1 tablet (25 mg total) by mouth at bedtime. 90 tablet 3   rosuvastatin (CRESTOR) 20 MG tablet Take 1 tablet (20 mg total) by mouth at bedtime. 90 tablet 3   No current facility-administered medications on file prior to visit.     Allergies  Allergen Reactions   Amlodipine Besylate Swelling    Ankle swelling      DIAGNOSTIC DATA (LABS, IMAGING, TESTING) - I reviewed patient records, labs, notes, testing and imaging myself where available.  Lab Results  Component Value Date   WBC 5.0 11/13/2022   HGB 14.6 11/13/2022   HCT 41.5 11/13/2022   MCV 89.8 11/13/2022   PLT 144 (L) 11/13/2022      Component Value Date/Time   NA 135 11/13/2022 1548   NA 138 01/05/2022 1114   K 3.3 (L) 11/13/2022 1548   CL 101 11/13/2022 1548   CO2 26 11/13/2022 1548   GLUCOSE 124 (H) 11/13/2022 1548   BUN 16 11/13/2022 1548   BUN 11 01/05/2022 1114   CREATININE 0.71 11/13/2022 1548   CALCIUM 9.1 11/13/2022 1548   PROT 6.7 09/05/2013 0850   ALBUMIN 3.7 09/05/2013 0850   AST 16 09/05/2013 0850   ALT 23 09/05/2013 0850   ALKPHOS 71 09/05/2013 0850   BILITOT 0.9 09/05/2013 0850   GFRNONAA >60 11/13/2022 1548  GFRAA >60 09/09/2014 1045   No results found for: "CHOL", "HDL", "LDLCALC", "LDLDIRECT", "TRIG", "CHOLHDL" No results found for: "HGBA1C" No results found for: "VITAMINB12" No results found for: "TSH"  PHYSICAL EXAM:  Today's Vitals   03/06/23 1542  BP: 131/71  Pulse: (!) 58  Weight: 243 lb (110.2 kg)  Height: 5\' 10"  (1.778 m)   Body mass index is 34.87 kg/m.   Wt Readings from Last 3 Encounters:  03/06/23 243 lb (110.2 kg)  11/16/22 233 lb 0.4 oz (105.7 kg)  11/13/22 233 lb 1.6 oz (105.7 kg)     Ht Readings from Last 3 Encounters:  03/06/23 5\' 10"  (1.778 m)  11/16/22 5' 10.5" (1.791 m)  11/13/22 5' 10.5" (1.791 m)      G The patient is awake, alert and appears not in acute distress. The patient is well groomed. Head: Normocephalic, atraumatic. Neck is supple.  Mallampati 2-3,  neck circumference:18.5 inches .  Nasal airflow patent.   Retrognathia is not seen. Facial hair.  Dental status: biological  Cardiovascular:  Regular rate and cardiac rhythm by pulse,  without distended neck  veins. Respiratory: Lungs are clear to auscultation.  Skin:  Without evidence of ankle edema, or rash. Trunk: The patient's posture is erect.   NEUROLOGIC EXAM: The patient is awake and alert, oriented to place and time.   Memory subjective described as intact.  Attention span & concentration ability appears normal.  Speech is fluent,  without  dysarthria, dysphonia or aphasia.  Mood and affect are appropriate.   Cranial nerves: no loss of smell or taste reported  Pupils are equal and briskly reactive to light. Funduscopic exam deferred. .  Extraocular movements in vertical and horizontal planes were intact and without nystagmus. No Diplopia. Visual fields by finger perimetry are intact. Hearing was intact to soft voice.    Facial sensation intact to fine touch.  Facial motor strength is symmetric and tongue and uvula move midline.  Neck ROM : rotation, tilt and flexion extension were normal for age and shoulder shrug was symmetrical.    Motor exam:  Symmetric bulk, tone and ROM.   Normal tone without cog- wheeling, symmetric grip strength .   Sensory:  def.   Coordination: without evidence of ataxia, dysmetria or tremor.   Gait and station: Patient could rise unassisted from a seated position, walked without assistive device.     ASSESSMENT AND PLAN 63 y.o. year old male  here with:  he broke his jaw a long time ago, and wonders I that is a mask fit concern.   Before his sleep study he underwent a uro-lift,  and he had a revision just  November 13 2022,  he needed 2 months to fully recover.   OSA on CPAP.  100% compliance, has a setting from 5-16 cm water and needs only 12 cm max- can also s reset EPR fro 2 to 3 cm water.    Works early shift , early start at 6 AM, rises at 4.05 AM. He has  changed his diet and exercise routine ,Has prediabetes.    1) some concerns about being woken by air leak , when the mask gets dislodged.   2)he is sleepier  in comparison to pre CPAP   questionnaire .  Yet, more restfull sleep for him and his wife now.  !   3) no more snoring !!!.     I plan to follow up  through our NP within 12 months.  I would like to thank Carin Hock, Pa [address] [place],  [place] [address] for allowing me to meet with and to take care of this pleasant patient.     After spending a total time of  30  minutes face to face and additional time for physical and neurologic examination, review of laboratory studies,  personal review of imaging studies, reports and results of other testing and review of referral information / records as far as provided in visit,   Electronically signed by: Melvyn Novas, MD 03/06/2023 4:08 PM  [facility] Board certified by The [employer] of Sleep Medicine and Diplomate of the [employer] of Sleep Medicine. Board certified In Neurology through the ABPN, Fellow of the [employer] of Neurology.

## 2023-03-07 DIAGNOSIS — G4733 Obstructive sleep apnea (adult) (pediatric): Secondary | ICD-10-CM | POA: Diagnosis not present

## 2023-03-23 DIAGNOSIS — R7303 Prediabetes: Secondary | ICD-10-CM | POA: Diagnosis not present

## 2023-04-04 DIAGNOSIS — G4733 Obstructive sleep apnea (adult) (pediatric): Secondary | ICD-10-CM | POA: Diagnosis not present

## 2023-05-04 DIAGNOSIS — I2585 Chronic coronary microvascular dysfunction: Secondary | ICD-10-CM | POA: Diagnosis not present

## 2023-05-04 DIAGNOSIS — I2089 Other forms of angina pectoris: Secondary | ICD-10-CM | POA: Diagnosis not present

## 2023-05-04 DIAGNOSIS — G4733 Obstructive sleep apnea (adult) (pediatric): Secondary | ICD-10-CM | POA: Diagnosis not present

## 2023-05-04 DIAGNOSIS — E782 Mixed hyperlipidemia: Secondary | ICD-10-CM | POA: Diagnosis not present

## 2023-06-04 DIAGNOSIS — G4733 Obstructive sleep apnea (adult) (pediatric): Secondary | ICD-10-CM | POA: Diagnosis not present

## 2023-06-11 DIAGNOSIS — D225 Melanocytic nevi of trunk: Secondary | ICD-10-CM | POA: Diagnosis not present

## 2023-06-11 DIAGNOSIS — D2262 Melanocytic nevi of left upper limb, including shoulder: Secondary | ICD-10-CM | POA: Diagnosis not present

## 2023-06-11 DIAGNOSIS — B079 Viral wart, unspecified: Secondary | ICD-10-CM | POA: Diagnosis not present

## 2023-06-11 DIAGNOSIS — D485 Neoplasm of uncertain behavior of skin: Secondary | ICD-10-CM | POA: Diagnosis not present

## 2023-06-11 DIAGNOSIS — L821 Other seborrheic keratosis: Secondary | ICD-10-CM | POA: Diagnosis not present

## 2023-06-11 DIAGNOSIS — D2261 Melanocytic nevi of right upper limb, including shoulder: Secondary | ICD-10-CM | POA: Diagnosis not present

## 2023-07-05 DIAGNOSIS — G4733 Obstructive sleep apnea (adult) (pediatric): Secondary | ICD-10-CM | POA: Diagnosis not present

## 2023-07-13 DIAGNOSIS — G4733 Obstructive sleep apnea (adult) (pediatric): Secondary | ICD-10-CM | POA: Diagnosis not present

## 2023-08-04 DIAGNOSIS — G4733 Obstructive sleep apnea (adult) (pediatric): Secondary | ICD-10-CM | POA: Diagnosis not present

## 2023-08-10 DIAGNOSIS — R7303 Prediabetes: Secondary | ICD-10-CM | POA: Diagnosis not present

## 2023-09-04 DIAGNOSIS — G4733 Obstructive sleep apnea (adult) (pediatric): Secondary | ICD-10-CM | POA: Diagnosis not present

## 2023-10-05 DIAGNOSIS — G4733 Obstructive sleep apnea (adult) (pediatric): Secondary | ICD-10-CM | POA: Diagnosis not present

## 2023-11-02 DIAGNOSIS — E782 Mixed hyperlipidemia: Secondary | ICD-10-CM | POA: Diagnosis not present

## 2023-11-02 DIAGNOSIS — I2089 Other forms of angina pectoris: Secondary | ICD-10-CM | POA: Diagnosis not present

## 2023-11-02 DIAGNOSIS — R001 Bradycardia, unspecified: Secondary | ICD-10-CM | POA: Diagnosis not present

## 2023-11-02 DIAGNOSIS — G4733 Obstructive sleep apnea (adult) (pediatric): Secondary | ICD-10-CM | POA: Diagnosis not present

## 2023-12-28 DIAGNOSIS — E785 Hyperlipidemia, unspecified: Secondary | ICD-10-CM | POA: Diagnosis not present

## 2023-12-28 DIAGNOSIS — Z0000 Encounter for general adult medical examination without abnormal findings: Secondary | ICD-10-CM | POA: Diagnosis not present

## 2023-12-28 DIAGNOSIS — N400 Enlarged prostate without lower urinary tract symptoms: Secondary | ICD-10-CM | POA: Diagnosis not present

## 2023-12-28 DIAGNOSIS — I10 Essential (primary) hypertension: Secondary | ICD-10-CM | POA: Diagnosis not present

## 2023-12-28 DIAGNOSIS — G4733 Obstructive sleep apnea (adult) (pediatric): Secondary | ICD-10-CM | POA: Diagnosis not present

## 2023-12-28 DIAGNOSIS — R7303 Prediabetes: Secondary | ICD-10-CM | POA: Diagnosis not present

## 2024-01-03 ENCOUNTER — Telehealth: Payer: Self-pay | Admitting: Adult Health

## 2024-01-03 NOTE — Telephone Encounter (Signed)
 LVM and sent mychart msg informing pt of need to reschedule 03/05/24 appt - NP out

## 2024-02-22 ENCOUNTER — Encounter: Payer: Self-pay | Admitting: Urology

## 2024-02-22 ENCOUNTER — Ambulatory Visit: Payer: BC Managed Care – PPO | Admitting: Urology

## 2024-02-22 VITALS — BP 113/66 | HR 63

## 2024-02-22 DIAGNOSIS — N401 Enlarged prostate with lower urinary tract symptoms: Secondary | ICD-10-CM | POA: Diagnosis not present

## 2024-02-22 DIAGNOSIS — R310 Gross hematuria: Secondary | ICD-10-CM

## 2024-02-22 DIAGNOSIS — N138 Other obstructive and reflux uropathy: Secondary | ICD-10-CM | POA: Diagnosis not present

## 2024-02-22 DIAGNOSIS — R3912 Poor urinary stream: Secondary | ICD-10-CM

## 2024-02-22 LAB — URINALYSIS, ROUTINE W REFLEX MICROSCOPIC
Bilirubin, UA: NEGATIVE
Glucose, UA: NEGATIVE
Ketones, UA: NEGATIVE
Leukocytes,UA: NEGATIVE
Nitrite, UA: NEGATIVE
Protein,UA: NEGATIVE
Specific Gravity, UA: 1.01 (ref 1.005–1.030)
Urobilinogen, Ur: 1 mg/dL (ref 0.2–1.0)
pH, UA: 6 (ref 5.0–7.5)

## 2024-02-22 LAB — MICROSCOPIC EXAMINATION
Bacteria, UA: NONE SEEN
WBC, UA: NONE SEEN /HPF (ref 0–5)

## 2024-02-22 MED ORDER — PSEUDOEPHEDRINE HCL 60 MG PO TABS: 60.0000 mg | ORAL_TABLET | Freq: Three times a day (TID) | ORAL | 4 refills | Status: AC | PRN

## 2024-02-22 NOTE — Progress Notes (Signed)
 "  02/22/2024 12:32 PM   Mitchell Bell 09/27/1960 [id number]  Referring provider: Sheldon Netter, PA [address] [place],  [place] [address]  Followup BPH   HPI: Mr Mitchell Bell is a 64yo here for followup for BPh with a weak urinary stream and gross hematuria. IPSS 3 QOL 1 after TURP. He has issues with retrograde ejaculation which is bothersome to him. He has has occasional urinary urgency.  His urine stream is strong   PMH: Past Medical History:  Diagnosis Date   ADHD (attention deficit hyperactivity disorder)    Anxiety    Arthritis    At risk for sleep apnea    STOP-BANG= 4      SENT TO PCP 09-10-2014   GERD (gastroesophageal reflux disease)    History of chicken pox    History of kidney stones    History of mumps as a child    Hypertension    Right knee pain    s/p  ORIF right patella fx 09-10-2014 w/ retained hardware   Schatzki's ring     per pt    Scoliosis    BACK PAIN    Surgical History: Past Surgical History:  Procedure Laterality Date   CORONARY ANGIOGRAPHY N/A 01/06/2022   Procedure: CORONARY ANGIOGRAPHY;  Surgeon: Ladona Heinz, MD;  [facility];  Service: Cardiovascular;  Laterality: N/A;   CYSTOSCOPY N/A 11/16/2022   Procedure: CYSTOSCOPY;  Surgeon: Sherrilee Belvie CROME, MD;  Location: [facility];  Service: Urology;  Laterality: N/A;   CYSTOSCOPY WITH INSERTION OF UROLIFT N/A 09/08/2021   Procedure: CYSTOSCOPY WITH INSERTION OF UROLIFT;  Surgeon: Sherrilee Belvie CROME, MD;  Location: [facility];  Service: Urology;  Laterality: N/A;   HARDWARE REMOVAL Right 04/07/2015   Procedure: HARDWARE REMOVAL AND BONE FRAGMENTS,RIGHT PATELLA;  Surgeon: Dempsey Moan, MD;  [facility];  Service: Orthopedics;  Laterality: Right;   INGUINAL HERNIA REPAIR Left age 64   KNEE ARTHROSCOPY Bilateral 2003   ORIF LEFT ANKLE FX  1990   retained hardware   ORIF PATELLA Right 09/10/2014   Procedure: OPEN REDUCTION INTERNAL (ORIF) RIGHT PERI   PATELLA FRACTURE;  Surgeon: Dempsey Moan, MD;  Location: [facility];  Service: Orthopedics;  Laterality: Right;   TOTAL KNEE ARTHROPLASTY Bilateral 09/17/2013   Procedure: TOTAL KNEE BILATERAL;  Surgeon: Dempsey Moan GAILS, MD;  Location: [facility];  Service: Orthopedics;  Laterality: Bilateral;   TRANSURETHRAL RESECTION OF PROSTATE N/A 11/16/2022   Procedure: TRANSURETHRAL RESECTION OF THE PROSTATE (TURP);  Surgeon: Sherrilee Belvie CROME, MD;  Location: [facility];  Service: Urology;  Laterality: N/A;    Home Medications:  Allergies as of 02/22/2024       Reactions   Amlodipine Besylate Swelling   Ankle swelling         Medication List        Accurate as of February 22, 2024 12:32 PM. If you have any questions, ask your nurse or doctor.          hydrochlorothiazide  12.5 MG capsule Commonly known as: MICROZIDE  Take 12.5 mg by mouth daily.   losartan  25 MG tablet Commonly known as: COZAAR  Take 1 tablet (25 mg total) by mouth at bedtime.   MegaRed Omega-3 Krill Oil 350 MG Caps Take 350 mg by mouth daily.   metoprolol  succinate 25 MG 24 hr tablet Commonly known as: Toprol  XL Take 1 tablet (25 mg total) by mouth in the morning and at bedtime.   omeprazole  20 MG  capsule Commonly known as: PRILOSEC Take 20 mg by mouth every morning.   rosuvastatin  20 MG tablet Commonly known as: CRESTOR  Take 1 tablet (20 mg total) by mouth at bedtime.   Vitamin D 50 MCG (2000 UT) tablet Take 2,000 Units by mouth in the morning and at bedtime.        Allergies: Allergies[1]  Family History: Family History  Problem Relation Age of Onset   Heart attack Father     Social History:  reports that he has never smoked. He has never used smokeless tobacco. He reports current alcohol use of about 1.0 standard drink of alcohol per week. He reports that he does not use drugs.  ROS: All other review of systems were reviewed and are negative except what is noted above in HPI  Physical Exam: BP 113/66    Pulse 63   Constitutional:  Alert and oriented, No acute distress. HEENT: [place] AT, moist mucus membranes.  Trachea midline, no masses. Cardiovascular: No clubbing, cyanosis, or edema. Respiratory: Normal respiratory effort, no increased work of breathing. GI: Abdomen is soft, nontender, nondistended, no abdominal masses GU: No CVA tenderness.  Lymph: No cervical or inguinal lymphadenopathy. Skin: No rashes, bruises or suspicious lesions. Neurologic: Grossly intact, no focal deficits, moving all 4 extremities. Psychiatric: Normal mood and affect.  Laboratory Data: Lab Results  Component Value Date   WBC 5.0 11/13/2022   HGB 14.6 11/13/2022   HCT 41.5 11/13/2022   MCV 89.8 11/13/2022   PLT 144 (L) 11/13/2022    Lab Results  Component Value Date   CREATININE 0.71 11/13/2022    No results found for: PSA  No results found for: TESTOSTERONE  No results found for: HGBA1C  Urinalysis    Component Value Date/Time   COLORURINE YELLOW 09/10/2021 1145   APPEARANCEUR Clear 02/23/2023 1259   LABSPEC 1.012 09/10/2021 1145   PHURINE 6.0 09/10/2021 1145   GLUCOSEU Negative 02/23/2023 1259   HGBUR LARGE (A) 09/10/2021 1145   BILIRUBINUR Negative 02/23/2023 1259   KETONESUR NEGATIVE 09/10/2021 1145   PROTEINUR Negative 02/23/2023 1259   PROTEINUR 30 (A) 09/10/2021 1145   UROBILINOGEN 1.0 09/05/2013 0805   NITRITE Negative 02/23/2023 1259   NITRITE NEGATIVE 09/10/2021 1145   LEUKOCYTESUR Negative 02/23/2023 1259   LEUKOCYTESUR TRACE (A) 09/10/2021 1145    Lab Results  Component Value Date   LABMICR See below: 02/23/2023   WBCUA 0-5 02/23/2023   LABEPIT 0-10 02/23/2023   MUCUS Present 08/19/2021   BACTERIA None seen 02/23/2023    Pertinent Imaging:  No results found for this or any previous visit.  No results found for this or any previous visit.  No results found for this or any previous visit.  No results found for this or any previous visit.  No results  found for this or any previous visit.  No results found for this or any previous visit.  No results found for this or any previous visit.  No results found for this or any previous visit.   Assessment & Plan:    1. 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 Weak urinary stream -improved after TURP  3. Gross hematuria resolved   No follow-ups on file.  Belvie Clara, MD  [facility] [place]      [1]  Allergies Allergen Reactions   Amlodipine Besylate Swelling    Ankle swelling    "

## 2024-02-22 NOTE — Patient Instructions (Signed)
 Enlarged Prostate (Benign Prostatic Hyperplasia): What to Know  Benign prostatic hyperplasia, or BPH, means that the prostate has become bigger. This is also called enlarged prostate. BPH is not cancer. The prostate is a small gland that's in front of the rectum and below the bladder. It helps make semen. When the prostate gets bigger, it can press on the urethra, which is the tube that carries pee out of the bladder. This can make it hard for you to pee. If pee stays in your bladder, it can cause infections. This can hurt the bladder and kidneys. What are the causes? BPH happens because of age. But not all males have problems from an enlarged prostate. What increases the risk? Males over 59 years old are more likely to get BPH. What are the signs or symptoms? When you have BPH, you may: Feel like you can't fully empty your bladder. Need to pee often, day and night. Need to pee right away. Have trouble starting to pee, or you may dribble after you pee. Have a weak or slow pee stream. Get urinary tract infections (UTIs) often. How is this diagnosed? BPH is diagnosed based on your medical history, a physical exam, and your symptoms. Tests will also be done, such as: A digital rectal exam. In a rectal exam, your health care provider checks your prostate for any lumps or growths. Pee tests. A prostate specific antigen test, also called a PSA test. This blood test checks for prostate cancer. An ultrasound. A bladder scan. This checks how much pee is left in your bladder after you pee. Your provider may refer you to a specialist in kidney and prostate diseases (urologist). How is this treated? Treatment for BPH depends on how bad your symptoms are. It may include: Observation and yearly exams. If your symptoms are mild, this may be all you need. Watchful waiting. This means your provider will check your BPH regularly but won't treat it right away. Medicines to shrink the prostate, or to relax  the muscle in the prostate. Surgery if your symptoms are very bad. This may include: Prostatectomy. The prostate tissue is removed. Transurethral resection of the prostate (TURP). A part of the prostate is removed through the penis. This will remove the blockage. Transurethral incision of the prostate (TUIP). Small cuts are made in the prostate. This lessens the pressure. Transurethral microwave thermotherapy (TUMT). This uses microwaves to create heat. The heat destroys and removes a small amount of prostate tissue. Transurethral needle ablation (TUNA). This uses radio waves to destroy and remove a small amount of prostate tissue. Interstitial laser coagulation (ILC). This uses a laser to destroy and remove a small amount of prostate tissue. Transurethral electrovaporization (TUVP). This uses electrodes to destroy and remove a small amount of prostate tissue. Prostatic urethral lift. This uses an implant to push the lobes of the prostate away from the urethra. Follow these instructions at home: Take your medicines only as told. Watch your symptoms for any changes. Tell your provider if you see changes or if something worries you. Avoid drinking lots of liquids before going to bed or going out. Avoid or reduce how much caffeine or alcohol you drink. Give yourself time when you pee. Contact a health care provider if: You have unexplained back pain. Your symptoms don't get better with treatment. You have side effects from your medicine. Your pee is very dark or smells bad. Your lower belly swells and you can't pee. You have a fever or chills. Get help  right away if: You suddenly can't pee. You feel light-headed or very dizzy, or you faint. You see a lot of blood or clots in your pee. Your peeing problems get worse. You have very bad pain in your lower back or flank. The flank is the side of your body between the ribs and the hip. These symptoms may be an emergency. Call 911 right away. Do  not wait to see if the symptoms will go away. Do not drive yourself to the hospital. This information is not intended to replace advice given to you by your health care provider. Make sure you discuss any questions you have with your health care provider. Document Revised: 05/15/2023 Document Reviewed: 05/15/2023 Elsevier Patient Education  2025 [employer].

## 2024-03-05 ENCOUNTER — Ambulatory Visit: Payer: BC Managed Care – PPO | Admitting: Adult Health
